# Patient Record
Sex: Female | Born: 1970 | Race: Black or African American | Hispanic: No | Marital: Single | Smoking: Former smoker
Health system: Southern US, Community
[De-identification: ages and names within clinical notes are randomized; demographics above are authoritative.]

## PROBLEM LIST (undated history)

## (undated) DIAGNOSIS — D649 Anemia, unspecified: Secondary | ICD-10-CM

## (undated) DIAGNOSIS — M1990 Unspecified osteoarthritis, unspecified site: Secondary | ICD-10-CM

## (undated) DIAGNOSIS — I10 Essential (primary) hypertension: Secondary | ICD-10-CM

## (undated) DIAGNOSIS — F32A Depression, unspecified: Secondary | ICD-10-CM

## (undated) DIAGNOSIS — R5383 Other fatigue: Secondary | ICD-10-CM

## (undated) DIAGNOSIS — F419 Anxiety disorder, unspecified: Secondary | ICD-10-CM

## (undated) DIAGNOSIS — D219 Benign neoplasm of connective and other soft tissue, unspecified: Secondary | ICD-10-CM

## (undated) DIAGNOSIS — F329 Major depressive disorder, single episode, unspecified: Secondary | ICD-10-CM

## (undated) DIAGNOSIS — B009 Herpesviral infection, unspecified: Secondary | ICD-10-CM

## (undated) HISTORY — PX: REDUCTION MAMMAPLASTY: SUR839

## (undated) HISTORY — PX: ABDOMINAL HYSTERECTOMY: SHX81

## (undated) HISTORY — DX: Major depressive disorder, single episode, unspecified: F32.9

## (undated) HISTORY — DX: Benign neoplasm of connective and other soft tissue, unspecified: D21.9

## (undated) HISTORY — DX: Unspecified osteoarthritis, unspecified site: M19.90

## (undated) HISTORY — DX: Anemia, unspecified: D64.9

## (undated) HISTORY — DX: Herpesviral infection, unspecified: B00.9

## (undated) HISTORY — DX: Depression, unspecified: F32.A

## (undated) HISTORY — DX: Anxiety disorder, unspecified: F41.9

---
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
## 1998-07-06 HISTORY — PX: BURN DEBRIDEMENT SURGERY: SHX582

## 2016-02-15 ENCOUNTER — Emergency Department (HOSPITAL_COMMUNITY)
Admission: EM | Admit: 2016-02-15 | Discharge: 2016-02-15 | Disposition: A | Discharge status: Home / Self Care | Payer: Self-pay | Attending: Emergency Medicine | Admitting: Emergency Medicine

## 2016-02-15 ENCOUNTER — Encounter (HOSPITAL_COMMUNITY): Payer: Self-pay | Admitting: Emergency Medicine

## 2016-02-15 DIAGNOSIS — R600 Localized edema: Secondary | ICD-10-CM | POA: Insufficient documentation

## 2016-02-15 DIAGNOSIS — M79606 Pain in leg, unspecified: Secondary | ICD-10-CM

## 2016-02-15 DIAGNOSIS — I10 Essential (primary) hypertension: Secondary | ICD-10-CM | POA: Insufficient documentation

## 2016-02-15 DIAGNOSIS — R609 Edema, unspecified: Secondary | ICD-10-CM

## 2016-02-15 HISTORY — DX: Essential (primary) hypertension: I10

## 2016-02-15 MED ORDER — FUROSEMIDE 20 MG PO TABS: 20.0000 mg | ORAL_TABLET | Freq: Every day | ORAL | 0 refills | Status: DC

## 2016-02-15 NOTE — Discharge Instructions (Signed)
Eat a low salt diet to help with symptoms. Elevate your legs above the level of your heart to help with swelling. Continue taking your usual home medications, including the meloxicam for pain, and all home blood pressure medications. Use additional tylenol as needed for pain. Take lasix as directed to help with swelling. Follow up with your regular doctor in 3 days for recheck of symptoms and ongoing management of your symptoms. Return to the ER for changes or worsening symptoms.
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
## 2016-02-15 NOTE — ED Triage Notes (Signed)
Patient states that since Wed-Thurs having lower leg pain and having swelling in bilat feet.  Patient recently traveled via airplane.  Patient states that has tried to elevate but still having swelling.

## 2016-03-10 ENCOUNTER — Other Ambulatory Visit (HOSPITAL_COMMUNITY): Payer: Self-pay | Admitting: Nurse Practitioner

## 2016-03-10 ENCOUNTER — Ambulatory Visit (HOSPITAL_COMMUNITY)
Admission: RE | Admit: 2016-03-10 | Discharge: 2016-03-10 | Disposition: A | Discharge status: Home / Self Care | Payer: Self-pay | Source: Ambulatory Visit | Attending: Vascular Surgery | Admitting: Vascular Surgery

## 2016-03-10 DIAGNOSIS — M79661 Pain in right lower leg: Secondary | ICD-10-CM

## 2016-11-03 ENCOUNTER — Other Ambulatory Visit: Payer: Self-pay | Admitting: Nurse Practitioner

## 2016-11-03 ENCOUNTER — Other Ambulatory Visit (HOSPITAL_COMMUNITY)
Admission: RE | Admit: 2016-11-03 | Discharge: 2016-11-03 | Disposition: A | Discharge status: Home / Self Care | Payer: No Typology Code available for payment source | Source: Ambulatory Visit | Attending: Nurse Practitioner | Admitting: Nurse Practitioner

## 2016-11-03 DIAGNOSIS — Z1151 Encounter for screening for human papillomavirus (HPV): Secondary | ICD-10-CM | POA: Insufficient documentation

## 2016-11-03 DIAGNOSIS — Z01419 Encounter for gynecological examination (general) (routine) without abnormal findings: Secondary | ICD-10-CM | POA: Diagnosis present

## 2016-11-05 LAB — CYTOLOGY - PAP
Diagnosis: NEGATIVE
HPV: DETECTED — AB

## 2017-09-08 ENCOUNTER — Other Ambulatory Visit: Payer: Self-pay | Admitting: Nurse Practitioner

## 2017-09-08 DIAGNOSIS — Z1231 Encounter for screening mammogram for malignant neoplasm of breast: Secondary | ICD-10-CM

## 2017-10-05 ENCOUNTER — Ambulatory Visit
Admission: RE | Admit: 2017-10-05 | Discharge: 2017-10-05 | Disposition: A | Discharge status: Home / Self Care | Payer: BC Managed Care – PPO | Source: Ambulatory Visit | Attending: Nurse Practitioner | Admitting: Nurse Practitioner

## 2017-10-05 DIAGNOSIS — Z1231 Encounter for screening mammogram for malignant neoplasm of breast: Secondary | ICD-10-CM

## 2017-10-06 ENCOUNTER — Ambulatory Visit: Payer: No Typology Code available for payment source

## 2017-10-18 ENCOUNTER — Emergency Department (HOSPITAL_BASED_OUTPATIENT_CLINIC_OR_DEPARTMENT_OTHER): Payer: BC Managed Care – PPO

## 2017-10-18 ENCOUNTER — Encounter (HOSPITAL_BASED_OUTPATIENT_CLINIC_OR_DEPARTMENT_OTHER): Payer: Self-pay | Admitting: Emergency Medicine

## 2017-10-18 ENCOUNTER — Emergency Department (HOSPITAL_BASED_OUTPATIENT_CLINIC_OR_DEPARTMENT_OTHER)
Admission: EM | Admit: 2017-10-18 | Discharge: 2017-10-18 | Disposition: A | Discharge status: Home / Self Care | Payer: BC Managed Care – PPO | Attending: Emergency Medicine | Admitting: Emergency Medicine

## 2017-10-18 ENCOUNTER — Other Ambulatory Visit: Payer: Self-pay

## 2017-10-18 DIAGNOSIS — Y929 Unspecified place or not applicable: Secondary | ICD-10-CM | POA: Diagnosis not present

## 2017-10-18 DIAGNOSIS — I10 Essential (primary) hypertension: Secondary | ICD-10-CM | POA: Insufficient documentation

## 2017-10-18 DIAGNOSIS — Y9389 Activity, other specified: Secondary | ICD-10-CM | POA: Diagnosis not present

## 2017-10-18 DIAGNOSIS — S301XXA Contusion of abdominal wall, initial encounter: Secondary | ICD-10-CM | POA: Insufficient documentation

## 2017-10-18 DIAGNOSIS — Y998 Other external cause status: Secondary | ICD-10-CM | POA: Insufficient documentation

## 2017-10-18 DIAGNOSIS — S3991XA Unspecified injury of abdomen, initial encounter: Secondary | ICD-10-CM | POA: Diagnosis present

## 2017-10-18 LAB — COMPREHENSIVE METABOLIC PANEL
ALT: 16 U/L (ref 14–54)
AST: 23 U/L (ref 15–41)
Albumin: 4.2 g/dL (ref 3.5–5.0)
Alkaline Phosphatase: 47 U/L (ref 38–126)
Anion gap: 10 (ref 5–15)
BUN: 10 mg/dL (ref 6–20)
CO2: 25 mmol/L (ref 22–32)
Calcium: 9.2 mg/dL (ref 8.9–10.3)
Chloride: 104 mmol/L (ref 101–111)
Creatinine, Ser: 0.79 mg/dL (ref 0.44–1.00)
GFR calc Af Amer: >60 mL/min (ref >60)
GFR calc non Af Amer: >60 mL/min (ref >60)
Glucose, Bld: 103 mg/dL — ABNORMAL HIGH (ref 65–99)
Potassium: 3.7 mmol/L (ref 3.5–5.1)
Sodium: 139 mmol/L (ref 135–145)
Total Bilirubin: 0.9 mg/dL (ref 0.3–1.2)
Total Protein: 7.5 g/dL (ref 6.5–8.1)

## 2017-10-18 LAB — URINALYSIS, MICROSCOPIC (REFLEX)

## 2017-10-18 LAB — URINALYSIS, ROUTINE W REFLEX MICROSCOPIC
Bilirubin Urine: NEGATIVE
Glucose, UA: NEGATIVE mg/dL
Ketones, ur: NEGATIVE mg/dL
Leukocytes, UA: NEGATIVE
Nitrite: NEGATIVE
Protein, ur: NEGATIVE mg/dL
Specific Gravity, Urine: 1.02 (ref 1.005–1.030)
pH: 6 (ref 5.0–8.0)

## 2017-10-18 LAB — PREGNANCY, URINE: Preg Test, Ur: NEGATIVE

## 2017-10-18 MED ORDER — NAPROXEN 500 MG PO TABS: 500.0000 mg | ORAL_TABLET | Freq: Two times a day (BID) | ORAL | 0 refills | Status: DC

## 2017-10-18 MED ORDER — METHOCARBAMOL 500 MG PO TABS: 500.0000 mg | ORAL_TABLET | Freq: Two times a day (BID) | ORAL | 0 refills | Status: DC

## 2017-10-18 MED ORDER — METHOCARBAMOL 500 MG PO TABS
500.0000 mg | ORAL_TABLET | Freq: Once | ORAL | Status: AC
  Administered 2017-10-18: 500 mg via ORAL

## 2017-10-18 MED ORDER — METHOCARBAMOL 500 MG PO TABS
ORAL_TABLET | ORAL | Status: AC
  Filled 2017-10-18: qty 1

## 2017-10-18 MED ORDER — IOPAMIDOL (ISOVUE-300) INJECTION 61%
100.0000 mL | Freq: Once | INTRAVENOUS | Status: AC | PRN
  Administered 2017-10-18: 100 mL via INTRAVENOUS
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
## 2017-10-18 NOTE — ED Notes (Signed)
Patient transported to CT 
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
## 2017-10-18 NOTE — ED Notes (Signed)
ED Provider at bedside. 
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
## 2018-01-18 ENCOUNTER — Other Ambulatory Visit: Payer: Self-pay | Admitting: Nurse Practitioner

## 2018-06-10 NOTE — Progress Notes (Signed)
Assessment and Plan:  [place] was seen today for establish care and mass.  Diagnoses and all orders for this visit:   Encounter to establish care with new doctor  Hypertension, unspecified type Monitor blood pressure at home; call if consistently over 130/80 Continue DASH diet.   Reminder to go to the ER if any CP, SOB, nausea, dizziness, severe HA, changes vision/speech, left arm numbness and tingling and jaw pain.  Uterine leiomyoma, unspecified location Followed by GYN, discussing surgery  Vitamin D deficiency Continue supplementation Check vitamin D level at next OV  Morbid obesity with BMI of 50.0-59.9, adult ([place]) Long discussion about weight loss, diet, and exercise Recommended diet heavy in fruits and veggies and low in animal meats, cheeses, and dairy products, appropriate calorie intake Patient will work on working with Physiological scientist, portion controll Discussed appropriate weight for height  Follow up at next visit  Mass of soft tissue of LLE [place] simple lipoma vs cellulitis, no signs of infection or distinct lesion, will obtain soft tissue ultrasound to determine  Patient reassured, monitor, biopsy/surgical excision if further concern  Further disposition pending results of labs. Discussed med's effects and SE's.   Over 30 minutes of exam, counseling, chart review, and critical decision making was performed.   Future Appointments  Date Time Provider [place]  12/14/2018  3:45 PM Liane Comber, NP [facility] None    ------------------------------------------------------------------------------------------------------------------   HPI BP 122/78   Pulse 84   Temp (!) 97.3 F (36.3 C)   Ht 5' 5.25" (1.657 m)   Wt (!) 322 lb 6.4 oz (146.2 kg)   SpO2 97%   BMI 53.24 kg/m   47 y.o.female presents to establish care as she is new to this office, moved from [place], has been with [facility] (Dr. Homero Fellers), seeing GYN NP at [facility], and for  evaluation of acute complaint of lump to thigh that she noted approx 6 weeks ago, was pointed out to her by her massage therapist and personal trainer (currently working on weight loss). She denies tenderness, changes in sensation, changes in mobility.   She is on treatment for htn, depression/anxiety (has been well managed with PRN xanax. Has been on daily agents on and off, but hasn't been on in the past 7-8 years. She has been on zoloft in the past, has also taken wellbutrin in the past, but feels she is doing well without this.  She is a Occupational hygienist for PhD students at [employer];   Last CPE was 03/2018. Reports labwork was unremakable.    Her blood pressure has been controlled at home, today their BP is BP: 122/78  She does workout. She denies chest pain, shortness of breath, dizziness.  Patient is on Vitamin D supplement, taking ? 1000 IU daily, unsure of dose No results found for: VD25OH    BMI is Body mass index is 53.24 kg/m., she has been working on diet and exercise. Wt Readings from Last 3 Encounters:  06/13/18 (!) 322 lb 6.4 oz (146.2 kg)  10/18/17 300 lb (136.1 kg)  02/15/16 (!) 320 lb (145.2 kg)     Past Medical History:  Diagnosis Date  . Anxiety and depression   . Herpes   . Hypertension      No Known Allergies  Current Outpatient Medications on File Prior to Visit  Medication Sig  . ALPRAZolam (XANAX) 0.5 MG tablet Take 0.5 mg by mouth as needed for anxiety.  Marland Kitchen amLODipine (NORVASC) 2.5 MG tablet Take 2.5  mg by mouth daily.  . CHOLECALCIFEROL PO Take 1,000 Units by mouth daily.  Marland Kitchen losartan (COZAAR) 25 MG tablet Take 25 mg by mouth daily.   No current facility-administered medications on file prior to visit.     ROS: Review of Systems  Constitutional: Negative for malaise/fatigue and weight loss.  HENT: Negative for hearing loss and tinnitus.   Eyes: Negative for blurred vision and double vision.  Respiratory: Negative for cough, shortness of breath  and wheezing.   Cardiovascular: Negative for chest pain, palpitations, orthopnea, claudication and leg swelling.  Gastrointestinal: Negative for abdominal pain, blood in stool, constipation, diarrhea, heartburn, melena, nausea and vomiting.  Genitourinary: Negative.   Musculoskeletal: Negative for joint pain and myalgias.  Skin: Negative for rash.  Neurological: Negative for dizziness, tingling, sensory change, weakness and headaches.  Endo/Heme/Allergies: Negative for polydipsia.  Psychiatric/Behavioral: Negative.   All other systems reviewed and are negative.   Physical Exam:  BP 122/78   Pulse 84   Temp (!) 97.3 F (36.3 C)   Ht 5' 5.25" (1.657 m)   Wt (!) 322 lb 6.4 oz (146.2 kg)   SpO2 97%   BMI 53.24 kg/m   General Appearance: Well nourished, morbidly obese, in no apparent distress. Eyes: PERRLA, EOMs, conjunctiva no swelling or erythema Sinuses: No Frontal/maxillary tenderness ENT/Mouth: Ext aud canals clear, TMs without erythema, bulging. No erythema, swelling, or exudate on post pharynx.  Tonsils not swollen or erythematous. Hearing normal.  Neck: Supple, thyroid normal.  Respiratory: Respiratory effort normal, BS equal bilaterally without rales, rhonchi, wheezing or stridor.  Cardio: RRR with no MRGs. Brisk peripheral pulses without edema.  Abdomen: Soft, obese abdomen, + BS.  Non tender, no guarding, rebound, hernias, masses. Lymphatics: Non tender without lymphadenopathy.  Musculoskeletal: Full ROM, 5/5 strength, normal gait. Soft tissue growth/enlargement without erythema/discharge, mildly tender, non-distinct border approx 5cm x 8 cm area to left lateral thigh Skin: Warm, dry without rashes, lesions, ecchymosis.  Neuro: Cranial nerves intact. Normal muscle tone, no cerebellar symptoms. Sensation intact.  Psych: Awake and oriented X 3, normal affect, Insight and Judgment appropriate.     [name], NP 3:26 PM [facility]

## 2018-06-13 ENCOUNTER — Encounter: Payer: Self-pay | Admitting: Adult Health

## 2018-06-13 ENCOUNTER — Ambulatory Visit: Payer: BC Managed Care – PPO | Admitting: Adult Health

## 2018-06-13 VITALS — BP 122/78 | HR 84 | Temp 97.3°F | Ht 65.25 in | Wt 322.4 lb

## 2018-06-13 DIAGNOSIS — I10 Essential (primary) hypertension: Secondary | ICD-10-CM | POA: Insufficient documentation

## 2018-06-13 DIAGNOSIS — E559 Vitamin D deficiency, unspecified: Secondary | ICD-10-CM | POA: Diagnosis not present

## 2018-06-13 DIAGNOSIS — Z7689 Persons encountering health services in other specified circumstances: Secondary | ICD-10-CM | POA: Diagnosis not present

## 2018-06-13 DIAGNOSIS — D259 Leiomyoma of uterus, unspecified: Secondary | ICD-10-CM | POA: Diagnosis not present

## 2018-06-13 DIAGNOSIS — M7989 Other specified soft tissue disorders: Secondary | ICD-10-CM

## 2018-06-13 DIAGNOSIS — R2242 Localized swelling, mass and lump, left lower limb: Secondary | ICD-10-CM

## 2018-06-13 DIAGNOSIS — Z6841 Body Mass Index (BMI) 40.0 and over, adult: Secondary | ICD-10-CM

## 2018-06-13 HISTORY — DX: Leiomyoma of uterus, unspecified: D25.9

## 2018-06-13 NOTE — Patient Instructions (Signed)
Lipoma A lipoma is a noncancerous (benign) tumor that is made up of fat cells. This is a very common type of soft-tissue growth. Lipomas are usually found under the skin (subcutaneous). They may occur in any tissue of the body that contains fat. Common areas for lipomas to appear include the back, shoulders, buttocks, and thighs. Lipomas grow slowly, and they are usually painless. Most lipomas do not cause problems and do not require treatment. What are the causes? The cause of this condition is not known. What increases the risk? This condition is more likely to develop in:  People who are 40-60 years old.  People who have a family history of lipomas.  What are the signs or symptoms? A lipoma usually appears as a small, round bump under the skin. It may feel soft or rubbery, but the firmness can vary. Most lipomas are not painful. However, a lipoma may become painful if it is located in an area where it pushes on nerves. How is this diagnosed? A lipoma can usually be diagnosed with a physical exam. You may also have tests to confirm the diagnosis and to rule out other conditions. Tests may include:  Imaging tests, such as a CT scan or MRI.  Removal of a tissue sample to be looked at under a microscope (biopsy).  How is this treated? Treatment is not needed for small lipomas that are not causing problems. If a lipoma continues to get bigger or it causes problems, removal is often the best option. Lipomas can also be removed to improve appearance. Removal of a lipoma is usually done with a surgery in which the fatty cells and the surrounding capsule are removed. Most often, a medicine that numbs the area (local anesthetic) is used for this procedure. Follow these instructions at home:  Keep all follow-up visits as directed by your health care provider. This is important. Contact a health care provider if:  Your lipoma becomes larger or hard.  Your lipoma becomes painful, red, or  increasingly swollen. These could be signs of infection or a more serious condition. This information is not intended to replace advice given to you by your health care provider. Make sure you discuss any questions you have with your health care provider. Document Released: 06/12/2002 Document Revised: 11/28/2015 Document Reviewed: 06/18/2014 Elsevier Interactive Patient Education  2018 Elsevier Inc.  

## 2018-06-16 ENCOUNTER — Telehealth: Payer: Self-pay

## 2018-06-16 NOTE — Telephone Encounter (Signed)
Patient is requesting a referral to have Colonoscopy done.

## 2018-06-17 NOTE — Telephone Encounter (Signed)
Patient notified and is going to call her insurance company first and will let us know

## 2018-07-01 ENCOUNTER — Telehealth: Payer: Self-pay | Admitting: *Deleted

## 2018-07-01 NOTE — Telephone Encounter (Signed)
The 1st part of Dec patient had discussed getting a colonoscopy with [facility]. Patient was to call her insurance & get back to [place]. Patient wants to get this done. Says it is fully covered

## 2018-07-04 ENCOUNTER — Other Ambulatory Visit: Payer: Self-pay | Admitting: Adult Health

## 2018-07-04 DIAGNOSIS — Z1211 Encounter for screening for malignant neoplasm of colon: Secondary | ICD-10-CM

## 2018-07-07 ENCOUNTER — Telehealth: Payer: Self-pay | Admitting: Adult Health

## 2018-07-07 NOTE — Telephone Encounter (Signed)
error 

## 2018-07-08 ENCOUNTER — Telehealth: Payer: Self-pay | Admitting: Adult Health

## 2018-07-08 ENCOUNTER — Encounter: Payer: Self-pay | Admitting: Adult Health

## 2018-07-08 ENCOUNTER — Encounter: Payer: Self-pay | Admitting: Internal Medicine

## 2018-07-08 ENCOUNTER — Telehealth: Payer: Self-pay | Admitting: *Deleted

## 2018-07-08 ENCOUNTER — Other Ambulatory Visit: Payer: Self-pay | Admitting: Adult Health

## 2018-07-08 DIAGNOSIS — M7989 Other specified soft tissue disorders: Secondary | ICD-10-CM

## 2018-07-08 DIAGNOSIS — R2240 Localized swelling, mass and lump, unspecified lower limb: Principal | ICD-10-CM

## 2018-07-08 MED ORDER — DICLOFENAC EPOLAMINE 1.3 % TD PTCH: 1.0000 | MEDICATED_PATCH | Freq: Two times a day (BID) | TRANSDERMAL | 2 refills | Status: DC

## 2018-07-08 NOTE — Telephone Encounter (Signed)
spoke with patient about [place] result, she does state that the area is pain at times and numb. She would like to have an MRI to investigate further. She also states she used a piece of  a Flector patch on lipoma with relief. requesting rx. Please advise your recommendations.

## 2018-07-08 NOTE — Progress Notes (Signed)
New patient to our office first seen on 06/13/2018 with poorly localized tender soft tissue mass to left lateral thigh persisting for over 8 weeks, unclear etiology on exam in office and was pursued by [place] on 12/16 which found  Normal appearance of subcutaneous fat and underlying fascial margins with no evidence for focal mass, cystic region, nor shadowing to suggest calcification. Visualized muscle belly fibers are normal. No focal fluid collection. Recommendation from radiologist was to consider MRI if symptoms persisted; patient reports intermittent localized pain and numbness and wishing to pursue MRI at this time. She reported flector patches as helpful for comfort and this was ordered per her request. She was called to discuss, and advised she may alternately try OTC lidocaine patches if needed. If MRI not cleared by insurance, will alternately refer to general surgery for evaluation.

## 2018-07-08 NOTE — Telephone Encounter (Signed)
Pt is calling asking results of u/s on thigh  has not gotten a call    Also asking for maybe a flector patch had 1 used it on spot said it helped asking for a rx?    [facility]  [address]

## 2018-07-18 ENCOUNTER — Telehealth: Payer: Self-pay | Admitting: *Deleted

## 2018-07-18 NOTE — Telephone Encounter (Signed)
Patient is scheduled for a direct screening colonoscopy. Referred by her GYN. Her last BMI was on 06/13/2018=53.26, & last weight 322lbs. Hx of HTN and Uterine fibroid. Ok for direct hospital colonoscopy due to BMI over 50 or Office visit 1st? Please advise. Thank you, Robbin pv

## 2018-07-18 NOTE — Telephone Encounter (Signed)
[place] for direct to hospital procedure for screening

## 2018-07-18 NOTE — Telephone Encounter (Signed)
Noted  

## 2018-07-19 ENCOUNTER — Other Ambulatory Visit: Payer: Self-pay

## 2018-07-19 DIAGNOSIS — Z1211 Encounter for screening for malignant neoplasm of colon: Secondary | ICD-10-CM

## 2018-07-19 NOTE — Telephone Encounter (Signed)
Pt scheduled for colon at [facility] 08/15/18@8 :30am, case [id number]. Please let pt know of new appt date and time at previsit.

## 2018-07-19 NOTE — Telephone Encounter (Signed)
Patient notified of [facility] appointment for 2/10 at 830 am. Patient also aware of her PV appointment on 07/29/2018.

## 2018-07-29 ENCOUNTER — Ambulatory Visit (AMBULATORY_SURGERY_CENTER): Payer: BC Managed Care – PPO | Admitting: *Deleted

## 2018-07-29 VITALS — Ht 65.0 in | Wt 317.0 lb

## 2018-07-29 DIAGNOSIS — Z1211 Encounter for screening for malignant neoplasm of colon: Secondary | ICD-10-CM

## 2018-07-29 MED ORDER — NA SULFATE-K SULFATE-MG SULF 17.5-3.13-1.6 GM/177ML PO SOLN: ORAL | 0 refills | Status: DC

## 2018-07-29 NOTE — Progress Notes (Signed)
Patient denies any allergies to eggs or soy. Patient denies any problems with anesthesia/sedation. Patient denies any oxygen use at home. Patient is taking Phentermine but has already stopped this medication and is aware not take any until after colonoscopy. Patient denies taking any blood thinners. EMMI education offered, pt declined. Colonoscopy handout given.

## 2018-08-12 ENCOUNTER — Encounter: Payer: BC Managed Care – PPO | Admitting: Internal Medicine

## 2018-08-14 ENCOUNTER — Encounter (HOSPITAL_COMMUNITY): Payer: Self-pay | Admitting: Anesthesiology

## 2018-08-14 NOTE — Anesthesia Preprocedure Evaluation (Addendum)
Anesthesia Evaluation  Patient identified by MRN, date of birth, ID band Patient awake    Reviewed: Allergy & Precautions, NPO status , Patient's Chart, lab work & pertinent test results  Airway Mallampati: II  TM Distance: >3 FB Neck ROM: Full    Dental no notable dental hx. (+) Teeth Intact   Pulmonary neg pulmonary ROS,    Pulmonary exam normal breath sounds clear to auscultation       Cardiovascular hypertension, Pt. on medications Normal cardiovascular exam Rhythm:Regular Rate:Normal     Neuro/Psych PSYCHIATRIC DISORDERS Anxiety Depression negative neurological ROS     GI/Hepatic Neg liver ROS, For screening colonoscopy   Endo/Other  Morbid obesity  Renal/GU negative Renal ROS  negative genitourinary   Musculoskeletal negative musculoskeletal ROS (+)   Abdominal (+) + obese,   Peds  Hematology  (+) anemia ,   Anesthesia Other Findings   Reproductive/Obstetrics HSV                            Anesthesia Physical Anesthesia Plan  ASA: III  Anesthesia Plan: MAC   Post-op Pain Management:    Induction:   PONV Risk Score and Plan: 2 and Propofol infusion, Ondansetron and Treatment may vary due to age or medical condition  Airway Management Planned: Natural Airway and Simple Face Mask  Additional Equipment:   Intra-op Plan:   Post-operative Plan:   Informed Consent: I have reviewed the patients History and Physical, chart, labs and discussed the procedure including the risks, benefits and alternatives for the proposed anesthesia with the patient or authorized representative who has indicated his/her understanding and acceptance.     Dental advisory given  Plan Discussed with: CRNA and Surgeon  Anesthesia Plan Comments:        Anesthesia Quick Evaluation

## 2018-08-15 ENCOUNTER — Ambulatory Visit (HOSPITAL_COMMUNITY): Payer: BC Managed Care – PPO | Admitting: Anesthesiology

## 2018-08-15 ENCOUNTER — Encounter (HOSPITAL_COMMUNITY)
Admission: RE | Disposition: A | Discharge status: Home / Self Care | Payer: Self-pay | Source: Home / Self Care | Attending: Internal Medicine

## 2018-08-15 ENCOUNTER — Other Ambulatory Visit: Payer: Self-pay

## 2018-08-15 ENCOUNTER — Ambulatory Visit (HOSPITAL_COMMUNITY)
Admission: RE | Admit: 2018-08-15 | Discharge: 2018-08-15 | Disposition: A | Discharge status: Home / Self Care | Payer: BC Managed Care – PPO | Attending: Internal Medicine | Admitting: Internal Medicine

## 2018-08-15 ENCOUNTER — Encounter (HOSPITAL_COMMUNITY): Payer: Self-pay | Admitting: Anesthesiology

## 2018-08-15 DIAGNOSIS — E669 Obesity, unspecified: Secondary | ICD-10-CM | POA: Insufficient documentation

## 2018-08-15 DIAGNOSIS — K5730 Diverticulosis of large intestine without perforation or abscess without bleeding: Secondary | ICD-10-CM

## 2018-08-15 DIAGNOSIS — F329 Major depressive disorder, single episode, unspecified: Secondary | ICD-10-CM | POA: Insufficient documentation

## 2018-08-15 DIAGNOSIS — Z6841 Body Mass Index (BMI) 40.0 and over, adult: Secondary | ICD-10-CM | POA: Insufficient documentation

## 2018-08-15 DIAGNOSIS — F419 Anxiety disorder, unspecified: Secondary | ICD-10-CM | POA: Insufficient documentation

## 2018-08-15 DIAGNOSIS — I10 Essential (primary) hypertension: Secondary | ICD-10-CM | POA: Insufficient documentation

## 2018-08-15 DIAGNOSIS — Z1211 Encounter for screening for malignant neoplasm of colon: Secondary | ICD-10-CM | POA: Diagnosis present

## 2018-08-15 HISTORY — PX: COLONOSCOPY WITH PROPOFOL: SHX5780

## 2018-08-15 SURGERY — COLONOSCOPY WITH PROPOFOL
Anesthesia: Monitor Anesthesia Care

## 2018-08-15 MED ORDER — LIDOCAINE HCL 1 % IJ SOLN
INTRAMUSCULAR | Status: DC | PRN
  Administered 2018-08-15: 30 mg via INTRADERMAL

## 2018-08-15 MED ORDER — LACTATED RINGERS IV SOLN
INTRAVENOUS | Status: DC | PRN
  Administered 2018-08-15: 07:00:00 via INTRAVENOUS

## 2018-08-15 MED ORDER — PROPOFOL 10 MG/ML IV BOLUS
INTRAVENOUS | Status: AC
  Filled 2018-08-15: qty 40

## 2018-08-15 MED ORDER — PROPOFOL 10 MG/ML IV BOLUS
INTRAVENOUS | Status: DC | PRN
  Administered 2018-08-15: 20 mg via INTRAVENOUS
  Administered 2018-08-15: 10 mg via INTRAVENOUS
  Administered 2018-08-15: 20 mg via INTRAVENOUS
  Administered 2018-08-15: 10 mg via INTRAVENOUS
  Administered 2018-08-15 (×5): 20 mg via INTRAVENOUS
  Administered 2018-08-15: 50 mg via INTRAVENOUS
  Administered 2018-08-15 (×3): 20 mg via INTRAVENOUS
  Administered 2018-08-15: 10 mg via INTRAVENOUS

## 2018-08-15 MED ORDER — SODIUM CHLORIDE 0.9 % IV SOLN: INTRAVENOUS | Status: DC

## 2018-08-15 MED ORDER — LACTATED RINGERS IV SOLN: INTRAVENOUS | Status: DC

## 2018-08-15 SURGICAL SUPPLY — 21 items

## 2018-08-15 NOTE — Transfer of Care (Signed)
Immediate Anesthesia Transfer of Care Note  Patient: Mary Banks  Procedure(s) Performed: COLONOSCOPY WITH PROPOFOL (N/A )  Patient Location: PACU and Endoscopy Unit  Anesthesia Type:MAC  Level of Consciousness: awake, alert  and oriented  Airway & Oxygen Therapy: Patient Spontanous Breathing and Patient connected to face mask oxygen  Post-op Assessment: Report given to RN and Post -op Vital signs reviewed and stable  Post vital signs: Reviewed and stable  Last Vitals:  Vitals Value Taken Time  BP 113/66 08/15/2018  9:13 AM  Temp    Pulse 76 08/15/2018  9:14 AM  Resp 16 08/15/2018  9:14 AM  SpO2 100 % 08/15/2018  9:14 AM  Vitals shown include unvalidated device data.  Last Pain:  Vitals:   08/15/18 0732  TempSrc: Oral  PainSc: 0-No pain         Complications: No apparent anesthesia complications

## 2018-08-15 NOTE — Op Note (Signed)
[facility] Patient Name: Mary Banks Procedure Date: 08/15/2018 MRN: [id number] Attending MD: Jerene Bears , MD Date of Birth: 12-03-1970 CSN: [id number] Age: 48 Admit Type: Outpatient Procedure:                Colonoscopy Indications:              Screening for colorectal malignant neoplasm, This                            is the patient's first colonoscopy Providers:                Lajuan Lines. Hilarie Fredrickson, MD, Zenon Mayo, RN, Cherylynn Ridges,                            Technician, Karis Juba, CRNA Referring MD:             Cherlyn Roberts, MD Medicines:                Monitored Anesthesia Care Complications:            No immediate complications. Estimated Blood Loss:     Estimated blood loss: none. Procedure:                Pre-Anesthesia Assessment:                           - Prior to the procedure, a History and Physical                            was performed, and patient medications and                            allergies were reviewed. The patient's tolerance of                            previous anesthesia was also reviewed. The risks                            and benefits of the procedure and the sedation                            options and risks were discussed with the patient.                            All questions were answered, and informed consent                            was obtained. Prior Anticoagulants: The patient has                            taken no previous anticoagulant or antiplatelet                            agents. ASA Grade Assessment: III - A patient with  severe systemic disease. After reviewing the risks                            and benefits, the patient was deemed in                            satisfactory condition to undergo the procedure.                           After obtaining informed consent, the colonoscope                            was passed under direct vision. Throughout the                    procedure, the patient's blood pressure, pulse, and                            oxygen saturations were monitored continuously. The                            CF-HQ190L ([id number]) Olympus colonoscope was                            introduced through the anus and advanced to the                            cecum, identified by appendiceal orifice and                            ileocecal valve. The colonoscopy was performed                            without difficulty. The patient tolerated the                            procedure well. The quality of the bowel                            preparation was excellent. The ileocecal valve,                            appendiceal orifice, and rectum were photographed. Scope In: 8:55:29 AM Scope Out: 9:06:26 AM Scope Withdrawal Time: 0 hours 6 minutes 26 seconds  Total Procedure Duration: 0 hours 10 minutes 57 seconds  Findings:      The digital rectal exam was normal.      A few small-mouthed diverticula were found in the sigmoid colon.      The exam was otherwise without abnormality on direct and retroflexion       views. Impression:               - Diverticulosis in the sigmoid colon.                           - The examination was otherwise normal on direct  and retroflexion views. No polyps seen.                           - No specimens collected. Moderate Sedation:      N/A Recommendation:           - Patient has a contact number available for                            emergencies. The signs and symptoms of potential                            delayed complications were discussed with the                            patient. Return to normal activities tomorrow.                            Written discharge instructions were provided to the                            patient.                           - Resume previous diet.                           - Continue present medications.                            - Repeat colonoscopy in 10 years for screening                            purposes. Procedure Code(s):        --- Professional ---                           G8676, Colorectal cancer screening; colonoscopy on                            individual not meeting criteria for high risk Diagnosis Code(s):        --- Professional ---                           Z12.11, Encounter for screening for malignant                            neoplasm of colon                           K57.30, Diverticulosis of large intestine without                            perforation or abscess without bleeding CPT copyright 2018 American Medical Association. All rights reserved. The codes documented in this report are preliminary and upon coder review may  be revised to meet current compliance requirements. Jerene Bears, MD 08/15/2018 9:16:24  AM This report has been signed electronically. Number of Addenda: 0

## 2018-08-15 NOTE — Anesthesia Postprocedure Evaluation (Signed)
Anesthesia Post Note  Patient: Mary Banks  Procedure(s) Performed: COLONOSCOPY WITH PROPOFOL (N/A )     Patient location during evaluation: PACU Anesthesia Type: MAC Level of consciousness: awake and alert and oriented Pain management: pain level controlled Vital Signs Assessment: post-procedure vital signs reviewed and stable Respiratory status: spontaneous breathing, nonlabored ventilation and respiratory function stable Cardiovascular status: stable and blood pressure returned to baseline Postop Assessment: no apparent nausea or vomiting Anesthetic complications: no    Last Vitals:  Vitals:   08/15/18 0915 08/15/18 0925  BP: 113/66 106/66  Pulse: 76 73  Resp: 16 13  Temp: (!) 36.4 C   SpO2: 100% 100%    Last Pain:  Vitals:   08/15/18 0925  TempSrc:   PainSc: 0-No pain                 FOSTER,MICHAEL A.

## 2018-08-15 NOTE — Discharge Instructions (Addendum)
YOU HAD AN ENDOSCOPIC PROCEDURE TODAY: Refer to the procedure report and other information in the discharge instructions given to you for any specific questions about what was found during the examination. If this information does not answer your questions, please call [facility] office at [phone] to clarify.   YOU SHOULD EXPECT: Some feelings of bloating in the abdomen. Passage of more gas than usual. Walking can help get rid of the air that was put into your GI tract during the procedure and reduce the bloating. If you had a lower endoscopy (such as a colonoscopy or flexible sigmoidoscopy) you may notice spotting of blood in your stool or on the toilet paper. Some abdominal soreness may be present for a day or two, also.  DIET: Your first meal following the procedure should be a light meal and then it is ok to progress to your normal diet. A half-sandwich or bowl of soup is an example of a good first meal. Heavy or fried foods are harder to digest and may make you feel nauseous or bloated. Drink plenty of fluids but you should avoid alcoholic beverages for 24 hours. If you had a esophageal dilation, please see attached instructions for diet.    ACTIVITY: Your care partner should take you home directly after the procedure. You should plan to take it easy, moving slowly for the rest of the day. You can resume normal activity the day after the procedure however YOU SHOULD NOT DRIVE, use power tools, machinery or perform tasks that involve climbing or major physical exertion for 24 hours (because of the sedation medicines used during the test).   SYMPTOMS TO REPORT IMMEDIATELY: A gastroenterologist can be reached at any hour. Please call [phone]  for any of the following symptoms:  Following lower endoscopy (colonoscopy, flexible sigmoidoscopy) Excessive amounts of blood in the stool  Significant tenderness, worsening of abdominal pains  Swelling of the abdomen that is new, acute  Fever of 100 or  higher  Following upper endoscopy (EGD, EUS, ERCP, esophageal dilation) Vomiting of blood or coffee ground material  New, significant abdominal pain  New, significant chest pain or pain under the shoulder blades  Painful or persistently difficult swallowing  New shortness of breath  Black, tarry-looking or red, bloody stools  FOLLOW UP:  If any biopsies were taken you will be contacted by phone or by letter within the next 1-3 weeks. Call [phone]  if you have not heard about the biopsies in 3 weeks.  Please also call with any specific questions about appointments or follow up tests. YOU HAD AN ENDOSCOPIC PROCEDURE TODAY: Refer to the procedure report that was given to you for any specific questions about what was found during the examination.  If the procedure report does not answer your questions, please call your gastroenterologist to clarify.  YOU SHOULD EXPECT: Some feelings of bloating in the abdomen. Passage of more gas than usual.  Walking can help get rid of the air that was put into your GI tract during the procedure and reduce the bloating. If you had a lower endoscopy (such as a colonoscopy or flexible sigmoidoscopy) you may notice spotting of blood in your stool or on the toilet paper.   DIET: Your first meal following the procedure should be a light meal and then it is ok to progress to your normal diet.  A half-sandwich or bowl of soup is an example of a good first meal.  Heavy or fried foods are harder to digest and may  make you feel nasueas or bloated.  Drink plenty of fluids but you should avoid alcoholic beverages for 24 hours.  ACTIVITY: Your care partner should take you home directly after the procedure.  You should plan to take it easy, moving slowly for the rest of the day.  You can resume normal activity the day after the procedure however you should NOT DRIVE or use heavy machinery for 24 hours (because of the sedation medicines used during the test).    SYMPTOMS TO  REPORT IMMEDIATELY  A gastroenterologist can be reached at any hour.  Please call your doctor's office for any of the following symptoms:   Following lower endoscopy (colonoscopy, flexible sigmoidoscopy)  Excessive amounts of blood in the stool  Significant tenderness, worsening of abdominal pains  Swelling of the abdomen that is new, acute  Fever of 100 or higher  Following upper endoscopy (EGD, EUS, ERCP)  Vomiting of blood or coffee ground material  New, significant abdominal pain  New, significant chest pain or pain under the shoulder blades  Painful or persistently difficult swallowing  New shortness of breath  Black, tarry-looking stools  FOLLOW UP: If any biopsies were taken you will be contacted by phone or by letter within the next 1-3 weeks.  Call your gastroenterologist if you have not heard about the biopsies in 3 weeks.  Please also call your gastroenterologist's office with any specific questions about appointments or follow up tests.
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
## 2018-08-23 ENCOUNTER — Telehealth: Payer: Self-pay

## 2018-08-23 ENCOUNTER — Other Ambulatory Visit: Payer: Self-pay | Admitting: Adult Health

## 2018-08-23 ENCOUNTER — Telehealth: Payer: Self-pay | Admitting: Internal Medicine

## 2018-08-23 MED ORDER — FLUCONAZOLE 150 MG PO TABS: ORAL_TABLET | ORAL | 1 refills | Status: DC

## 2018-08-23 NOTE — Telephone Encounter (Signed)
Pt had colon done last week. States the day after the colon she was very energetic and did her work out, this was on Tuesday. By Wednesday evening she was back on her 1500 calorie diet. Reports over the weekend she felt ok but yesterday and today she has felt very tired and low energy. Pt wanted to know if Dr. Hilarie Fredrickson feels this is related to her procedure. Reports she did start her period on Sunday but is has been normal for her, not very heavy. Please advise.

## 2018-08-23 NOTE — Telephone Encounter (Signed)
Spoke with pt and she is aware.

## 2018-08-23 NOTE — Telephone Encounter (Signed)
Patient aware of rx being sent in and plans on calling Dr. Vena Rua office

## 2018-08-23 NOTE — Telephone Encounter (Signed)
Patient had a colonoscopy recently and developed a yeast infection since the procedure. Has also been feverish and really tired since the procedure, feeling a little better today. Patient would like to know if symptoms are normal and would like a prescription for the yeast infection.

## 2018-08-23 NOTE — Telephone Encounter (Signed)
I would not think this is related to her procedure.  If she continues to feel not like herself I would recommend that she see her PCP

## 2018-08-23 NOTE — Telephone Encounter (Signed)
Pt had colonoscopy 2.10.20.  Pt reported having no energy.  Please advise.

## 2018-08-29 ENCOUNTER — Encounter: Payer: Self-pay | Admitting: Adult Health Nurse Practitioner

## 2018-08-29 ENCOUNTER — Ambulatory Visit (INDEPENDENT_AMBULATORY_CARE_PROVIDER_SITE_OTHER): Payer: BC Managed Care – PPO | Admitting: Adult Health Nurse Practitioner

## 2018-08-29 VITALS — BP 110/72 | HR 79 | Temp 97.5°F | Wt 320.0 lb

## 2018-08-29 DIAGNOSIS — Z6841 Body Mass Index (BMI) 40.0 and over, adult: Secondary | ICD-10-CM | POA: Diagnosis not present

## 2018-08-29 DIAGNOSIS — R5383 Other fatigue: Secondary | ICD-10-CM

## 2018-08-29 DIAGNOSIS — I10 Essential (primary) hypertension: Secondary | ICD-10-CM | POA: Diagnosis not present

## 2018-08-29 DIAGNOSIS — E559 Vitamin D deficiency, unspecified: Secondary | ICD-10-CM | POA: Diagnosis not present

## 2018-08-29 DIAGNOSIS — D509 Iron deficiency anemia, unspecified: Secondary | ICD-10-CM

## 2018-08-29 NOTE — Progress Notes (Signed)
Assessment and Plan:  Mary Banks was seen today for fatigue.  Diagnoses and all orders for this visit:  Vitamin D deficiency Reports not consistant with supplement Encourage consistency, will check level.  -     VITAMIN D 25 Hydroxy (Vit-D Deficiency)  Iron deficiency anemia, unspecified iron deficiency anemia type Encouraged restarting supplement related to history Will check Iron panel  Fatigue, unspecified type -     CBC with Differential/Platelet -     COMPLETE METABOLIC PANEL WITH GFR -     Vitamin B12 -     TSH -     Iron,Total/Total Iron Binding Cap  BMI 50.0-59.9, adult (HCC) Discussed dietary and exercise modifications Encouraged positive behaviors   Hypertension, unspecified type Doing well on current regiment Taking Novasc 2.5mg  and Cozaar 25mg  and HCTZ 25mg . Continue to monitor Hypertension Continue medication: Monitor blood pressure at home; call if consistently over 130/80 Continue DASH diet.   Reminder to go to the ER if any CP, SOB, nausea, dizziness, severe HA, changes vision/speech, left arm numbness and tingling and jaw pain.  Discussed hospital precautions with patient and agrees with plan of care.  May contact office via phone [phone] or [place].   Follow up prn for with new or worsening symptoms   Further disposition pending results of labs. Discussed med's effects and SE's.   Over 30 minutes of exam, counseling, chart review, and critical decision making was performed.   Future Appointments  Date Time Provider [place]  12/14/2018  3:45 PM Liane Comber, NP [facility] None    ------------------------------------------------------------------------------------------------------------------   HPI 48 y.o.female presents for fatigue that started two weeks ago.  She reports she is active and has changed her diet and has improved her eating habits.  She has increase her fruits, vegetables and whole grains and avoiding carbohydrates  and fats.  She goes to the gym 4 days a week and rides the  Stationary bike.SHe also participates in body pump weight class.  She reports she is so tired and now just wants to sleep.  She said it is hard for her to stay wake while she is working.  She is unable to associate this post meal but from the time she wakes up in the morning.  She denies any snoring and reports she is sleeping through the night.  This started after her colonoscpy.  In addition to this she had vaginal bleeding for 7 days. This has since stopped. She is pre-menopausal.    In July she reports some heavy bleeding and she was treated by iron infusion for anemia.  She was bleeding for 7 weeks during this episode and was managed by Vilinda Boehringer and has history of fibroids. Report she was taking an iron supplement but has not been consistent with it lately.  She was taking fusion plus to help avoid constipation.   Past Medical History:  Diagnosis Date  . Anemia   . Anxiety and depression   . Herpes   . Hypertension      Allergies  Allergen Reactions  . Morphine Other (See Comments)    Other Reaction: hallucinations Hallucinations     Current Outpatient Medications on File Prior to Visit  Medication Sig  . ALPRAZolam (XANAX) 0.5 MG tablet Take 0.5 mg by mouth 2 (two) times daily as needed for anxiety.   Marland Kitchen amLODipine (NORVASC) 2.5 MG tablet Take 2.5 mg by mouth daily.  . Cholecalciferol 25 MCG (1000 UT) capsule Take 1,000 Units by mouth daily.   Marland Kitchen  diclofenac (FLECTOR) 1.3 % PTCH Place 1 patch onto the skin daily as needed (shoulder pain).  . Digestive Enzymes (DIGESTIVE ENZYME PO) Take 1 capsule by mouth daily.   . hydrochlorothiazide (HYDRODIURIL) 25 MG tablet Take 25 mg by mouth daily.   Marland Kitchen losartan (COZAAR) 25 MG tablet Take 25 mg by mouth daily.  . Multiple Vitamins-Minerals (MULTIVITAMIN ADULT PO) Take by mouth.  . Omega-3 Fatty Acids (FISH OIL) 1000 MG CAPS Take 1 capsule by mouth daily.  Marland Kitchen OVER THE COUNTER MEDICATION  Take 1 tablet by mouth daily. Adaptocrine XL daily   . phentermine 37.5 MG capsule Take 37.5 mg by mouth every morning.  . Turmeric 500 MG TABS Take 1 capsule by mouth daily.   . vitamin B-12 (CYANOCOBALAMIN) 1000 MCG tablet Take 1,000 mcg by mouth daily.  . fluconazole (DIFLUCAN) 150 MG tablet Take 1 tab once for yeast; repeat in 1 week if needed. (Patient not taking: Reported on 08/29/2018)   No current facility-administered medications on file prior to visit.     ROS: Review of Systems  Constitutional: Positive for malaise/fatigue. Negative for chills, diaphoresis, fever and weight loss.  HENT: Negative for congestion, ear discharge, ear pain, hearing loss, nosebleeds, sinus pain, sore throat and tinnitus.   Eyes: Negative for blurred vision, double vision, photophobia, pain, discharge and redness.  Respiratory: Negative for cough, hemoptysis, sputum production, shortness of breath, wheezing and stridor.   Cardiovascular: Negative for chest pain, palpitations, orthopnea, claudication, leg swelling and PND.  Gastrointestinal: Negative for abdominal pain, blood in stool, constipation, diarrhea, heartburn, melena, nausea and vomiting.  Genitourinary: Negative for dysuria, flank pain, frequency, hematuria and urgency.  Musculoskeletal: Negative for back pain, falls, joint pain, myalgias and neck pain.  Skin: Negative for itching and rash.  Neurological: Negative for dizziness, tingling, tremors, sensory change, speech change, focal weakness, seizures, loss of consciousness, weakness and headaches.  Endo/Heme/Allergies: Negative for environmental allergies and polydipsia. Does not bruise/bleed easily.  Psychiatric/Behavioral: Negative for depression, hallucinations, substance abuse and suicidal ideas. The patient is not nervous/anxious and does not have insomnia.      Physical Exam:  BP 110/72   Pulse 79   Temp (!) 97.5 F (36.4 C)   Wt (!) 320 lb (145.2 kg)   SpO2 98%   BMI 53.25 kg/m    General Appearance: Well nourished, in no apparent distress. Eyes: PERRLA, EOMs, conjunctiva no swelling or erythema Sinuses: No Frontal/maxillary tenderness ENT/Mouth: Ext aud canals clear, TMs without erythema, bulging. No erythema, swelling, or exudate on post pharynx.  Tonsils not swollen or erythematous. Hearing normal.  Neck: Supple, thyroid normal.  Respiratory: Respiratory effort normal, BS equal bilaterally without rales, rhonchi, wheezing or stridor.  Cardio: RRR with no MRGs. Brisk peripheral pulses without edema.  Abdomen: Soft, + BS.  Non tender, no guarding, rebound, hernias, masses. Lymphatics: Non tender without lymphadenopathy.  Musculoskeletal: Full ROM, 5/5 strength, normal gait.  Skin: Warm, dry without rashes, lesions, ecchymosis.  Neuro: Cranial nerves intact. Normal muscle tone, no cerebellar symptoms. Sensation intact.  Psych: Awake and oriented X 3, normal affect, Insight and Judgment appropriate.     Garnet Sierras, NP 3:53 PM [facility]

## 2018-08-29 NOTE — Patient Instructions (Signed)
We are checking your labs today and will call you with results in one to two days.

## 2018-08-30 ENCOUNTER — Other Ambulatory Visit: Payer: Self-pay | Admitting: Adult Health Nurse Practitioner

## 2018-08-30 ENCOUNTER — Other Ambulatory Visit: Payer: Self-pay

## 2018-08-30 DIAGNOSIS — E559 Vitamin D deficiency, unspecified: Secondary | ICD-10-CM

## 2018-08-30 LAB — COMPLETE METABOLIC PANEL WITH GFR
AG Ratio: 1.4 (calc) (ref 1.0–2.5)
ALT: 12 U/L (ref 6–29)
AST: 18 U/L (ref 10–35)
Albumin: 4.1 g/dL (ref 3.6–5.1)
Alkaline phosphatase (APISO): 64 U/L (ref 31–125)
BUN: 9 mg/dL (ref 7–25)
CO2: 28 mmol/L (ref 20–32)
Calcium: 9.6 mg/dL (ref 8.6–10.2)
Chloride: 103 mmol/L (ref 98–110)
Creat: 0.74 mg/dL (ref 0.50–1.10)
GFR, Est African American: 111 mL/min/{1.73_m2} (ref >=60)
GFR, Est Non African American: 96 mL/min/{1.73_m2} (ref >=60)
Globulin: 2.9 g/dL (calc) (ref 1.9–3.7)
Glucose, Bld: 91 mg/dL (ref 65–99)
Potassium: 4.3 mmol/L (ref 3.5–5.3)
Sodium: 139 mmol/L (ref 135–146)
Total Bilirubin: 0.5 mg/dL (ref 0.2–1.2)
Total Protein: 7 g/dL (ref 6.1–8.1)

## 2018-08-30 LAB — CBC WITH DIFFERENTIAL/PLATELET
Absolute Monocytes: 530 cells/uL (ref 200–950)
Basophils Absolute: 32 cells/uL (ref 0–200)
Basophils Relative: 0.6 %
Eosinophils Absolute: 58 cells/uL (ref 15–500)
Eosinophils Relative: 1.1 %
HCT: 40.4 % (ref 35.0–45.0)
Hemoglobin: 13.7 g/dL (ref 11.7–15.5)
Lymphs Abs: 2343 cells/uL (ref 850–3900)
MCH: 31.3 pg (ref 27.0–33.0)
MCHC: 33.9 g/dL (ref 32.0–36.0)
MCV: 92.2 fL (ref 80.0–100.0)
MPV: 10.6 fL (ref 7.5–12.5)
Monocytes Relative: 10 %
Neutro Abs: 2337 cells/uL (ref 1500–7800)
Neutrophils Relative %: 44.1 %
Platelets: 311 10*3/uL (ref 140–400)
RBC: 4.38 10*6/uL (ref 3.80–5.10)
RDW: 13.1 % (ref 11.0–15.0)
Total Lymphocyte: 44.2 %
WBC: 5.3 10*3/uL (ref 3.8–10.8)

## 2018-08-30 LAB — IRON, TOTAL/TOTAL IRON BINDING CAP
%SAT: 16 % (calc) (ref 16–45)
Iron: 60 ug/dL (ref 40–190)
TIBC: 368 mcg/dL (calc) (ref 250–450)

## 2018-08-30 LAB — VITAMIN D 25 HYDROXY (VIT D DEFICIENCY, FRACTURES): Vit D, 25-Hydroxy: 46 ng/mL (ref 30–100)

## 2018-08-30 LAB — VITAMIN B12: Vitamin B-12: 923 pg/mL (ref 200–1100)

## 2018-08-30 LAB — TSH: TSH: 1.99 mIU/L

## 2018-08-30 MED ORDER — FUSION PLUS PO CAPS: ORAL_CAPSULE | ORAL | 3 refills | Status: DC

## 2018-08-30 MED ORDER — CHOLECALCIFEROL 1.25 MG (50000 UT) PO CAPS: ORAL_CAPSULE | ORAL | 0 refills | Status: DC

## 2018-09-01 ENCOUNTER — Encounter: Payer: Self-pay | Admitting: Adult Health Nurse Practitioner

## 2018-11-14 ENCOUNTER — Other Ambulatory Visit: Payer: Self-pay | Admitting: Adult Health Nurse Practitioner

## 2018-11-14 DIAGNOSIS — E559 Vitamin D deficiency, unspecified: Secondary | ICD-10-CM

## 2018-12-13 DIAGNOSIS — F419 Anxiety disorder, unspecified: Secondary | ICD-10-CM | POA: Insufficient documentation

## 2018-12-13 DIAGNOSIS — R5383 Other fatigue: Secondary | ICD-10-CM

## 2018-12-13 HISTORY — DX: Other fatigue: R53.83

## 2018-12-13 NOTE — Progress Notes (Signed)
FOLLOW UP  Assessment and Plan:   Hypertension Well controlled with current medications  Monitor blood pressure at home; patient to call if consistently greater than 130/80 Continue DASH diet.   Reminder to go to the ER if any CP, SOB, nausea, dizziness, severe HA, changes vision/speech, left arm numbness and tingling and jaw pain.  Obesity with co morbidities Long discussion counseling about weight loss, diet, and exercise Recommended diet heavy in fruits and veggies and low in animal meats, cheeses, and dairy products, appropriate calorie intake Discussed ideal weight for height  Patient will work on portion sizes, measuring nuts and granola, cut back on potatoes, stress and sleep management, restarting phentermine Patient on phentermine with benefit and no SE, taking drug breaks; continue close follow up. Return in 1 month  Vitamin D Def Below goal at last visit; newly on 50000 IU weekly with significant perceived benefit continue supplementation to maintain goal of 60-100 Check Vit D level (she will get drawn at [facility])  Fatigue  Resolved with vitamin D supplement addition Continue lifestyle modifcation  Continue diet and meds as discussed. Further disposition pending results of labs. Discussed med's effects and SE's.   Over 30 minutes of exam, counseling, chart review, and critical decision making was performed.   No future appointments.  ----------------------------------------------------------------------------------------------------------------------  HPI 48 y.o. female  presents for 3 month follow up on hypertension, fatigue, vitamin D deficiency, obesity   she is prescribed phentermine for weight loss, but admits not taking at this, hasn't taken consistently since March. Started via weight loss clinic last year, did note significant improvement initially, lost 15 lb in 6 months, but is back to baseline weight. While on the medication they have lost 0 lbs since last  visit. They deny palpitations, anxiety, trouble sleeping, elevated BP. She has successfully gotten down to 250 lb in 2013 with exercise, phentermine.   BMI is Body mass index is 54.42 kg/m., she is working on exercise, was doing videos while social distancing, now back to gym, does 45-60 min, 3-5 days a week, increased energy.  She reports she has noted improvement in clothing fitment, down 1-2 jean sizes  Wt Readings from Last 3 Encounters:  12/14/18 (!) 327 lb (148.3 kg)  08/29/18 (!) 320 lb (145.2 kg)  08/15/18 290 lb (131.5 kg)   Typical breakfast: Protein shake or oatmeal or granola with pea protein milk, with raisins, 1 cup of coffee (powdered creamer, 1 tablespoon) AM snack: occasionally a kind bar Typical lunch: Protein shake with banana, or will do tuna salad with crackers, salad (veggies, croutons, with fish and ranch or balsamic) PM snack: organics fresh juice 12-16 fluid ounces, nuts  Typical dinner: Cooking at home, shrimp or fish, whole wheat pasta, veggies, salad  May have 6 glasses of wine weekly   Water: water, carries    Exercise: GYM 45-60 min, 3-5 days per week Water intake: 64 fluid ounces x 2-3    Stress: moderate, managing well with support and lifestyle   Sleep: 5-6 hours currently  Her blood pressure has been controlled at home, today their BP is BP: 118/78  She does workout. She denies chest pain, shortness of breath, dizziness.  Patient is on Vitamin D supplement, reports significant improvement in fatigue, myalgias, can note significant difference when misses, she is taking 50000 IU once weekly.   Lab Results  Component Value Date   VD25OH 46 08/29/2018       Current Medications:  Current Outpatient Medications on File Prior to  Visit  Medication Sig  . ALPRAZolam (XANAX) 0.5 MG tablet Take 0.5 mg by mouth 2 (two) times daily as needed for anxiety.   Marland Kitchen amLODipine (NORVASC) 2.5 MG tablet Take 2.5 mg by mouth daily.  . Cholecalciferol (D3-50) 1.25 MG  (50000 UT) capsule TAKE ONE TABLET BY MOUTH ONCE A WEEK FOR 12 WEEKS.  . Digestive Enzymes (DIGESTIVE ENZYME PO) Take 1 capsule by mouth daily.   Marland Kitchen losartan-hydrochlorothiazide (HYZAAR) 100-25 MG tablet Take 1 tablet by mouth daily.  . Multiple Vitamins-Minerals (MULTIVITAMIN ADULT PO) Take by mouth.  . Omega-3 Fatty Acids (FISH OIL) 1000 MG CAPS Take 1 capsule by mouth daily.  . phentermine 37.5 MG capsule Take 37.5 mg by mouth every morning.  . Turmeric 500 MG TABS Take 1 capsule by mouth daily.   . vitamin B-12 (CYANOCOBALAMIN) 1000 MCG tablet Take 1,000 mcg by mouth daily.  . diclofenac (FLECTOR) 1.3 % PTCH Place 1 patch onto the skin daily as needed (shoulder pain).  . fluconazole (DIFLUCAN) 150 MG tablet Take 1 tab once for yeast; repeat in 1 week if needed. (Patient not taking: Reported on 08/29/2018)  . hydrochlorothiazide (HYDRODIURIL) 25 MG tablet Take 25 mg by mouth daily.   . Iron-FA-B Cmp-C-Biot-Probiotic (FUSION PLUS) CAPS Take one capsule daily  . losartan (COZAAR) 25 MG tablet Take 25 mg by mouth daily.  Marland Kitchen OVER THE COUNTER MEDICATION Take 1 tablet by mouth daily. Adaptocrine XL daily    No current facility-administered medications on file prior to visit.      Allergies:  Allergies  Allergen Reactions  . Morphine Other (See Comments)    Other Reaction: hallucinations Hallucinations      Medical History:  Past Medical History:  Diagnosis Date  . Anemia   . Anxiety and depression   . Herpes   . Hypertension    Family history- Reviewed and unchanged Social history- Reviewed and unchanged   Review of Systems:  Review of Systems  Constitutional: Negative for malaise/fatigue and weight loss.  HENT: Negative for hearing loss and tinnitus.   Eyes: Negative for blurred vision and double vision.  Respiratory: Negative for cough, shortness of breath and wheezing.   Cardiovascular: Negative for chest pain, palpitations, orthopnea, claudication and leg swelling.   Gastrointestinal: Negative for abdominal pain, blood in stool, constipation, diarrhea, heartburn, melena, nausea and vomiting.  Genitourinary: Negative.   Musculoskeletal: Negative for joint pain and myalgias.  Skin: Negative for rash.  Neurological: Negative for dizziness, tingling, sensory change, weakness and headaches.  Endo/Heme/Allergies: Negative for polydipsia.  Psychiatric/Behavioral: Negative.   All other systems reviewed and are negative.    Physical Exam: BP 118/78   Pulse 74   Temp (!) 97.5 F (36.4 C)   Ht 5\' 5"  (1.651 m)   Wt (!) 327 lb (148.3 kg)   SpO2 97%   BMI 54.42 kg/m  Wt Readings from Last 3 Encounters:  12/14/18 (!) 327 lb (148.3 kg)  08/29/18 (!) 320 lb (145.2 kg)  08/15/18 290 lb (131.5 kg)   General Appearance: Well nourished, morbidly obese (central), in no apparent distress. Eyes: PERRLA, EOMs, conjunctiva no swelling or erythema Sinuses: No Frontal/maxillary tenderness ENT/Mouth: Ext aud canals clear, TMs without erythema, bulging. No erythema, swelling, or exudate on post pharynx.  Tonsils not swollen or erythematous. Hearing normal.  Neck: Supple, thyroid normal.  Respiratory: Respiratory effort normal, BS equal bilaterally without rales, rhonchi, wheezing or stridor.  Cardio: RRR with no MRGs. Brisk peripheral pulses without edema.  Abdomen:  Soft, + BS.  Non tender, no guarding, rebound, hernias, masses. Lymphatics: Non tender without lymphadenopathy.  Musculoskeletal: Full ROM, 5/5 strength, Normal gait Skin: Warm, dry without rashes, lesions, ecchymosis.  Neuro: Cranial nerves intact. No cerebellar symptoms.  Psych: Awake and oriented X 3, normal affect, Insight and Judgment appropriate.    [name], NP 4:23 PM [facility]

## 2018-12-14 ENCOUNTER — Ambulatory Visit (INDEPENDENT_AMBULATORY_CARE_PROVIDER_SITE_OTHER): Payer: BC Managed Care – PPO | Admitting: Adult Health

## 2018-12-14 ENCOUNTER — Other Ambulatory Visit: Payer: Self-pay

## 2018-12-14 ENCOUNTER — Encounter: Payer: Self-pay | Admitting: Adult Health

## 2018-12-14 VITALS — BP 118/78 | HR 74 | Temp 97.5°F | Ht 65.0 in | Wt 327.0 lb

## 2018-12-14 DIAGNOSIS — I10 Essential (primary) hypertension: Secondary | ICD-10-CM

## 2018-12-14 DIAGNOSIS — F419 Anxiety disorder, unspecified: Secondary | ICD-10-CM | POA: Diagnosis not present

## 2018-12-14 DIAGNOSIS — Z6841 Body Mass Index (BMI) 40.0 and over, adult: Secondary | ICD-10-CM

## 2018-12-14 DIAGNOSIS — E559 Vitamin D deficiency, unspecified: Secondary | ICD-10-CM

## 2018-12-14 DIAGNOSIS — E782 Mixed hyperlipidemia: Secondary | ICD-10-CM

## 2018-12-14 DIAGNOSIS — R5383 Other fatigue: Secondary | ICD-10-CM

## 2018-12-14 DIAGNOSIS — E785 Hyperlipidemia, unspecified: Secondary | ICD-10-CM | POA: Insufficient documentation

## 2018-12-14 MED ORDER — TRIAMCINOLONE ACETONIDE 0.5 % EX OINT: 1.0000 "application " | TOPICAL_OINTMENT | Freq: Two times a day (BID) | CUTANEOUS | 0 refills | Status: DC

## 2018-12-14 MED ORDER — PHENTERMINE HCL 37.5 MG PO CAPS: 37.5000 mg | ORAL_CAPSULE | ORAL | 2 refills | Status: DC

## 2018-12-14 NOTE — Patient Instructions (Addendum)
Please ask your other provider to check a vitamin D level - goal is 60-100    Measure granola and nuts in particular  Cut down on potatoes   Focus on lean proteins, fibrous veggies, starches and healthy fats in moderationg  Aim to get minimum 7 hours, may need up to 8-9 hours  Prioritize stress management       8 Critical Weight-Loss Tips That Aren't Diet and Exercise  1. STARVE THE DISTRACTIONS  All too often when we eat, we're also multitasking: watching TV, answering emails, scrolling through social media. These habits are detrimental to having a strong, clear, healthy relationship with food, and they can hinder our ability to make dietary changes.  In order to truly focus on what you're eating, how much you're eating, why you're eating those specific foods and, most importantly, how those foods make you feel, you need to starve the distractions. That means when you eat, just eat. Focus on your food, the process it went through to end up on your plate, where it came from and how it nourishes you. With this technique, you're more likely to finish a meal feeling satiated.  2.  CONSIDER WHAT YOU'RE NOT WILLING TO DO  This might sound counterintuitive, but it can help provide a "why" when motivation is waning. Declare, in writing, what you are unwilling to do, for example "I am unwilling to be the old dad who cannot play sports with my children".  So consider what you're not willing to accept, write it down, and keep it at the ready.  3.  STOP LABELING FOOD "GOOD" AND "BAD"  You've probably heard someone say they ate something "bad." Maybe you've even said it yourself.  The trouble with 'bad' foods isn't that they'll send you to the grave after a bite or two. The trouble comes when we eat excessive portions of really calorie-dense foods meal after meal, day after day.  Instead of labeling foods as good or bad, think about which foods you can eat a lot of, and which ones you  should just eat a little of. Then, plan ways to eat the foods you really like in portions that fit with your overall goals. A good example of this would be having a slice of pizza alongside a club salad with chicken breast, avocado and a bit of dressing. This is vastly different than 3 slices of pizza, 4 breadsticks with cheese sauce and half of a liter of regular soda.  4.  BRUSH YOUR TEETH AFTER YOU EAT  Getting your mindset in order is important, but sometimes small habits can make a big difference. After eating, you still have the taste of food in their mouth, which often causes people to eat more even if they are full or engage in a nibble or two of dessert.  Brushing your teeth will remove the taste of food from your mouth, and the clean, minty freshness will serve as a cue that mealtime is over.  5.  FOCUS ON CROWDING NOT CUTTING  The most common first step during 'dieting' is to cut. We cut our portion sizes down, we cut out 'bad' foods, we cut out entire food groups. This act of cutting puts [place] and our minds into scarcity mode.  When something is off-limits, even if you're able to avoid it for a while, you could end up bingeing on it later because you've gone so long without it. So, instead of cutting, focus on crowding. If you crowd  your plate and fill it up with more foods like veggies and protein, it simply allows less room for the other stuff. In other words, shift your focus away from what you can't eat, and celebrate the foods that will help you reach your goals.  6.  TAKE TRACKING A STEP FURTHER  Track what you eat, when you ate it, how much you ate and how that food made you feel. Being completely honest with yourself and writing down every single thing that passes through your lips will help you start to notice that maybe you actually do snack, possibly take in more sugar than you thought, eat when you're bored rather than just hungry or maybe that you have a habit of snacking before  bed while watching TV.  The difference from simply tracking your food intake is you're taking into account how food makes you feel, as well as what you're doing while you're eating. This is about becoming more mindful of what, when and why you eat.  7.  PRIORITIZE GOOD SLEEP  One of the strongest risk factors for being overweight is poor sleep. When you're feeling tired, you're more likely to choose unhealthy comfort foods and to skip your workout. Additionally, sleep deprivation may slow down your metabolism. [employer]! Therefore, sleeping 7-8 hours per night can help with weight loss without having to change your diet or increase your physical activity. And if you feel you snore and still wake up tired, talk with me about sleep apnea.  8.  SET ASIDE TIME TO DISCONNECT  Just get out there. Disconnect from the electronics and connect to the elements. Not only will this help reduce stress (a major factor in weight gain) by giving your mind a break from the constant stimulation we've all become so accustomed to, but it may also reprogram your brain to connect with yourself and what you're feeling.

## 2018-12-16 ENCOUNTER — Other Ambulatory Visit: Payer: Self-pay | Admitting: Family Medicine

## 2018-12-16 DIAGNOSIS — Z1231 Encounter for screening mammogram for malignant neoplasm of breast: Secondary | ICD-10-CM

## 2019-01-13 NOTE — Progress Notes (Deleted)
FOLLOW UP  Assessment and Plan:   Hypertension Well controlled with current medications  Monitor blood pressure at home; patient to call if consistently greater than 130/80 Continue DASH diet.   Reminder to go to the ER if any CP, SOB, nausea, dizziness, severe HA, changes vision/speech, left arm numbness and tingling and jaw pain.  Obesity with co morbidities Long discussion counseling about weight loss, diet, and exercise Recommended diet heavy in fruits and veggies and low in animal meats, cheeses, and dairy products, appropriate calorie intake Discussed ideal weight for height  Patient will work on portion sizes, measuring nuts and granola, cut back on potatoes, stress and sleep management, restarting phentermine Patient on phentermine with benefit and no SE, taking drug breaks; continue close follow up. Return in 1 month  Vitamin D Def Below goal at last visit; newly on 50000 IU weekly with significant perceived benefit continue supplementation to maintain goal of 60-100 Check Vit D level (she will get drawn at [facility])  Fatigue  Resolved with vitamin D supplement addition Continue lifestyle modifcation  Continue diet and meds as discussed. Further disposition pending results of labs. Discussed med's effects and SE's.   Over 30 minutes of exam, counseling, chart review, and critical decision making was performed.   Future Appointments  Date Time Provider [place]  01/16/2019  4:00 PM Liane Comber, NP [facility] None  02/07/2019  1:50 PM [facility]    ----------------------------------------------------------------------------------------------------------------------  HPI 48 y.o. female  presents for 1 month follow up on hypertension, morbid obesity, anxiety.    she is prescribed phentermine for weight loss, newly restarted after last visit, Started via weight loss clinic last year, did note significant improvement initially, lost 15 lb  in 6 months, but hadn't followed up with them and after discussion has requested we follow up monthly here for continued monitoring. She has successfully gotten down to 250 lb in 2013 with exercise, phentermine.   While on the medication they have lost *** lbs since last visit. They deny palpitations, anxiety, trouble sleeping, elevated BP.   *** Patient will work on portion sizes, measuring nuts and granola, cut back on potatoes, stress and sleep management, restarting phentermine   BMI is There is no height or weight on file to calculate BMI., she is working on exercise, was doing videos while social distancing, now back to gym, does 45-60 min, 3-5 days a week, increased energy.  She reports she has noted improvement in clothing fitment, down 1-2 jean sizes  Wt Readings from Last 3 Encounters:  12/14/18 (!) 327 lb (148.3 kg)  08/29/18 (!) 320 lb (145.2 kg)  08/15/18 290 lb (131.5 kg)   Typical breakfast: Protein shake or oatmeal or granola with pea protein milk, with raisins, 1 cup of coffee (powdered creamer, 1 tablespoon) AM snack: occasionally a kind bar Typical lunch: Protein shake with banana, or will do tuna salad with crackers, salad (veggies, croutons, with fish and ranch or balsamic) PM snack: organics fresh juice 12-16 fluid ounces, nuts  Typical dinner: Cooking at home, shrimp or fish, whole wheat pasta, veggies, salad  May have 6 glasses of wine weekly   Water: water, carries    Exercise: GYM 45-60 min, 3-5 days per week Water intake: 64 fluid ounces x 2-3    Stress: moderate, managing well with support and lifestyle   Sleep: 5-6 hours currently  Her blood pressure has been controlled at home, today their BP is    She is  taking losartan/hctz 100/25 mg daily, amlodipine 2.5 mg daily.   She does workout. She denies chest pain, shortness of breath, dizziness.  she has a diagnosis of anxiety and is currently on PRN xanax (0.5 mg BID PRN), reports symptoms are*** well  controlled on current regimen. she ***, last filled on 09/19/2018 per PDMP.    *** did she get vit D drawn by [facility]?    She {Has/has not:18111} been working on diet and exercise for prediabetes, and denies {Symptoms; diabetes w/o none:19199}. Last A1C in the office was: No results found for: HGBA1C Patient is on Vitamin D supplement.   Lab Results  Component Value Date   VD25OH 46 08/29/2018       Current Medications:  Current Outpatient Medications on File Prior to Visit  Medication Sig  . ALPRAZolam (XANAX) 0.5 MG tablet Take 0.5 mg by mouth 2 (two) times daily as needed for anxiety.   Marland Kitchen amLODipine (NORVASC) 2.5 MG tablet Take 2.5 mg by mouth daily.  . Cholecalciferol (D3-50) 1.25 MG (50000 UT) capsule TAKE ONE TABLET BY MOUTH ONCE A WEEK FOR 12 WEEKS.  . diclofenac (FLECTOR) 1.3 % PTCH Place 1 patch onto the skin daily as needed (shoulder pain).  . Digestive Enzymes (DIGESTIVE ENZYME PO) Take 1 capsule by mouth daily.   Marland Kitchen losartan-hydrochlorothiazide (HYZAAR) 100-25 MG tablet Take 1 tablet by mouth daily.  . Multiple Vitamins-Minerals (MULTIVITAMIN ADULT PO) Take by mouth.  . Omega-3 Fatty Acids (FISH OIL) 1000 MG CAPS Take 1 capsule by mouth daily.  . phentermine 37.5 MG capsule Take 1 capsule (37.5 mg total) by mouth every morning.  . triamcinolone ointment (KENALOG) 0.5 % Apply 1 application topically 2 (two) times daily.  . Turmeric 500 MG TABS Take 1 capsule by mouth daily.   . vitamin B-12 (CYANOCOBALAMIN) 1000 MCG tablet Take 1,000 mcg by mouth daily.   No current facility-administered medications on file prior to visit.      Allergies:  Allergies  Allergen Reactions  . Morphine Other (See Comments)    Other Reaction: hallucinations Hallucinations      Medical History:  Past Medical History:  Diagnosis Date  . Anemia   . Anxiety and depression   . Herpes   . Hypertension    Family history- Reviewed and unchanged Social history- Reviewed and  unchanged   Review of Systems:  Review of Systems  Constitutional: Negative for malaise/fatigue and weight loss.  HENT: Negative for hearing loss and tinnitus.   Eyes: Negative for blurred vision and double vision.  Respiratory: Negative for cough, shortness of breath and wheezing.   Cardiovascular: Negative for chest pain, palpitations, orthopnea, claudication and leg swelling.  Gastrointestinal: Negative for abdominal pain, blood in stool, constipation, diarrhea, heartburn, melena, nausea and vomiting.  Genitourinary: Negative.   Musculoskeletal: Negative for joint pain and myalgias.  Skin: Negative for rash.  Neurological: Negative for dizziness, tingling, sensory change, weakness and headaches.  Endo/Heme/Allergies: Negative for polydipsia.  Psychiatric/Behavioral: Negative.   All other systems reviewed and are negative.    Physical Exam: There were no vitals taken for this visit. Wt Readings from Last 3 Encounters:  12/14/18 (!) 327 lb (148.3 kg)  08/29/18 (!) 320 lb (145.2 kg)  08/15/18 290 lb (131.5 kg)   General Appearance: Well nourished, morbidly obese (central), in no apparent distress. Eyes: PERRLA, EOMs, conjunctiva no swelling or erythema Sinuses: No Frontal/maxillary tenderness ENT/Mouth: Ext aud canals clear, TMs without erythema, bulging. No erythema, swelling, or exudate on  post pharynx.  Tonsils not swollen or erythematous. Hearing normal.  Neck: Supple, thyroid normal.  Respiratory: Respiratory effort normal, BS equal bilaterally without rales, rhonchi, wheezing or stridor.  Cardio: RRR with no MRGs. Brisk peripheral pulses without edema.  Abdomen: Soft, + BS.  Non tender, no guarding, rebound, hernias, masses. Lymphatics: Non tender without lymphadenopathy.  Musculoskeletal: Full ROM, 5/5 strength, Normal gait Skin: Warm, dry without rashes, lesions, ecchymosis.  Neuro: Cranial nerves intact. No cerebellar symptoms.  Psych: Awake and oriented X 3, normal  affect, Insight and Judgment appropriate.    [name], NP 12:14 PM [facility]

## 2019-01-16 ENCOUNTER — Ambulatory Visit: Payer: BC Managed Care – PPO | Admitting: Adult Health

## 2019-01-20 NOTE — Progress Notes (Deleted)
FOLLOW UP  Assessment and Plan:   Hypertension Well controlled with current medications  Monitor blood pressure at home; patient to call if consistently greater than 130/80 Continue DASH diet.   Reminder to go to the ER if any CP, SOB, nausea, dizziness, severe HA, changes vision/speech, left arm numbness and tingling and jaw pain.  Obesity with co morbidities Long discussion counseling about weight loss, diet, and exercise Recommended diet heavy in fruits and veggies and low in animal meats, cheeses, and dairy products, appropriate calorie intake Discussed ideal weight for height  Patient will work on portion sizes, measuring nuts and granola, cut back on potatoes, stress and sleep management, restarting phentermine Patient on phentermine with benefit and no SE, taking drug breaks; continue close follow up. Return in 1 month  Vitamin D Def Below goal at last visit; newly on 50000 IU weekly with significant perceived benefit continue supplementation to maintain goal of 60-100 Check Vit D level (she will get drawn at [facility])  Fatigue  Resolved with vitamin D supplement addition Continue lifestyle modifcation  Continue diet and meds as discussed. Further disposition pending results of labs. Discussed med's effects and SE's.   Over 30 minutes of exam, counseling, chart review, and critical decision making was performed.   Future Appointments  Date Time Provider [place]  01/23/2019  4:00 PM Liane Comber, NP [facility] None  02/07/2019  1:50 PM [facility]    ----------------------------------------------------------------------------------------------------------------------  HPI 48 y.o. female  presents for 1 month follow up on hypertension, morbid obesity, anxiety.    she is prescribed phentermine for weight loss, newly restarted after last visit, Started via weight loss clinic last year, did note significant improvement initially, lost 15 lb  in 6 months, but hadn't followed up with them and after discussion has requested we follow up monthly here for continued monitoring. She has successfully gotten down to 250 lb in 2013 with exercise, phentermine.   While on the medication they have lost *** lbs since last visit. They deny palpitations, anxiety, trouble sleeping, elevated BP.   *** Patient will work on portion sizes, measuring nuts and granola, cut back on potatoes, stress and sleep management, restarting phentermine   BMI is There is no height or weight on file to calculate BMI., she is working on exercise, was doing videos while social distancing, now back to gym, does 45-60 min, 3-5 days a week, increased energy.  She reports she has noted improvement in clothing fitment, down 1-2 jean sizes  Wt Readings from Last 3 Encounters:  12/14/18 (!) 327 lb (148.3 kg)  08/29/18 (!) 320 lb (145.2 kg)  08/15/18 290 lb (131.5 kg)   Typical breakfast: Protein shake or oatmeal or granola with pea protein milk, with raisins, 1 cup of coffee (powdered creamer, 1 tablespoon) AM snack: occasionally a kind bar Typical lunch: Protein shake with banana, or will do tuna salad with crackers, salad (veggies, croutons, with fish and ranch or balsamic) PM snack: organics fresh juice 12-16 fluid ounces, nuts  Typical dinner: Cooking at home, shrimp or fish, whole wheat pasta, veggies, salad  May have 6 glasses of wine weekly   Water: water, carries    Exercise: GYM 45-60 min, 3-5 days per week Water intake: 64 fluid ounces x 2-3    Stress: moderate, managing well with support and lifestyle   Sleep: 5-6 hours currently  Her blood pressure has been controlled at home, today their BP is    She is  taking losartan/hctz 100/25 mg daily, amlodipine 2.5 mg daily.   She does workout. She denies chest pain, shortness of breath, dizziness.  she has a diagnosis of anxiety and is currently on PRN xanax (0.5 mg BID PRN), reports symptoms are*** well  controlled on current regimen. she ***, last filled on 09/19/2018 per PDMP.    *** did she get vit D drawn by [facility]?    She {Has/has not:18111} been working on diet and exercise for prediabetes, and denies {Symptoms; diabetes w/o none:19199}. Last A1C in the office was: No results found for: HGBA1C Patient is on Vitamin D supplement.   Lab Results  Component Value Date   VD25OH 46 08/29/2018       Current Medications:  Current Outpatient Medications on File Prior to Visit  Medication Sig  . ALPRAZolam (XANAX) 0.5 MG tablet Take 0.5 mg by mouth 2 (two) times daily as needed for anxiety.   Marland Kitchen amLODipine (NORVASC) 2.5 MG tablet Take 2.5 mg by mouth daily.  . Cholecalciferol (D3-50) 1.25 MG (50000 UT) capsule TAKE ONE TABLET BY MOUTH ONCE A WEEK FOR 12 WEEKS.  . diclofenac (FLECTOR) 1.3 % PTCH Place 1 patch onto the skin daily as needed (shoulder pain).  . Digestive Enzymes (DIGESTIVE ENZYME PO) Take 1 capsule by mouth daily.   Marland Kitchen losartan-hydrochlorothiazide (HYZAAR) 100-25 MG tablet Take 1 tablet by mouth daily.  . Multiple Vitamins-Minerals (MULTIVITAMIN ADULT PO) Take by mouth.  . Omega-3 Fatty Acids (FISH OIL) 1000 MG CAPS Take 1 capsule by mouth daily.  . phentermine 37.5 MG capsule Take 1 capsule (37.5 mg total) by mouth every morning.  . triamcinolone ointment (KENALOG) 0.5 % Apply 1 application topically 2 (two) times daily.  . Turmeric 500 MG TABS Take 1 capsule by mouth daily.   . vitamin B-12 (CYANOCOBALAMIN) 1000 MCG tablet Take 1,000 mcg by mouth daily.   No current facility-administered medications on file prior to visit.      Allergies:  Allergies  Allergen Reactions  . Morphine Other (See Comments)    Other Reaction: hallucinations Hallucinations      Medical History:  Past Medical History:  Diagnosis Date  . Anemia   . Anxiety and depression   . Herpes   . Hypertension    Family history- Reviewed and unchanged Social history- Reviewed and  unchanged   Review of Systems:  Review of Systems  Constitutional: Negative for malaise/fatigue and weight loss.  HENT: Negative for hearing loss and tinnitus.   Eyes: Negative for blurred vision and double vision.  Respiratory: Negative for cough, shortness of breath and wheezing.   Cardiovascular: Negative for chest pain, palpitations, orthopnea, claudication and leg swelling.  Gastrointestinal: Negative for abdominal pain, blood in stool, constipation, diarrhea, heartburn, melena, nausea and vomiting.  Genitourinary: Negative.   Musculoskeletal: Negative for joint pain and myalgias.  Skin: Negative for rash.  Neurological: Negative for dizziness, tingling, sensory change, weakness and headaches.  Endo/Heme/Allergies: Negative for polydipsia.  Psychiatric/Behavioral: Negative.   All other systems reviewed and are negative.    Physical Exam: There were no vitals taken for this visit. Wt Readings from Last 3 Encounters:  12/14/18 (!) 327 lb (148.3 kg)  08/29/18 (!) 320 lb (145.2 kg)  08/15/18 290 lb (131.5 kg)   General Appearance: Well nourished, morbidly obese (central), in no apparent distress. Eyes: PERRLA, EOMs, conjunctiva no swelling or erythema Sinuses: No Frontal/maxillary tenderness ENT/Mouth: Ext aud canals clear, TMs without erythema, bulging. No erythema, swelling, or exudate on  post pharynx.  Tonsils not swollen or erythematous. Hearing normal.  Neck: Supple, thyroid normal.  Respiratory: Respiratory effort normal, BS equal bilaterally without rales, rhonchi, wheezing or stridor.  Cardio: RRR with no MRGs. Brisk peripheral pulses without edema.  Abdomen: Soft, + BS.  Non tender, no guarding, rebound, hernias, masses. Lymphatics: Non tender without lymphadenopathy.  Musculoskeletal: Full ROM, 5/5 strength, Normal gait Skin: Warm, dry without rashes, lesions, ecchymosis.  Neuro: Cranial nerves intact. No cerebellar symptoms.  Psych: Awake and oriented X 3, normal  affect, Insight and Judgment appropriate.    [name], NP 8:48 AM [facility]

## 2019-01-23 ENCOUNTER — Ambulatory Visit: Payer: BC Managed Care – PPO | Admitting: Adult Health

## 2019-01-25 NOTE — Progress Notes (Signed)
Virtual Visit via Telephone Note  I connected with Mary Banks on 01/26/19 at  3:30 PM EDT by telephone and verified that I am speaking with the correct person using two identifiers.  Location: Patient: home  Provider: [place] office    I discussed the limitations, risks, security and privacy concerns of performing an evaluation and management service by telephone and the availability of in person appointments. I also discussed with the patient that there may be a patient responsible charge related to this service. The patient expressed understanding and agreed to proceed.    I discussed the assessment and treatment plan with the patient. The patient was provided an opportunity to ask questions and all were answered. The patient agreed with the plan and demonstrated an understanding of the instructions.   The patient was advised to call back or seek an in-person evaluation if the symptoms worsen or if the condition fails to improve as anticipated.  I provided 30 minutes of non-face-to-face time during this encounter.   [name], NP     FOLLOW UP  Assessment and Plan:   Hypertension Historically well controlled with current medications, no value to report today but was normal earlier in the week at [facility] Monitor blood pressure at home; patient to call if consistently greater than 130/80 Continue DASH diet.   Reminder to go to the ER if any CP, SOB, nausea, dizziness, severe HA, changes vision/speech, left arm numbness and tingling and jaw pain.  Obesity with co morbidities Long discussion counseling about weight loss, diet, and exercise Recommended diet heavy in fruits and veggies and low in animal meats, cheeses, and dairy products, appropriate calorie intake Discussed ideal weight for height  Patient will continue with portion control, stress management, prioritizing sleep, exercise Patient on phentermine with benefit and no SE, taking drug breaks; continue close follow  up. Return in 6 weeks  Vitamin D Def  newly on 50000 IU weekly with significant perceived benefit - 64 when checked at [facility] continue supplementation to maintain goal of 60-100  Fatigue  Resolved with vitamin D supplement addition Continue lifestyle modifcation  Continue diet and meds as discussed. Further disposition pending results of labs. Discussed med's effects and SE's.   Over 30 minutes of exam, counseling, chart review, and critical decision making was performed.   Future Appointments  Date Time Provider [place]  02/07/2019  1:50 PM [facility]    ----------------------------------------------------------------------------------------------------------------------  HPI Wt (!) 325 lb 9.6 oz (147.7 kg)   BMI 54.18 kg/m   48 y.o. female  presents for 1 month follow up on hypertension, morbid obesity, anxiety.   She reports 2 sudden deaths and work has been very stressful and hasn't done as well with weight as she'd like.    she is prescribed phentermine for weight loss, newly restarted after last visit, Started via weight loss clinic last year, did note significant improvement initially, lost 15 lb in 6 months, but hadn't followed up with them and after discussion has requested we follow up monthly here for continued monitoring. She has successfully gotten down to 250 lb in 2013 with exercise, phentermine.   While on the medication they have lost 2 lbs since last visit. They deny palpitations, anxiety, trouble sleeping, elevated BP.   BMI is Body mass index is 54.18 kg/m., she is working on exercise, was doing videos while social distancing, now back to gym, does 45-60 min, 3-5 days a week, increased energy.  She  reports she has noted improvement in clothing fitment, down 1-2 jean sizes  She has been working on portion sizes, measuring nuts and granola, cutting back on potatoes. Doing once a week shopping after meal planning, keeping bad things  out of the house.   Wt Readings from Last 3 Encounters:  01/26/19 (!) 325 lb 9.6 oz (147.7 kg)  12/14/18 (!) 327 lb (148.3 kg)  08/29/18 (!) 320 lb (145.2 kg)   Typical breakfast: Protein shake or granola or raisin bran with pea protein milk, with raisins, 1 cup of coffee (powdered creamer, 1 tablespoon) AM snack: watermelon  Typical lunch:  will do tuna salad with crackers, salad (veggies, croutons, with fish and ranch or balsamic), Doing red bean/lentil soup  PM snack: organics fresh juice 12-16 fluid ounces, nuts  Typical dinner: Cooking at home, shrimp or fish, veggies, salad or bean soup  Does splurge some on the weekend and orders in  Has significantly reduced wine intake, didn't drink at all for 2 weeks, having an occasional cocktail or whisky (2-3 drinks per week)  Water: water, carries    Exercise: working out with a trainer, GYM 45-60 min twice weekly, videos 2 days a week, strength training  Water intake: 64 fluid ounces x 2-3    Stress: moderate, managing well with support and lifestyle   Sleep: has increased some since last, 5/7 nights getting 6-7 hours, up from 5-6 hours previous  Her blood pressure has been controlled at home, today their BP is    She is taking losartan/hctz 100/25 mg daily, amlodipine 2.5 mg daily.   She does workout. She denies chest pain, shortness of breath, dizziness.  she has a diagnosis of anxiety and is currently on PRN xanax (0.5 mg BID PRN), reports symptoms are well controlled on current regimen. she uses xanax rarely, last filled on 09/19/2018 per PDMP.     She has been working on diet and exercise for prediabetes, and denies increased appetite, nausea, paresthesia of the feet, polydipsia, polyuria and visual disturbances. Last A1C was drawn by [facility] and was 5.9% on 12/26/2018.   Patient is on Vitamin D supplement. Lab Results  Component Value Date   VD25OH 46 08/29/2018     Total 182 HDL 56 LDL was 112 Triglycerides 68   Current  Medications:  Current Outpatient Medications on File Prior to Visit  Medication Sig  . ALPRAZolam (XANAX) 0.5 MG tablet Take 0.5 mg by mouth 2 (two) times daily as needed for anxiety.   Marland Kitchen amLODipine (NORVASC) 2.5 MG tablet Take 2.5 mg by mouth daily.  . Cholecalciferol (D3-50) 1.25 MG (50000 UT) capsule TAKE ONE TABLET BY MOUTH ONCE A WEEK FOR 12 WEEKS.  . diclofenac (FLECTOR) 1.3 % PTCH Place 1 patch onto the skin daily as needed (shoulder pain).  . Digestive Enzymes (DIGESTIVE ENZYME PO) Take 1 capsule by mouth daily.   Marland Kitchen losartan-hydrochlorothiazide (HYZAAR) 100-25 MG tablet Take 1 tablet by mouth daily.  . Multiple Vitamins-Minerals (MULTIVITAMIN ADULT PO) Take by mouth.  . Omega-3 Fatty Acids (FISH OIL) 1000 MG CAPS Take 1 capsule by mouth daily.  . phentermine 37.5 MG capsule Take 1 capsule (37.5 mg total) by mouth every morning.  . triamcinolone ointment (KENALOG) 0.5 % Apply 1 application topically 2 (two) times daily. (Patient taking differently: Apply 1 application topically as needed. )  . vitamin B-12 (CYANOCOBALAMIN) 1000 MCG tablet Take 1,000 mcg by mouth daily.  . Turmeric 500 MG TABS Take 1 capsule by mouth daily.  No current facility-administered medications on file prior to visit.      Allergies:  Allergies  Allergen Reactions  . Morphine Other (See Comments)    Other Reaction: hallucinations Hallucinations   . Echinacea-Golden Seal [Nutritional Supplements]     Specifically tea     Medical History:  Past Medical History:  Diagnosis Date  . Anemia   . Anxiety and depression   . Herpes   . Hypertension    Family history- Reviewed and unchanged Social history- Reviewed and unchanged   Review of Systems:  Review of Systems  Constitutional: Negative for malaise/fatigue and weight loss.  HENT: Negative for hearing loss and tinnitus.   Eyes: Negative for blurred vision and double vision.  Respiratory: Negative for cough, shortness of breath and wheezing.    Cardiovascular: Negative for chest pain, palpitations, orthopnea, claudication and leg swelling.  Gastrointestinal: Negative for abdominal pain, blood in stool, constipation, diarrhea, heartburn, melena, nausea and vomiting.  Genitourinary: Negative.   Musculoskeletal: Negative for joint pain and myalgias.  Skin: Negative for rash.  Neurological: Negative for dizziness, tingling, sensory change, weakness and headaches.  Endo/Heme/Allergies: Negative for polydipsia.  Psychiatric/Behavioral: Negative.   All other systems reviewed and are negative.    Physical Exam: Wt (!) 325 lb 9.6 oz (147.7 kg)   BMI 54.18 kg/m  Wt Readings from Last 3 Encounters:  01/26/19 (!) 325 lb 9.6 oz (147.7 kg)  12/14/18 (!) 327 lb (148.3 kg)  08/29/18 (!) 320 lb (145.2 kg)   General : Well sounding patient in no apparent distress HEENT: no hoarseness, no cough for duration of visit Lungs: speaks in complete sentences, no audible wheezing, no apparent distress Neurological: alert, oriented x 3 Psychiatric: pleasant, judgement appropriate     [name], NP 4:17 PM [facility]

## 2019-01-26 ENCOUNTER — Encounter: Payer: Self-pay | Admitting: Adult Health

## 2019-01-26 ENCOUNTER — Other Ambulatory Visit: Payer: Self-pay

## 2019-01-26 ENCOUNTER — Ambulatory Visit: Payer: BC Managed Care – PPO | Admitting: Adult Health

## 2019-01-26 VITALS — Wt 325.6 lb

## 2019-01-26 DIAGNOSIS — R5383 Other fatigue: Secondary | ICD-10-CM | POA: Diagnosis not present

## 2019-01-26 DIAGNOSIS — E559 Vitamin D deficiency, unspecified: Secondary | ICD-10-CM

## 2019-01-26 DIAGNOSIS — F419 Anxiety disorder, unspecified: Secondary | ICD-10-CM

## 2019-01-26 DIAGNOSIS — I10 Essential (primary) hypertension: Secondary | ICD-10-CM | POA: Diagnosis not present

## 2019-01-26 DIAGNOSIS — Z6841 Body Mass Index (BMI) 40.0 and over, adult: Secondary | ICD-10-CM

## 2019-02-03 ENCOUNTER — Other Ambulatory Visit: Payer: Self-pay | Admitting: Adult Health

## 2019-02-03 DIAGNOSIS — E559 Vitamin D deficiency, unspecified: Secondary | ICD-10-CM

## 2019-02-07 ENCOUNTER — Ambulatory Visit: Payer: BC Managed Care – PPO

## 2019-02-09 ENCOUNTER — Ambulatory Visit
Admission: RE | Admit: 2019-02-09 | Discharge: 2019-02-09 | Disposition: A | Discharge status: Home / Self Care | Payer: BC Managed Care – PPO | Source: Ambulatory Visit | Attending: Family Medicine | Admitting: Family Medicine

## 2019-02-09 ENCOUNTER — Other Ambulatory Visit: Payer: Self-pay

## 2019-02-09 DIAGNOSIS — Z1231 Encounter for screening mammogram for malignant neoplasm of breast: Secondary | ICD-10-CM

## 2019-03-08 ENCOUNTER — Encounter: Payer: Self-pay | Admitting: Adult Health

## 2019-03-08 NOTE — Progress Notes (Signed)
FOLLOW UP  Assessment and Plan:   Hypertension Fairly controlled with current medications Monitor blood pressure at home; patient to call if consistently greater than 130/80 Continue DASH diet.   Reminder to go to the ER if any CP, SOB, nausea, dizziness, severe HA, changes vision/speech, left arm numbness and tingling and jaw pain.  Morbid Obesity with co morbidities Long discussion counseling about weight loss, diet, and exercise Recommended diet heavy in fruits and veggies and low in animal meats, cheeses, and dairy products, appropriate calorie intake Discussed ideal weight for height; first weight goal is <300 lb Patient will continue with portion control, stress management, prioritizing sleep, exercise She is working with [employer] app and Physiological scientist;  Working to establish good lifestyle habits rather than "diet"  Patient on phentermine with benefit and no SE, taking drug breaks; continue close follow up. Return in 1 month Will add topamax 25-50 mg daily after discussion with patient of risks and benefits She continues to feel monthly visits are beneficial for weight loss goals and accountability  Vitamin D Def On 50000 IU weekly with significant perceived benefit - 64 when checked at [facility] continue supplementation to maintain goal of 60-100  Fatigue  Resolved with vitamin D supplement addition Continue lifestyle modifcation  Continue diet and meds as discussed. Further disposition pending results of labs. Discussed med's effects and SE's.   Over 30 minutes of exam, counseling, chart review, and critical decision making was performed.   Future Appointments  Date Time Provider [place]  04/10/2019  2:30 PM Vicie Mutters, PA-C [facility] None    ----------------------------------------------------------------------------------------------------------------------  HPI BP 130/80   Pulse 83   Temp 97.7 F (36.5 C)   Wt (!) 326 lb 3.2 oz (148 kg)   SpO2  98%   BMI 54.28 kg/m   48 y.o. female  presents for 1 month follow up on hypertension, morbid obesity, anxiety.   She reports started noom app, down on home scale from 328 lb down to 322 lb as of this morning over the last month. She is also newly tracking steps with a pedometer, up from 500 to 3000 steps when not going to gym, trying to work up to getting 6000 steps daily, setting a timer on her work computer to remind herself to get up and move.    she is prescribed phentermine for weight loss, newly restarted after last visit, reports has been taking for about 2 weeks now. Started via weight loss clinic last year, did note significant improvement initially, lost 15 lb in 6 months, but hadn't followed up with them and after discussion has requested we follow up monthly here for continued monitoring. She has successfully gotten down to 250 lb in 2013 with exercise, phentermine.   While on the medication they have lost 0 lbs since last visit on our scale. They deny palpitations, anxiety, trouble sleeping, elevated BP.   BMI is Body mass index is 54.28 kg/m., she is working on exercise, was doing videos while social distancing, now back working with a trainer does 45-60 min, 3-5 days a week, increased energy.  She reports she has noted improvement in clothing fitment, down 1-2 jean sizes   She has been working on portion sizes, using a scale and measuring cups.  She continues to push water intake Doing once a week shopping after meal planning keeping bad things out of the house.   Tracking food via noom app   Wt Readings from Last 3 Encounters:  03/09/19 Marland Kitchen)  326 lb 3.2 oz (148 kg)  01/26/19 (!) 325 lb 9.6 oz (147.7 kg)  12/14/18 (!) 327 lb (148.3 kg)    Water intake: 64 fluid ounces   Stress: moderate, managing well with support and lifestyle   Sleep: has increased, 5/7 nights getting 6-7 hours, up from 5-6 hours previous  Her blood pressure has been controlled at home, today their  BP is BP: 130/80  She is taking losartan/hctz 100/25 mg daily, amlodipine 2.5 mg daily.   She does workout. She denies chest pain, shortness of breath, dizziness.  she has a diagnosis of anxiety and is currently on PRN xanax (0.5 mg BID PRN), reports symptoms are well controlled on current regimen. she uses xanax rarely, last filled on 09/19/2018 per PDMP.     She has been working on diet and exercise for prediabetes, and denies increased appetite, nausea, paresthesia of the feet, polydipsia, polyuria and visual disturbances. Last A1C was drawn by [facility] and was 5.9% on 12/26/2018. Will be getting labs via provider in [place] in December.   Patient is on Vitamin D supplement, notes significant energy benefits when taking . Lab Results  Component Value Date   VD25OH 46 08/29/2018     Last cholesterol:   Total 182 HDL 56 LDL was 112 Triglycerides 68   Current Medications:  Current Outpatient Medications on File Prior to Visit  Medication Sig  . ALPRAZolam (XANAX) 0.5 MG tablet Take 0.5 mg by mouth 2 (two) times daily as needed for anxiety.   Marland Kitchen amLODipine (NORVASC) 2.5 MG tablet Take 2.5 mg by mouth daily.  . Cholecalciferol (VITAMIN D3) 1.25 MG (50000 UT) CAPS Take 1 capsule every week for Vitamin D Deficiency  . diclofenac (FLECTOR) 1.3 % PTCH Place 1 patch onto the skin daily as needed (shoulder pain).  Marland Kitchen losartan-hydrochlorothiazide (HYZAAR) 100-25 MG tablet Take 1 tablet by mouth daily.  . Multiple Vitamins-Minerals (MULTIVITAMIN ADULT PO) Take by mouth.  . Omega-3 Fatty Acids (FISH OIL) 1000 MG CAPS Take 1 capsule by mouth daily.  . phentermine 37.5 MG capsule Take 1 capsule (37.5 mg total) by mouth every morning.  . triamcinolone ointment (KENALOG) 0.5 % Apply 1 application topically 2 (two) times daily. (Patient taking differently: Apply 1 application topically as needed. )  . vitamin B-12 (CYANOCOBALAMIN) 1000 MCG tablet Take 1,000 mcg by mouth daily.  . Digestive Enzymes  (DIGESTIVE ENZYME PO) Take 1 capsule by mouth daily.   . Turmeric 500 MG TABS Take 1 capsule by mouth daily.    No current facility-administered medications on file prior to visit.      Allergies:  Allergies  Allergen Reactions  . Morphine Other (See Comments)    Other Reaction: hallucinations Hallucinations   . Echinacea-Golden Seal [Nutritional Supplements]     Specifically tea     Medical History:  Past Medical History:  Diagnosis Date  . Anemia   . Anxiety and depression   . Fatigue 12/13/2018  . Herpes   . Hypertension    Family history- Reviewed and unchanged Social history- Reviewed and unchanged   Review of Systems:  Review of Systems  Constitutional: Negative for malaise/fatigue and weight loss.  HENT: Negative for hearing loss and tinnitus.   Eyes: Negative for blurred vision and double vision.  Respiratory: Negative for cough, shortness of breath and wheezing.   Cardiovascular: Negative for chest pain, palpitations, orthopnea, claudication and leg swelling.  Gastrointestinal: Negative for abdominal pain, blood in stool, constipation, diarrhea, heartburn, melena, nausea and  vomiting.  Genitourinary: Negative.   Musculoskeletal: Negative for joint pain and myalgias.  Skin: Negative for rash.  Neurological: Negative for dizziness, tingling, sensory change, weakness and headaches.  Endo/Heme/Allergies: Negative for polydipsia.  Psychiatric/Behavioral: Negative.   All other systems reviewed and are negative.    Physical Exam: BP 130/80   Pulse 83   Temp 97.7 F (36.5 C)   Wt (!) 326 lb 3.2 oz (148 kg)   SpO2 98%   BMI 54.28 kg/m  Wt Readings from Last 3 Encounters:  03/09/19 (!) 326 lb 3.2 oz (148 kg)  01/26/19 (!) 325 lb 9.6 oz (147.7 kg)  12/14/18 (!) 327 lb (148.3 kg)   General : Well sounding patient in no apparent distress HEENT: no hoarseness, no cough for duration of visit Lungs: speaks in complete sentences, no audible wheezing, no apparent  distress Neurological: alert, oriented x 3 Psychiatric: pleasant, judgement appropriate     [name], NP 4:52 PM [facility]

## 2019-03-09 ENCOUNTER — Encounter: Payer: Self-pay | Admitting: Adult Health

## 2019-03-09 ENCOUNTER — Other Ambulatory Visit: Payer: Self-pay

## 2019-03-09 ENCOUNTER — Ambulatory Visit (INDEPENDENT_AMBULATORY_CARE_PROVIDER_SITE_OTHER): Payer: BC Managed Care – PPO | Admitting: Adult Health

## 2019-03-09 VITALS — BP 130/80 | HR 83 | Temp 97.7°F | Wt 326.2 lb

## 2019-03-09 DIAGNOSIS — R5383 Other fatigue: Secondary | ICD-10-CM | POA: Diagnosis not present

## 2019-03-09 DIAGNOSIS — F419 Anxiety disorder, unspecified: Secondary | ICD-10-CM | POA: Diagnosis not present

## 2019-03-09 DIAGNOSIS — Z6841 Body Mass Index (BMI) 40.0 and over, adult: Secondary | ICD-10-CM

## 2019-03-09 DIAGNOSIS — I10 Essential (primary) hypertension: Secondary | ICD-10-CM | POA: Diagnosis not present

## 2019-03-09 DIAGNOSIS — E559 Vitamin D deficiency, unspecified: Secondary | ICD-10-CM

## 2019-03-09 MED ORDER — TOPIRAMATE 25 MG PO TABS: ORAL_TABLET | ORAL | 2 refills | Status: DC

## 2019-03-09 NOTE — Patient Instructions (Addendum)
Try to get at least 7 hours of sleep  Bring meal print outs next visit  Try topiramate 25 mg with dinner, then in a week or two can add a second tab an hour prior to bedtime     Who Qualifies for Obesity Medications? Although everyone is hopeful for a fast and easy way to lose weight, nothing has been shown to replace a prudent, calorie-controlled diet along with behavior modification as a cornerstone for all obesity treatments.  The next tool that can be used to achieve weight-loss and health improvement is medication. Pharmacotherapy may be offered to individuals affected by obesity who have failed to achieve weight-loss through diet and exercise alone. Currently there are several drugs that are approved by the FDA for weight-loss: . phentermine products (Adipex-P or Suprenza)  . phentermine- topiramate ER (Qsymia)  . Bupropion; Naltrexone ER (Contrave)  . Saxenda (liraglutide), once a day weight loss shot Let's take a closer look at each of these medications and learn how they work:   Phentermine-Topiramate ER (Qsymia) How does it work? This combination medication was approved by the FDA in July 2012. Topiramate is a medication used to treat seizures. It was found that a common side effect of this medication was weight-loss. Phentermine, as described in this brochure, helps to increase your energy and decrease your appetite.  Concerns: The most common side effects were dry mouth, constipation and pins-and-needle feeling in extremities.  Qsymia should NOT be taken during pregnancy since Topiramate ER, a component of Qsymia, has been associated with an increased risk of birth defects.  MEDIATIONS SEPARATED: PHENTERMINE How does it work? Phentermine is a medication available by prescription that works on chemicals in the brain to decrease your appetite. It also has a mild stimulant component that adds extra energy. Phentermine is a pill that is taken once a day in the morning time.    Tolerance to this medication normally develops, so it can only be used for several months at a time.  Common side effects are dry mouth, sleeplessness, constipation.  Concerns: Due to its stimulant effect, a person's blood pressure and heart rate may increase when on this medication; therefore, you must be monitored closely by a physician who is experienced in prescribing this medication. It cannot be used in patients with some heart conditions (such as poorly controlled blood pressure), glaucoma (increased pressure in your eye), stroke or overactive thyroid. There is some concern for abuse, but this is minimal if the medication is appropriately used as directed by a healthcare professional.  TOPIRAMATE Sometimes we will prescribe topiramate AND phentermine together to make Qsymia- separating the medications makes it cheaper.  How to start it start on 1/2 pill for 3-5 nights, can increase to a whole pill for 1-2 weeks.  This medication is good for weight loss, headaches, pain This medication can cause numbness, tingling and can cause brain fog- stop if you get these Check with your eye doctor if you have a history of glaucoma and stop if you severe vision changes or blurry vision.    Bupropion; Naltrexone ER (Contrave) How does it work? Works in two areas of your brain, hunger center and reward center to reduce hunger and cravings.   Concerns Most common side effects are dry mouth, constipation or diarrhea, headache.  Please take it with a full glass of water and low fat meal.   MEDICATIONS SEPARATED: WELLBUTRIN Only antidepressant that helps with weight loss Used frequently for seasonal effective disorder If you  get nausea and HA after the first week please stop- can cause people to clench their jaw- stop it.  If you get anxious or snappy with people than stop the medication But this medication can help with energy, weight loss and mood It kicks in about 1-2 weeks And can be  stopped quickly  NALTREXONE You can NOT take this medication if you are on an opioid.  It is used to treat patients that have cravings/addiction to alcohol/opioids.  Taken with supper/dinner.   SAXENDA Is an expensive once a day injectable that helps decrease appetite.  You usually have to fail other therapies and show 6 months of trying before we can get it approved.  Can cause nausea.    Follow-up Visits: Patients are given the opportunity to revisit a topic or obtain more information on an area of interest during follow-up visits. The frequency of and interval between follow-up visits is determined on a patient-by-patient basis. Frequent visits (every 3 to 4 weeks) are encouraged until initial weight-loss goals (5 to 10 percent of body weight) are achieved. At that point, less frequent visits are typically scheduled as needed for individual patients. However, since obesity is considered a chronic life-long problem for many individuals, periodic continual follow up is recommended.   Research has shown that weight-loss as low as 5 percent of initial body weight can lead to favorable improvements in blood pressure, cholesterol, glucose levels and insulin sensitivity. The risk of developing heart disease is reduced the most in patients who have impaired glucose tolerance, type 2 diabetes or high blood pressure.       Topiramate tablets What is this medicine? TOPIRAMATE (toe PYRE a mate) is used to treat seizures in adults or children with epilepsy. It is also used for the prevention of migraine headaches. This medicine may be used for other purposes; ask your health care provider or pharmacist if you have questions. COMMON BRAND NAME(S): Topamax, Topiragen What should I tell my health care provider before I take this medicine? They need to know if you have any of these conditions:  bleeding disorders  cirrhosis of the liver or liver disease  diarrhea  glaucoma  kidney stones or  kidney disease  low blood counts, like low white cell, platelet, or red cell counts  lung disease like asthma, obstructive pulmonary disease, emphysema  metabolic acidosis  on a ketogenic diet  schedule for surgery or a procedure  suicidal thoughts, plans, or attempt; a previous suicide attempt by you or a family member  an unusual or allergic reaction to topiramate, other medicines, foods, dyes, or preservatives  pregnant or trying to get pregnant  breast-feeding How should I use this medicine? Take this medicine by mouth with a glass of water. Follow the directions on the prescription label. Do not crush or chew. You may take this medicine with meals. Take your medicine at regular intervals. Do not take it more often than directed. Talk to your pediatrician regarding the use of this medicine in children. Special care may be needed. While this drug may be prescribed for children as young as 59 years of age for selected conditions, precautions do apply. Overdosage: If you think you have taken too much of this medicine contact a poison control center or emergency room at once. NOTE: This medicine is only for you. Do not share this medicine with others. What if I miss a dose? If you miss a dose, take it as soon as you can. If your next dose  is to be taken in less than 6 hours, then do not take the missed dose. Take the next dose at your regular time. Do not take double or extra doses. What may interact with this medicine? Do not take this medicine with any of the following medications:  probenecid This medicine may also interact with the following medications:  acetazolamide  alcohol  amitriptyline  aspirin and aspirin-like medicines  birth control pills  certain medicines for depression  certain medicines for seizures  certain medicines that treat or prevent blood clots like warfarin, enoxaparin, dalteparin, apixaban, dabigatran, and  rivaroxaban  digoxin  hydrochlorothiazide  lithium  medicines for pain, sleep, or muscle relaxation  metformin  methazolamide  NSAIDS, medicines for pain and inflammation, like ibuprofen or naproxen  pioglitazone  risperidone This list may not describe all possible interactions. Give your health care provider a list of all the medicines, herbs, non-prescription drugs, or dietary supplements you use. Also tell them if you smoke, drink alcohol, or use illegal drugs. Some items may interact with your medicine. What should I watch for while using this medicine? Visit your doctor or health care professional for regular checks on your progress. Do not stop taking this medicine suddenly. This increases the risk of seizures if you are using this medicine to control epilepsy. Wear a medical identification bracelet or chain to say you have epilepsy or seizures, and carry a card that lists all your medicines. This medicine can decrease sweating and increase your body temperature. Watch for signs of deceased sweating or fever, especially in children. Avoid extreme heat, hot baths, and saunas. Be careful about exercising, especially in hot weather. Contact your health care provider right away if you notice a fever or decrease in sweating. You should drink plenty of fluids while taking this medicine. If you have had kidney stones in the past, this will help to reduce your chances of forming kidney stones. If you have stomach pain, with nausea or vomiting and yellowing of your eyes or skin, call your doctor immediately. You may get drowsy, dizzy, or have blurred vision. Do not drive, use machinery, or do anything that needs mental alertness until you know how this medicine affects you. To reduce dizziness, do not sit or stand up quickly, especially if you are an older patient. Alcohol can increase drowsiness and dizziness. Avoid alcoholic drinks. If you notice blurred vision, eye pain, or other eye  problems, seek medical attention at once for an eye exam. The use of this medicine may increase the chance of suicidal thoughts or actions. Pay special attention to how you are responding while on this medicine. Any worsening of mood, or thoughts of suicide or dying should be reported to your health care professional right away. This medicine may increase the chance of developing metabolic acidosis. If left untreated, this can cause kidney stones, bone disease, or slowed growth in children. Symptoms include breathing fast, fatigue, loss of appetite, irregular heartbeat, or loss of consciousness. Call your doctor immediately if you experience any of these side effects. Also, tell your doctor about any surgery you plan on having while taking this medicine since this may increase your risk for metabolic acidosis. Birth control pills may not work properly while you are taking this medicine. Talk to your doctor about using an extra method of birth control. Women who become pregnant while using this medicine may enroll in the [place] Pregnancy Registry by calling [phone]. This registry collects information about the safety  of antiepileptic drug use during pregnancy. What side effects may I notice from receiving this medicine? Side effects that you should report to your doctor or health care professional as soon as possible:  allergic reactions like skin rash, itching or hives, swelling of the face, lips, or tongue  decreased sweating and/or rise in body temperature  depression  difficulty breathing, fast or irregular breathing patterns  difficulty speaking  difficulty walking or controlling muscle movements  hearing impairment  redness, blistering, peeling or loosening of the skin, including inside the mouth  tingling, pain or numbness in the hands or feet  unusual bleeding or bruising  unusually weak or tired  worsening of mood, thoughts or actions of suicide  or dying Side effects that usually do not require medical attention (report to your doctor or health care professional if they continue or are bothersome):  altered taste  back pain, joint or muscle aches and pains  diarrhea, or constipation  headache  loss of appetite  nausea  stomach upset, indigestion  tremors This list may not describe all possible side effects. Call your doctor for medical advice about side effects. You may report side effects to FDA at 1-800-FDA-1088. Where should I keep my medicine? Keep out of the reach of children. Store at room temperature between 15 and 30 degrees C (59 and 86 degrees F) in a tightly closed container. Protect from moisture. Throw away any unused medicine after the expiration date. NOTE: This sheet is a summary. It may not cover all possible information. If you have questions about this medicine, talk to your doctor, pharmacist, or health care provider.  2020 [employer]vier/Gold Standard (2013-06-26 23:17:57)

## 2019-04-06 NOTE — Progress Notes (Deleted)
FOLLOW UP  Assessment and Plan:   Hypertension Fairly controlled with current medications Monitor blood pressure at home; patient to call if consistently greater than 130/80 Continue DASH diet.   Reminder to go to the ER if any CP, SOB, nausea, dizziness, severe HA, changes vision/speech, left arm numbness and tingling and jaw pain.  Morbid Obesity with co morbidities Long discussion counseling about weight loss, diet, and exercise Recommended diet heavy in fruits and veggies and low in animal meats, cheeses, and dairy products, appropriate calorie intake Discussed ideal weight for height; first weight goal is <300 lb Patient will continue with portion control, stress management, prioritizing sleep, exercise She is working with No[employer] app and Physiological scientist;  Working to establish good lifestyle habits rather than "diet"  Patient on phentermine with benefit and no SE, taking drug breaks; continue close follow up. Return in 1 month Will add topamax 25-50 mg daily after discussion with patient of risks and benefits She continues to feel monthly visits are beneficial for weight loss goals and accountability  Vitamin D Def On 50000 IU weekly with significant perceived benefit - 64 when checked at [facility] continue supplementation to maintain goal of 60-100  Fatigue  Resolved with vitamin D supplement addition Continue lifestyle modifcation  Continue diet and meds as discussed. Further disposition pending results of labs. Discussed med's effects and SE's.   Over 30 minutes of exam, counseling, chart review, and critical decision making was performed.   Future Appointments  Date Time Provider Stella  04/10/2019  2:30 PM Vicie Mutters, PA-C [id number] None    ----------------------------------------------------------------------------------------------------------------------  HPI There were no vitals taken for this visit.  48 y.o. female  presents for 1 month follow  up on hypertension, morbid obesity, anxiety.   She reports started noom app, down on home scale from 328 lb down to 322 lb as of this morning over the last month. She is also newly tracking steps with a pedometer, up from 500 to 3000 steps when not going to gym, trying to work up to getting 6000 steps daily, setting a timer on her work computer to remind herself to get up and move.    she is prescribed phentermine and topamax for weight loss, newly restarted after last visit, reports has been taking for about 2 weeks now. Started via weight loss clinic last year, did note significant improvement initially, lost 15 lb in 6 months, but hadn't followed up with them and after discussion has requested we follow up monthly here for continued monitoring. She has successfully gotten down to 250 lb in 2013 with exercise, phentermine.   While on the medication they have lost 0 lbs since last visit on our scale. They deny palpitations, anxiety, trouble sleeping, elevated BP.   BMI is There is no height or weight on file to calculate BMI., she is working on exercise, was doing videos while social distancing, now back working with a trainer does 45-60 min, 3-5 days a week, increased energy.  She reports she has noted improvement in clothing fitment, down 1-2 jean sizes   She has been working on portion sizes, using a scale and measuring cups.  She continues to push water intake Doing once a week shopping after meal planning keeping bad things out of the house.   Tracking food via noom app   Wt Readings from Last 3 Encounters:  03/09/19 (!) 326 lb 3.2 oz (148 kg)  01/26/19 (!) 325 lb 9.6 oz (147.7 kg)  12/14/18 Marland Kitchen)  327 lb (148.3 kg)    Water intake: 64 fluid ounces - goal 80oz  Stress: moderate, managing well with support and lifestyle   Sleep: has increased, 5/7 nights getting 6-7 hours, up from 5-6 hours previous  Her blood pressure has been controlled at home, today their BP is    She is taking  losartan/hctz 100/25 mg daily, amlodipine 2.5 mg daily.   She does workout. She denies chest pain, shortness of breath, dizziness.   She has been working on diet and exercise for prediabetes, and denies increased appetite, nausea, paresthesia of the feet, polydipsia, polyuria and visual disturbances. Last A1C was drawn by [facility] and was 5.9% on 12/26/2018. Will be getting labs via provider in [place] in December.   Patient is on Vitamin D supplement, notes significant energy benefits when taking . Lab Results  Component Value Date   VD25OH 46 08/29/2018      Current Medications:  Current Outpatient Medications on File Prior to Visit  Medication Sig  . ALPRAZolam (XANAX) 0.5 MG tablet Take 0.5 mg by mouth 2 (two) times daily as needed for anxiety.   Marland Kitchen amLODipine (NORVASC) 2.5 MG tablet Take 2.5 mg by mouth daily.  . Cholecalciferol (VITAMIN D3) 1.25 MG (50000 UT) CAPS Take 1 capsule every week for Vitamin D Deficiency  . diclofenac (FLECTOR) 1.3 % PTCH Place 1 patch onto the skin daily as needed (shoulder pain).  . Digestive Enzymes (DIGESTIVE ENZYME PO) Take 1 capsule by mouth daily.   Marland Kitchen losartan-hydrochlorothiazide (HYZAAR) 100-25 MG tablet Take 1 tablet by mouth daily.  . Multiple Vitamins-Minerals (MULTIVITAMIN ADULT PO) Take by mouth.  . Omega-3 Fatty Acids (FISH OIL) 1000 MG CAPS Take 1 capsule by mouth daily.  . phentermine 37.5 MG capsule Take 1 capsule (37.5 mg total) by mouth every morning.  . topiramate (TOPAMAX) 25 MG tablet 1-2 at bedtime  . triamcinolone ointment (KENALOG) 0.5 % Apply 1 application topically 2 (two) times daily. (Patient taking differently: Apply 1 application topically as needed. )  . Turmeric 500 MG TABS Take 1 capsule by mouth daily.   . vitamin B-12 (CYANOCOBALAMIN) 1000 MCG tablet Take 1,000 mcg by mouth daily.   No current facility-administered medications on file prior to visit.      Allergies:  Allergies  Allergen Reactions  . Morphine Other  (See Comments)    Other Reaction: hallucinations Hallucinations   . Echinacea-Golden Seal [Nutritional Supplements]     Specifically tea     Medical History:  Past Medical History:  Diagnosis Date  . Anemia   . Anxiety and depression   . Fatigue 12/13/2018  . Herpes   . Hypertension    Family history- Reviewed and unchanged Social history- Reviewed and unchanged   Review of Systems:  Review of Systems  Constitutional: Negative for malaise/fatigue and weight loss.  HENT: Negative for hearing loss and tinnitus.   Eyes: Negative for blurred vision and double vision.  Respiratory: Negative for cough, shortness of breath and wheezing.   Cardiovascular: Negative for chest pain, palpitations, orthopnea, claudication and leg swelling.  Gastrointestinal: Negative for abdominal pain, blood in stool, constipation, diarrhea, heartburn, melena, nausea and vomiting.  Genitourinary: Negative.   Musculoskeletal: Negative for joint pain and myalgias.  Skin: Negative for rash.  Neurological: Negative for dizziness, tingling, sensory change, weakness and headaches.  Endo/Heme/Allergies: Negative for polydipsia.  Psychiatric/Behavioral: Negative.   All other systems reviewed and are negative.    Physical Exam: There were no vitals taken for  this visit. Wt Readings from Last 3 Encounters:  03/09/19 (!) 326 lb 3.2 oz (148 kg)  01/26/19 (!) 325 lb 9.6 oz (147.7 kg)  12/14/18 (!) 327 lb (148.3 kg)   General : Well sounding patient in no apparent distress HEENT: no hoarseness, no cough for duration of visit Lungs: speaks in complete sentences, no audible wheezing, no apparent distress Neurological: alert, oriented x 3 Psychiatric: pleasant, judgement appropriate     Vicie Mutters, PA-C 1:37 PM [facility]

## 2019-04-10 ENCOUNTER — Ambulatory Visit (INDEPENDENT_AMBULATORY_CARE_PROVIDER_SITE_OTHER): Payer: BC Managed Care – PPO

## 2019-04-10 ENCOUNTER — Other Ambulatory Visit: Payer: Self-pay

## 2019-04-10 ENCOUNTER — Ambulatory Visit: Payer: BC Managed Care – PPO | Admitting: Physician Assistant

## 2019-04-10 VITALS — Temp 97.6°F

## 2019-04-10 DIAGNOSIS — Z23 Encounter for immunization: Secondary | ICD-10-CM

## 2019-04-10 NOTE — Progress Notes (Signed)
REPORTS FOR LOW DOSE FLU

## 2019-04-11 ENCOUNTER — Encounter: Payer: Self-pay | Admitting: Adult Health

## 2019-04-11 ENCOUNTER — Ambulatory Visit: Payer: BC Managed Care – PPO | Admitting: Adult Health

## 2019-04-11 DIAGNOSIS — Z6841 Body Mass Index (BMI) 40.0 and over, adult: Secondary | ICD-10-CM

## 2019-04-11 DIAGNOSIS — F419 Anxiety disorder, unspecified: Secondary | ICD-10-CM | POA: Diagnosis not present

## 2019-04-11 DIAGNOSIS — E559 Vitamin D deficiency, unspecified: Secondary | ICD-10-CM | POA: Diagnosis not present

## 2019-04-11 DIAGNOSIS — I10 Essential (primary) hypertension: Secondary | ICD-10-CM | POA: Diagnosis not present

## 2019-04-11 NOTE — Progress Notes (Signed)
Virtual Visit via Telephone Note  I connected with Mary Banks on 04/11/19 at  3:45 PM EDT by telephone and verified that I am speaking with the correct person using two identifiers.  Location: Patient: home Provider: [place] office    I discussed the limitations, risks, security and privacy concerns of performing an evaluation and management service by telephone and the availability of in person appointments. I also discussed with the patient that there may be a patient responsible charge related to this service. The patient expressed understanding and agreed to proceed.   I discussed the assessment and treatment plan with the patient. The patient was provided an opportunity to ask questions and all were answered. The patient agreed with the plan and demonstrated an understanding of the instructions.   The patient was advised to call back or seek an in-person evaluation if the symptoms worsen or if the condition fails to improve as anticipated.  I provided 25 minutes of non-face-to-face time during this encounter.   [name], NP    FOLLOW UP  Assessment and Plan:   Hypertension ? Elevated value today with new home cuff, ? Accuracy, she will recheck at pharmacy Has been well controlled by manual checks here in office  Monitor blood pressure at home; patient to call if consistently greater than 130/80 Continue DASH diet.   Reminder to go to the ER if any CP, SOB, nausea, dizziness, severe HA, changes vision/speech, left arm numbness and tingling and jaw pain.  Morbid Obesity with co morbidities Long discussion counseling about weight loss, diet, and exercise Recommended diet heavy in fruits and veggies and low in animal meats, cheeses, and dairy products, appropriate calorie intake Discussed ideal weight for height; first weight goal is <300 lb Patient will continue with portion control, stress management, prioritizing sleep, exercise She is working with Noom app and  Physiological scientist; excellent progress Working to establish good lifestyle habits rather than "diet"  Patient on phentermine and topamax with benefit and no SE, taking drug breaks; continue close follow up. Return in 1 month She continues to feel monthly visits are beneficial for weight loss goals and accountability  Vitamin D Def On 50000 IU weekly with significant perceived benefit - 64 when checked at [facility] continue supplementation to maintain goal of 60-100  Continue diet and meds as discussed. Further disposition pending results of labs. Discussed med's effects and SE's.   Over 30 minutes of exam, counseling, chart review, and critical decision making was performed.   No future appointments.  ----------------------------------------------------------------------------------------------------------------------  HPI BP (!) 126/99   Pulse 86   Wt (!) 317 lb 12.8 oz (144.2 kg)   BMI 52.88 kg/m   48 y.o. female  presents for 1 month follow up on hypertension, morbid obesity, anxiety.   She reports started noom app, down on home scale from 322 lb to 317 lb as of this morning over the last month.    she is prescribed phentermine 37.5 mg 4-5 days a week and topamax 25 mg for weight loss, newly restarted on in 02/2019 and has been taking for about 6 weeks now. Started via weight loss clinic last year, did note improvement initially, lost 15 lb in 6 months, but hadn't followed up with them and after discussion has requested we follow up monthly here for continued monitoring. She has successfully gotten down to 250 lb in 2013 with exercise, phentermine.   While on the medication they have lost 5 lbs since last visit on  our scale. She denes palpitations, anxiety, trouble sleeping, elevated BP.    BMI is Body mass index is 52.88 kg/m., she is working on exercise, walking twice a day 7 days a week, back to working with a trainer does 45-60 min, 2 days a week, increased energy. She reports she  has noted improvement in clothing fitment, down 1-2 jean sizes.  She has been working on portion sizes, using a scale and measuring cups.  She continues to push water intake Doing once a week shopping after meal planning; having fun with new recipes keeping bad things out of the house.    Tracking food intake via noom app   Wt Readings from Last 3 Encounters:  04/11/19 (!) 317 lb 12.8 oz (144.2 kg)  03/09/19 (!) 326 lb 3.2 oz (148 kg)  01/26/19 (!) 325 lb 9.6 oz (147.7 kg)   Water intake: 64 fluid ounces   Stress: moderate, managing well with support and lifestyle   Sleep: has increased, getting 6-7 hours, up from 5-6 hours previous  Today their BP is BP: (!) 126/99 - new home cuff, ? Accuracy  She is taking losartan/hctz 100/25 mg daily, amlodipine 2.5 mg daily.   She does workout. She denies chest pain, shortness of breath, dizziness.  she has a diagnosis of anxiety and is currently on PRN xanax (0.5 mg BID PRN), reports symptoms are well controlled on current regimen. she uses xanax rarely, last filled on 09/19/2018 per PDMP.     She has been working on diet and exercise for prediabetes, and denies increased appetite, nausea, paresthesia of the feet, polydipsia, polyuria and visual disturbances. Last A1C was drawn by [facility] and was 5.9% on 12/26/2018. Will be getting labs via provider in [place] in December.   Patient is on Vitamin D supplement, notes significant energy benefits when taking . Lab Results  Component Value Date   VD25OH 46 08/29/2018     Last cholesterol values in 12/2018 per patient by PCP in [place]:   Total 182 HDL 56 LDL was 112 Triglycerides 68    Current Medications:  Current Outpatient Medications on File Prior to Visit  Medication Sig  . ALPRAZolam (XANAX) 0.5 MG tablet Take 0.5 mg by mouth 2 (two) times daily as needed for anxiety.   Marland Kitchen amLODipine (NORVASC) 2.5 MG tablet Take 2.5 mg by mouth daily.  . cetirizine (ZYRTEC) 10 MG tablet Take 10 mg by  mouth daily.  . Cholecalciferol (VITAMIN D3) 1.25 MG (50000 UT) CAPS Take 1 capsule every week for Vitamin D Deficiency  . losartan-hydrochlorothiazide (HYZAAR) 100-25 MG tablet Take 1 tablet by mouth daily.  . Multiple Vitamins-Minerals (MULTIVITAMIN ADULT PO) Take by mouth.  . Omega-3 Fatty Acids (FISH OIL) 1000 MG CAPS Take 1 capsule by mouth daily.  . phentermine 37.5 MG capsule Take 1 capsule (37.5 mg total) by mouth every morning.  . topiramate (TOPAMAX) 25 MG tablet 1-2 at bedtime  . triamcinolone ointment (KENALOG) 0.5 % Apply 1 application topically 2 (two) times daily. (Patient taking differently: Apply 1 application topically as needed. )  . Turmeric 500 MG TABS Take 1 capsule by mouth daily.   . vitamin B-12 (CYANOCOBALAMIN) 1000 MCG tablet Take 1,000 mcg by mouth daily.  . diclofenac (FLECTOR) 1.3 % PTCH Place 1 patch onto the skin daily as needed (shoulder pain).  . Digestive Enzymes (DIGESTIVE ENZYME PO) Take 1 capsule by mouth daily.    No current facility-administered medications on file prior to visit.  Allergies:  Allergies  Allergen Reactions  . Morphine Other (See Comments)    Other Reaction: hallucinations Hallucinations   . Echinacea-Golden Seal [Nutritional Supplements]     Specifically tea     Medical History:  Past Medical History:  Diagnosis Date  . Anemia   . Anxiety and depression   . Fatigue 12/13/2018  . Herpes   . Hypertension    Family history- Reviewed and unchanged Social history- Reviewed and unchanged   Review of Systems:  Review of Systems  Constitutional: Negative for malaise/fatigue and weight loss.  HENT: Negative for hearing loss and tinnitus.   Eyes: Negative for blurred vision and double vision.  Respiratory: Negative for cough, shortness of breath and wheezing.   Cardiovascular: Negative for chest pain, palpitations, orthopnea, claudication and leg swelling.  Gastrointestinal: Negative for abdominal pain, blood in stool,  constipation, diarrhea, heartburn, melena, nausea and vomiting.  Genitourinary: Negative.   Musculoskeletal: Negative for joint pain and myalgias.  Skin: Negative for rash.  Neurological: Negative for dizziness, tingling, sensory change, weakness and headaches.  Endo/Heme/Allergies: Negative for polydipsia.  Psychiatric/Behavioral: Negative.   All other systems reviewed and are negative.    Physical Exam: BP (!) 126/99   Pulse 86   Wt (!) 317 lb 12.8 oz (144.2 kg)   BMI 52.88 kg/m  Wt Readings from Last 3 Encounters:  04/11/19 (!) 317 lb 12.8 oz (144.2 kg)  03/09/19 (!) 326 lb 3.2 oz (148 kg)  01/26/19 (!) 325 lb 9.6 oz (147.7 kg)   General : Well sounding patient in no apparent distress HEENT: no hoarseness, no cough for duration of visit Lungs: speaks in complete sentences, no audible wheezing, no apparent distress Neurological: alert, oriented x 3 Psychiatric: pleasant, judgement appropriate     [name], NP 4:21 PM [facility]

## 2019-04-16 ENCOUNTER — Other Ambulatory Visit: Payer: Self-pay | Admitting: Adult Health

## 2019-05-22 DIAGNOSIS — R7303 Prediabetes: Secondary | ICD-10-CM | POA: Insufficient documentation

## 2019-05-22 DIAGNOSIS — R7309 Other abnormal glucose: Secondary | ICD-10-CM | POA: Insufficient documentation

## 2019-05-22 NOTE — Progress Notes (Deleted)
Virtual Visit via Telephone Note  I connected with Mary Banks on 05/22/19 at  9:30 AM EST by telephone and verified that I am speaking with the correct person using two identifiers.  Location: Patient: home Provider: [place] office    I discussed the limitations, risks, security and privacy concerns of performing an evaluation and management service by telephone and the availability of in person appointments. I also discussed with the patient that there may be a patient responsible charge related to this service. The patient expressed understanding and agreed to proceed.   I discussed the assessment and treatment plan with the patient. The patient was provided an opportunity to ask questions and all were answered. The patient agreed with the plan and demonstrated an understanding of the instructions.   The patient was advised to call back or seek an in-person evaluation if the symptoms worsen or if the condition fails to improve as anticipated.  I provided 25 minutes of non-face-to-face time during this encounter.   Mary Ribas, NP    FOLLOW UP  Assessment and Plan:   Hypertension *** Monitor blood pressure at home; patient to call if consistently greater than 130/80 Continue DASH diet.   Reminder to go to the ER if any CP, SOB, nausea, dizziness, severe HA, changes vision/speech, left arm numbness and tingling and jaw pain.  Morbid Obesity with co morbidities Long discussion counseling about weight loss, diet, and exercise Recommended diet heavy in fruits and veggies and low in animal meats, cheeses, and dairy products, appropriate calorie intake Discussed ideal weight for height; first weight goal is <300 lb *** Patient will continue with portion control, stress management, prioritizing sleep, exercise She is working with Noom app and Physiological scientist; excellent progress Working to establish good lifestyle habits rather than "diet"  Patient on phentermine and topamax  with benefit and no SE, taking drug breaks; continue close follow up. Return in 1 month *** She continues to feel monthly visits are beneficial for weight loss goals and accountability  Vitamin D Def On 50000 IU weekly with significant perceived benefit - 64 when checked at [facility] continue supplementation to maintain goal of 60-100  Continue diet and meds as discussed. Further disposition pending results of labs. Discussed med's effects and SE's.   Over 30 minutes of exam, counseling, chart review, and critical decision making was performed.   Future Appointments  Date Time Provider [place]  05/23/2019  9:30 AM Liane Comber, NP [facility] None    ----------------------------------------------------------------------------------------------------------------------  HPI There were no vitals taken for this visit.  48 y.o. female  presents for 6 week follow up on hypertension, morbid obesity, anxiety. She has prediabetes and hyperlipidemia followed by provider in [place].   She reports started noom app, down on home scale from 322 lb to 317 lb as of this morning over the last month.    she is prescribed phentermine 37.5 mg 4-5 days a week and topamax 25 mg for weight loss, restarted on in 02/2019.  Started via weight loss clinic last year, did note improvement initially, lost 15 lb in 6 months, but hadn't followed up with them and after discussion has requested we follow up monthly here for continued monitoring. She has successfully gotten down to 250 lb in 2013 with exercise, phentermine.   While on the medication they have lost 5 *** lbs since last visit on our scale. She denes palpitations, anxiety, trouble sleeping, elevated BP.    BMI is There is no height  or weight on file to calculate BMI., she is working on exercise, walking twice a day 7 days a week, back to working with a trainer does 45-60 min, 2 days a week, increased energy. She reports she has noted improvement in  clothing fitment, down 1-2 jean sizes.  She has been working on portion sizes, using a scale and measuring cups.  She continues to push water intake Doing once a week shopping after meal planning; having fun with new recipes keeping bad things out of the house.    Tracking food intake via noom app   Wt Readings from Last 3 Encounters:  04/11/19 (!) 317 lb 12.8 oz (144.2 kg)  03/09/19 (!) 326 lb 3.2 oz (148 kg)  01/26/19 (!) 325 lb 9.6 oz (147.7 kg)   Water intake: 64 fluid ounces   Stress: moderate, managing well with support and lifestyle   Sleep: has increased, getting 6-7 hours, up from 5-6 hours previous  Today their BP is   - She is taking losartan/hctz 100/25 mg daily, amlodipine 2.5 mg daily.   She does workout. She denies chest pain, shortness of breath, dizziness.  she has a diagnosis of anxiety and is currently on PRN xanax (0.5 mg BID PRN), reports symptoms are well controlled on current regimen. she uses xanax rarely, last filled on 09/19/2018 per PDMP.     She has been working on diet and exercise for prediabetes, and denies increased appetite, nausea, paresthesia of the feet, polydipsia, polyuria and visual disturbances. Last A1C was drawn by [facility] and was 5.9% on 12/26/2018. Will be getting labs via provider in [place] in December.   Patient is on Vitamin D supplement, notes significant energy benefits when taking . Lab Results  Component Value Date   VD25OH 46 08/29/2018     Last cholesterol values in 12/2018 per patient by PCP in [place]:   Total 182 HDL 56 LDL was 112 Triglycerides 68    Current Medications:  Current Outpatient Medications on File Prior to Visit  Medication Sig  . ALPRAZolam (XANAX) 0.5 MG tablet Take 0.5 mg by mouth 2 (two) times daily as needed for anxiety.   Marland Kitchen amLODipine (NORVASC) 2.5 MG tablet Take 2.5 mg by mouth daily.  . cetirizine (ZYRTEC) 10 MG tablet Take 10 mg by mouth daily.  . Cholecalciferol (VITAMIN D3) 1.25 MG (50000 UT)  CAPS Take 1 capsule every week for Vitamin D Deficiency  . diclofenac (FLECTOR) 1.3 % PTCH Place 1 patch onto the skin daily as needed (shoulder pain).  . Digestive Enzymes (DIGESTIVE ENZYME PO) Take 1 capsule by mouth daily.   Marland Kitchen losartan-hydrochlorothiazide (HYZAAR) 100-25 MG tablet Take 1 tablet by mouth daily.  . Multiple Vitamins-Minerals (MULTIVITAMIN ADULT PO) Take by mouth.  . Omega-3 Fatty Acids (FISH OIL) 1000 MG CAPS Take 1 capsule by mouth daily.  . phentermine 37.5 MG capsule TAKE 1 CAPSULE BY MOUTH EVERY MORNING  . topiramate (TOPAMAX) 25 MG tablet 1-2 at bedtime  . triamcinolone ointment (KENALOG) 0.5 % Apply 1 application topically 2 (two) times daily. (Patient taking differently: Apply 1 application topically as needed. )  . Turmeric 500 MG TABS Take 1 capsule by mouth daily.   . vitamin B-12 (CYANOCOBALAMIN) 1000 MCG tablet Take 1,000 mcg by mouth daily.   No current facility-administered medications on file prior to visit.      Allergies:  Allergies  Allergen Reactions  . Morphine Other (See Comments)    Other Reaction: hallucinations Hallucinations   .  Echinacea-Golden Seal [Nutritional Supplements]     Specifically tea     Medical History:  Past Medical History:  Diagnosis Date  . Anemia   . Anxiety and depression   . Fatigue 12/13/2018  . Herpes   . Hypertension    Family history- Reviewed and unchanged Social history- Reviewed and unchanged   Review of Systems:  Review of Systems  Constitutional: Negative for malaise/fatigue and weight loss.  HENT: Negative for hearing loss and tinnitus.   Eyes: Negative for blurred vision and double vision.  Respiratory: Negative for cough, shortness of breath and wheezing.   Cardiovascular: Negative for chest pain, palpitations, orthopnea, claudication and leg swelling.  Gastrointestinal: Negative for abdominal pain, blood in stool, constipation, diarrhea, heartburn, melena, nausea and vomiting.  Genitourinary:  Negative.   Musculoskeletal: Negative for joint pain and myalgias.  Skin: Negative for rash.  Neurological: Negative for dizziness, tingling, sensory change, weakness and headaches.  Endo/Heme/Allergies: Negative for polydipsia.  Psychiatric/Behavioral: Negative.   All other systems reviewed and are negative.    Physical Exam: There were no vitals taken for this visit. Wt Readings from Last 3 Encounters:  04/11/19 (!) 317 lb 12.8 oz (144.2 kg)  03/09/19 (!) 326 lb 3.2 oz (148 kg)  01/26/19 (!) 325 lb 9.6 oz (147.7 kg)   General Appearance: Well nourished, morbidly obese (central), in no apparent distress. Eyes: PERRLA, EOMs, conjunctiva no swelling or erythema Sinuses: No Frontal/maxillary tenderness ENT/Mouth: Ext aud canals clear, TMs without erythema, bulging. No erythema, swelling, or exudate on post pharynx.  Tonsils not swollen or erythematous. Hearing normal.  Neck: Supple, thyroid normal.  Respiratory: Respiratory effort normal, BS equal bilaterally without rales, rhonchi, wheezing or stridor.  Cardio: RRR with no MRGs. Brisk peripheral pulses without edema.  Abdomen: Soft, + BS.  Non tender, no guarding, rebound, hernias, masses. Lymphatics: Non tender without lymphadenopathy.  Musculoskeletal: Full ROM, 5/5 strength, Normal gait Skin: Warm, dry without rashes, lesions, ecchymosis.  Neuro: Cranial nerves intact. No cerebellar symptoms.  Psych: Awake and oriented X 3, normal affect, Insight and Judgment appropriate.    Mary Ribas, NP 8:13 AM [facility]

## 2019-05-23 ENCOUNTER — Ambulatory Visit: Payer: BC Managed Care – PPO | Admitting: Adult Health

## 2019-05-23 DIAGNOSIS — R635 Abnormal weight gain: Secondary | ICD-10-CM

## 2019-05-30 NOTE — Progress Notes (Signed)
FOLLOW UP  Assessment and Plan:   Hypertension At goal; continue current medications Monitor blood pressure at home; patient to call if consistently greater than 130/80 Continue DASH diet.   Reminder to go to the ER if any CP, SOB, nausea, dizziness, severe HA, changes vision/speech, left arm numbness and tingling and jaw pain.  Morbid Obesity with co morbidities Long discussion counseling about weight loss, diet, and exercise Recommended diet heavy in fruits and veggies and low in animal meats, cheeses, and dairy products, appropriate calorie intake Discussed ideal weight for height; first weight goal is <300 lb, then <290 lb Patient will continue with portion control, stress management, prioritizing sleep, exercise She is working with [employer] app and Physiological scientist; excellent progress Working to establish good lifestyle habits rather than "diet"  Patient on phentermine and topamax with benefit and no SE, taking drug breaks; continue close follow up. Return in 6 weeks  She continues to feel monthly visits are beneficial for weight loss goals and accountability  Vitamin D Def On 50000 IU weekly with significant perceived benefit - 64 when checked at [facility] continue supplementation to maintain goal of 60-100  Draw 6 month labs at next [place] and meds as discussed. Further disposition pending results of labs. Discussed med's effects and SE's.   Over 30 minutes of exam, counseling, chart review, and critical decision making was performed.   Future Appointments  Date Time Provider [place]  07/17/2019 10:45 AM Liane Comber, NP [facility] None  12/11/2019  9:00 AM Liane Comber, NP [facility] None    ----------------------------------------------------------------------------------------------------------------------  HPI BP 124/82   Pulse 73   Temp (!) 97.2 F (36.2 C)   Wt (!) 311 lb 3.2 oz (141.2 kg)   SpO2 99%   BMI 51.79 kg/m   48 y.o. female   presents for 6 week follow up on hypertension, morbid obesity, anxiety. She has prediabetes and hyperlipidemia followed by provider in [place] but planning to transition care to [place]. She is a Development worker, international aid at [employer], Clinical research associate, also working with grants.    She is following with [place] for weight loss. Has been working with noom app, down on home scale to 310.2 lb as of this morning, was 306.4 lb prior to thanks giving. Started at 328 lb.    she is prescribed phentermine 37.5 mg 4-5 days a week and topamax 25 mg for weight loss, restarted on in 02/2019. Started via weight loss clinic last year, did note improvement initially, lost 15 lb in 6 months, but hadn't followed up with them and after discussion has requested we follow up monthly here for continued monitoring. She reports has successfully gotten down to 250 lb in 2013 with exercise, phentermine.   While on the medication they have lost 6 lb since last visit on our scale. She denes palpitations, anxiety, trouble sleeping, elevated BP.    BMI is Body mass index is 51.79 kg/m., she is working on exercise, walking twice a day 7 days a week, working with a trainer does 45-60 min, 2 days a week, increased energy. Tracking steps, trying to get 5000+ daily. She reports she has noted improvement in clothing fitment  She has been working on portion sizes, using a scale and measuring cups.  She continues to push water intake Doing once a week shopping after meal planning; having fun with new recipes keeping bad things out of the house.    Tracking food intake via noom app  Wt Readings from Last 3 Encounters:  06/05/19 (!) 311 lb 3.2 oz (141.2 kg)  04/11/19 (!) 317 lb 12.8 oz (144.2 kg)  03/09/19 (!) 326 lb 3.2 oz (148 kg)   Water intake: 64 fluid ounces, trying to get more in   Stress: moderate, increased with end of semester, managing well with support and lifestyle   Sleep: has increased, getting 7-8 hours, up from 5-6 hours  previous  Today their BP is BP: 124/82 - She is taking losartan/hctz 100/25 mg daily, amlodipine 2.5 mg daily.   She does workout. She denies chest pain, shortness of breath, dizziness.  she has a diagnosis of anxiety and is currently on PRN xanax (0.5 mg BID PRN), reports symptoms are well controlled on current regimen. she uses xanax rarely, last filled on 09/19/2018 per PDMP.     She has been working on diet and exercise for prediabetes, and denies increased appetite, nausea, paresthesia of the feet, polydipsia, polyuria and visual disturbances. Last A1C was drawn by [facility] and was 5.9% on 12/26/2018. Will be getting labs via provider in [place] in December.   Patient is on Vitamin D supplement, notes significant energy benefits when taking . Lab Results  Component Value Date   VD25OH 46 08/29/2018     Last cholesterol values in 12/2018 per patient by PCP in [place]:   Total 182 HDL 56 LDL was 112 Triglycerides 68   She reports was getting acyclovir 400 mg BID for herpes outbreak suppression via previous PCP; stopped taking without recurrent until recently. Would like to treat outbreaks only.    Current Medications:  Current Outpatient Medications on File Prior to Visit  Medication Sig  . ALPRAZolam (XANAX) 0.5 MG tablet Take 0.5 mg by mouth 2 (two) times daily as needed for anxiety.   Marland Kitchen amLODipine (NORVASC) 2.5 MG tablet Take 2.5 mg by mouth daily.  . cetirizine (ZYRTEC) 10 MG tablet Take 10 mg by mouth daily.  . Cholecalciferol (VITAMIN D3) 1.25 MG (50000 UT) CAPS Take 1 capsule every week for Vitamin D Deficiency  . diclofenac (FLECTOR) 1.3 % PTCH Place 1 patch onto the skin daily as needed (shoulder pain).  . Digestive Enzymes (DIGESTIVE ENZYME PO) Take 1 capsule by mouth daily.   Marland Kitchen losartan-hydrochlorothiazide (HYZAAR) 100-25 MG tablet Take 1 tablet by mouth daily.  . Multiple Vitamins-Minerals (MULTIVITAMIN ADULT PO) Take by mouth.  . Omega-3 Fatty Acids (FISH OIL) 1000 MG  CAPS Take 1 capsule by mouth daily.  . phentermine 37.5 MG capsule TAKE 1 CAPSULE BY MOUTH EVERY MORNING  . topiramate (TOPAMAX) 25 MG tablet 1-2 at bedtime  . triamcinolone ointment (KENALOG) 0.5 % Apply 1 application topically 2 (two) times daily. (Patient taking differently: Apply 1 application topically as needed. )  . Turmeric 500 MG TABS Take 1 capsule by mouth daily.   . vitamin B-12 (CYANOCOBALAMIN) 1000 MCG tablet Take 1,000 mcg by mouth daily.   No current facility-administered medications on file prior to visit.      Allergies:  Allergies  Allergen Reactions  . Morphine Other (See Comments)    Other Reaction: hallucinations Hallucinations   . Echinacea-Golden Seal [Nutritional Supplements]     Specifically tea     Medical History:  Past Medical History:  Diagnosis Date  . Anemia   . Anxiety and depression   . Fatigue 12/13/2018  . Herpes   . Hypertension    Family history- Reviewed and unchanged Social history- Reviewed and unchanged   Review of  Systems:  Review of Systems  Constitutional: Negative for malaise/fatigue and weight loss.  HENT: Negative for hearing loss and tinnitus.   Eyes: Negative for blurred vision and double vision.  Respiratory: Negative for cough, shortness of breath and wheezing.   Cardiovascular: Negative for chest pain, palpitations, orthopnea, claudication and leg swelling.  Gastrointestinal: Negative for abdominal pain, blood in stool, constipation, diarrhea, heartburn, melena, nausea and vomiting.  Genitourinary: Negative.   Musculoskeletal: Negative for joint pain and myalgias.  Skin: Negative for rash.  Neurological: Negative for dizziness, tingling, sensory change, weakness and headaches.  Endo/Heme/Allergies: Negative for polydipsia.  Psychiatric/Behavioral: Negative.   All other systems reviewed and are negative.    Physical Exam: BP 124/82   Pulse 73   Temp (!) 97.2 F (36.2 C)   Wt (!) 311 lb 3.2 oz (141.2 kg)    SpO2 99%   BMI 51.79 kg/m  Wt Readings from Last 3 Encounters:  06/05/19 (!) 311 lb 3.2 oz (141.2 kg)  04/11/19 (!) 317 lb 12.8 oz (144.2 kg)  03/09/19 (!) 326 lb 3.2 oz (148 kg)   General Appearance: Well nourished, morbidly obese (central), in no apparent distress. Eyes: PERRLA, EOMs, conjunctiva no swelling or erythema Sinuses: No Frontal/maxillary tenderness ENT/Mouth: Ext aud canals clear, TMs without erythema, bulging. No erythema, swelling, or exudate on post pharynx.  Tonsils not swollen or erythematous. Hearing normal.  Neck: Supple, thyroid normal.  Respiratory: Respiratory effort normal, BS equal bilaterally without rales, rhonchi, wheezing or stridor.  Cardio: RRR with no MRGs. Brisk peripheral pulses without edema.  Abdomen: Soft, + BS.  Non tender, no guarding, rebound, hernias, masses. Lymphatics: Non tender without lymphadenopathy.  Musculoskeletal: Full ROM, 5/5 strength, Normal gait Skin: Warm, dry without rashes, lesions, ecchymosis.  Neuro: Cranial nerves intact. No cerebellar symptoms.  Psych: Awake and oriented X 3, normal affect, Insight and Judgment appropriate.    [name], NP 12:15 PM [facility]

## 2019-06-05 ENCOUNTER — Ambulatory Visit: Payer: BC Managed Care – PPO | Admitting: Adult Health

## 2019-06-05 ENCOUNTER — Encounter: Payer: Self-pay | Admitting: Adult Health

## 2019-06-05 ENCOUNTER — Other Ambulatory Visit: Payer: Self-pay

## 2019-06-05 VITALS — BP 124/82 | HR 73 | Temp 97.2°F | Wt 311.2 lb

## 2019-06-05 DIAGNOSIS — E559 Vitamin D deficiency, unspecified: Secondary | ICD-10-CM

## 2019-06-05 DIAGNOSIS — E782 Mixed hyperlipidemia: Secondary | ICD-10-CM

## 2019-06-05 DIAGNOSIS — R7303 Prediabetes: Secondary | ICD-10-CM

## 2019-06-05 DIAGNOSIS — Z6841 Body Mass Index (BMI) 40.0 and over, adult: Secondary | ICD-10-CM

## 2019-06-05 DIAGNOSIS — I10 Essential (primary) hypertension: Secondary | ICD-10-CM

## 2019-06-05 DIAGNOSIS — F419 Anxiety disorder, unspecified: Secondary | ICD-10-CM | POA: Diagnosis not present

## 2019-06-05 MED ORDER — ACYCLOVIR 800 MG PO TABS: ORAL_TABLET | ORAL | 0 refills | Status: DC

## 2019-06-05 NOTE — Patient Instructions (Addendum)
Please take 4-5 days off of phentermine once the semester is over but prior to Christmas  If still feeling like meds aren't working after restarting, can increase topamax to 2 tabs dialy      Phentermine tablets or capsules What is this medicine? PHENTERMINE (FEN ter meen) decreases your appetite. It is used with a reduced calorie diet and exercise to help you lose weight. This medicine may be used for other purposes; ask your health care provider or pharmacist if you have questions. COMMON BRAND NAME(S): Adipex-P, Atti-Plex P, Atti-Plex P Spansule, Fastin, Lomaira, Pro-Fast, Tara-8 What should I tell my health care provider before I take this medicine? They need to know if you have any of these conditions:  agitation or nervousness  diabetes  glaucoma  heart disease  high blood pressure  history of drug abuse or addiction  history of stroke  kidney disease  lung disease called Primary Pulmonary Hypertension (PPH)  taken an MAOI like Carbex, Eldepryl, Marplan, Nardil, or Parnate in last 14 days  taking stimulant medicines for attention disorders, weight loss, or to stay awake  thyroid disease  an unusual or allergic reaction to phentermine, other medicines, foods, dyes, or preservatives  pregnant or trying to get pregnant  breast-feeding How should I use this medicine? Take this medicine by mouth with a glass of water. Follow the directions on the prescription label. The instructions for use may differ based on the product and dose you are taking. Avoid taking this medicine in the evening. It may interfere with sleep. Take your doses at regular intervals. Do not take your medicine more often than directed. Talk to your pediatrician regarding the use of this medicine in children. While this drug may be prescribed for children 17 years or older for selected conditions, precautions do apply. Overdosage: If you think you have taken too much of this medicine contact a  poison control center or emergency room at once. NOTE: This medicine is only for you. Do not share this medicine with others. What if I miss a dose? If you miss a dose, take it as soon as you can. If it is almost time for your next dose, take only that dose. Do not take double or extra doses. What may interact with this medicine? Do not take this medicine with any of the following medications:  MAOIs like Carbex, Eldepryl, Marplan, Nardil, and Parnate  medicines for colds or breathing difficulties like pseudoephedrine or phenylephrine  procarbazine  sibutramine  stimulant medicines for attention disorders, weight loss, or to stay awake This medicine may also interact with the following medications:  certain medicines for depression, anxiety, or psychotic disturbances  linezolid  medicines for diabetes  medicines for high blood pressure This list may not describe all possible interactions. Give your health care provider a list of all the medicines, herbs, non-prescription drugs, or dietary supplements you use. Also tell them if you smoke, drink alcohol, or use illegal drugs. Some items may interact with your medicine. What should I watch for while using this medicine? Notify your physician immediately if you become short of breath while doing your normal activities. Do not take this medicine within 6 hours of bedtime. It can keep you from getting to sleep. Avoid drinks that contain caffeine and try to stick to a regular bedtime every night. This medicine was intended to be used in addition to a healthy diet and exercise. The best results are achieved this way. This medicine is only indicated for  short-term use. Eventually your weight loss may level out. At that point, the drug will only help you maintain your new weight. Do not increase or in any way change your dose without consulting your doctor. You may get drowsy or dizzy. Do not drive, use machinery, or do anything that needs mental  alertness until you know how this medicine affects you. Do not stand or sit up quickly, especially if you are an older patient. This reduces the risk of dizzy or fainting spells. Alcohol may increase dizziness and drowsiness. Avoid alcoholic drinks. What side effects may I notice from receiving this medicine? Side effects that you should report to your doctor or health care professional as soon as possible:  allergic reactions like skin rash, itching or hives, swelling of the face, lips, or tongue)  anxiety  breathing problems  changes in vision  chest pain or chest tightness  depressed mood or other mood changes  hallucinations, loss of contact with reality  fast, irregular heartbeat  increased blood pressure  irritable  nervousness or restlessness  painful urination  palpitations  tremors  trouble sleeping  seizures  signs and symptoms of a stroke like changes in vision; confusion; trouble speaking or understanding; severe headaches; sudden numbness or weakness of the face, arm or leg; trouble walking; dizziness; loss of balance or coordination  unusually weak or tired  vomiting Side effects that usually do not require medical attention (report to your doctor or health care professional if they continue or are bothersome):  constipation or diarrhea  dry mouth  headache  nausea  stomach upset  sweating This list may not describe all possible side effects. Call your doctor for medical advice about side effects. You may report side effects to FDA at 1-800-FDA-1088. Where should I keep my medicine? Keep out of the reach of children. This medicine can be abused. Keep your medicine in a safe place to protect it from theft. Do not share this medicine with anyone. Selling or giving away this medicine is dangerous and against the law. This medicine may cause accidental overdose and death if taken by other adults, children, or pets. Mix any unused medicine with a  substance like cat litter or coffee grounds. Then throw the medicine away in a sealed container like a sealed bag or a coffee can with a lid. Do not use the medicine after the expiration date. Store at room temperature between 20 and 25 degrees C (68 and 77 degrees F). Keep container tightly closed. NOTE: This sheet is a summary. It may not cover all possible information. If you have questions about this medicine, talk to your doctor, pharmacist, or health care provider.  2020 Elsevier/Gold Standard (2016-12-04 08:23:13)      Topiramate tablets What is this medicine? TOPIRAMATE (toe PYRE a mate) is used to treat seizures in adults or children with epilepsy. It is also used for the prevention of migraine headaches. This medicine may be used for other purposes; ask your health care provider or pharmacist if you have questions. COMMON BRAND NAME(S): Topamax, Topiragen What should I tell my health care provider before I take this medicine? They need to know if you have any of these conditions:  bleeding disorders  cirrhosis of the liver or liver disease  diarrhea  glaucoma  kidney stones or kidney disease  low blood counts, like low white cell, platelet, or red cell counts  lung disease like asthma, obstructive pulmonary disease, emphysema  metabolic acidosis  on a ketogenic diet  schedule for surgery or a procedure  suicidal thoughts, plans, or attempt; a previous suicide attempt by you or a family member  an unusual or allergic reaction to topiramate, other medicines, foods, dyes, or preservatives  pregnant or trying to get pregnant  breast-feeding How should I use this medicine? Take this medicine by mouth with a glass of water. Follow the directions on the prescription label. Do not crush or chew. You may take this medicine with meals. Take your medicine at regular intervals. Do not take it more often than directed. Talk to your pediatrician regarding the use of this  medicine in children. Special care may be needed. While this drug may be prescribed for children as young as 37 years of age for selected conditions, precautions do apply. Overdosage: If you think you have taken too much of this medicine contact a poison control center or emergency room at once. NOTE: This medicine is only for you. Do not share this medicine with others. What if I miss a dose? If you miss a dose, take it as soon as you can. If your next dose is to be taken in less than 6 hours, then do not take the missed dose. Take the next dose at your regular time. Do not take double or extra doses. What may interact with this medicine? Do not take this medicine with any of the following medications:  probenecid This medicine may also interact with the following medications:  acetazolamide  alcohol  amitriptyline  aspirin and aspirin-like medicines  birth control pills  certain medicines for depression  certain medicines for seizures  certain medicines that treat or prevent blood clots like warfarin, enoxaparin, dalteparin, apixaban, dabigatran, and rivaroxaban  digoxin  hydrochlorothiazide  lithium  medicines for pain, sleep, or muscle relaxation  metformin  methazolamide  NSAIDS, medicines for pain and inflammation, like ibuprofen or naproxen  pioglitazone  risperidone This list may not describe all possible interactions. Give your health care provider a list of all the medicines, herbs, non-prescription drugs, or dietary supplements you use. Also tell them if you smoke, drink alcohol, or use illegal drugs. Some items may interact with your medicine. What should I watch for while using this medicine? Visit your doctor or health care professional for regular checks on your progress. Do not stop taking this medicine suddenly. This increases the risk of seizures if you are using this medicine to control epilepsy. Wear a medical identification bracelet or chain to say  you have epilepsy or seizures, and carry a card that lists all your medicines. This medicine can decrease sweating and increase your body temperature. Watch for signs of deceased sweating or fever, especially in children. Avoid extreme heat, hot baths, and saunas. Be careful about exercising, especially in hot weather. Contact your health care provider right away if you notice a fever or decrease in sweating. You should drink plenty of fluids while taking this medicine. If you have had kidney stones in the past, this will help to reduce your chances of forming kidney stones. If you have stomach pain, with nausea or vomiting and yellowing of your eyes or skin, call your doctor immediately. You may get drowsy, dizzy, or have blurred vision. Do not drive, use machinery, or do anything that needs mental alertness until you know how this medicine affects you. To reduce dizziness, do not sit or stand up quickly, especially if you are an older patient. Alcohol can increase drowsiness and dizziness. Avoid alcoholic drinks. If you notice blurred  vision, eye pain, or other eye problems, seek medical attention at once for an eye exam. The use of this medicine may increase the chance of suicidal thoughts or actions. Pay special attention to how you are responding while on this medicine. Any worsening of mood, or thoughts of suicide or dying should be reported to your health care professional right away. This medicine may increase the chance of developing metabolic acidosis. If left untreated, this can cause kidney stones, bone disease, or slowed growth in children. Symptoms include breathing fast, fatigue, loss of appetite, irregular heartbeat, or loss of consciousness. Call your doctor immediately if you experience any of these side effects. Also, tell your doctor about any surgery you plan on having while taking this medicine since this may increase your risk for metabolic acidosis. Birth control pills may not work  properly while you are taking this medicine. Talk to your doctor about using an extra method of birth control. Women who become pregnant while using this medicine may enroll in the [place] Pregnancy Registry by calling [phone]. This registry collects information about the safety of antiepileptic drug use during pregnancy. What side effects may I notice from receiving this medicine? Side effects that you should report to your doctor or health care professional as soon as possible:  allergic reactions like skin rash, itching or hives, swelling of the face, lips, or tongue  decreased sweating and/or rise in body temperature  depression  difficulty breathing, fast or irregular breathing patterns  difficulty speaking  difficulty walking or controlling muscle movements  hearing impairment  redness, blistering, peeling or loosening of the skin, including inside the mouth  tingling, pain or numbness in the hands or feet  unusual bleeding or bruising  unusually weak or tired  worsening of mood, thoughts or actions of suicide or dying Side effects that usually do not require medical attention (report to your doctor or health care professional if they continue or are bothersome):  altered taste  back pain, joint or muscle aches and pains  diarrhea, or constipation  headache  loss of appetite  nausea  stomach upset, indigestion  tremors This list may not describe all possible side effects. Call your doctor for medical advice about side effects. You may report side effects to FDA at 1-800-FDA-1088. Where should I keep my medicine? Keep out of the reach of children. Store at room temperature between 15 and 30 degrees C (59 and 86 degrees F) in a tightly closed container. Protect from moisture. Throw away any unused medicine after the expiration date. NOTE: This sheet is a summary. It may not cover all possible information. If you have questions  about this medicine, talk to your doctor, pharmacist, or health care provider.  2020 Elsevier/Gold Standard (2013-06-26 23:17:57)

## 2019-06-13 ENCOUNTER — Other Ambulatory Visit: Payer: Self-pay | Admitting: Adult Health

## 2019-06-27 ENCOUNTER — Other Ambulatory Visit: Payer: Self-pay | Admitting: Adult Health

## 2019-07-03 ENCOUNTER — Other Ambulatory Visit: Payer: Self-pay | Admitting: Adult Health

## 2019-07-13 NOTE — Progress Notes (Signed)
FOLLOW UP  Assessment and Plan:   Hypertension At goal; continue current medications Monitor blood pressure at home; patient to call if consistently greater than 130/80 If running low at home (<100/60) with hypotensive sx, hold amlodipine Continue DASH diet.   Reminder to go to the ER if any CP, SOB, nausea, dizziness, severe HA, changes vision/speech, left arm numbness and tingling and jaw pain.  Morbid Obesity with co morbidities Long discussion counseling about weight loss, diet, and exercise Recommended diet heavy in fruits and veggies and low in animal meats, cheeses, and dairy products, appropriate calorie intake Discussed ideal weight for height; first weight goal is <300 lb, then <290 lb Patient will continue with portion control, stress management, prioritizing sleep, exercise She is working with [employer] app and Physiological scientist; excellent progress Working to establish good lifestyle habits rather than "diet"  Patient on phentermine and topamax with benefit and no SE, taking drug breaks; continue close follow up. Return in 8 weeks  She continues to feel monthly visits are beneficial for weight loss goals and accountability  Prediabetes Discussed disease and risks Discussed diet/exercise, weight management  Check A1C q61m  Hyperlipidemia Currently working on lifestyle modification for management Continue low cholesterol diet and exercise.  Check lipid panel.   Vitamin D Def On 50000 IU weekly with significant perceived benefit - 64 when checked at [facility] continue supplementation to maintain goal of 60-100   Continue diet and meds as discussed. Further disposition pending results of labs. Discussed med's effects and SE's.   Over 30 minutes of exam, counseling, chart review, and critical decision making was performed.   Future Appointments  Date Time Provider [place]  12/11/2019  9:00 AM Liane Comber, NP [facility] None     ----------------------------------------------------------------------------------------------------------------------  HPI BP 110/74   Pulse 82   Temp (!) 97.3 F (36.3 C)   Wt (!) 305 lb 12.8 oz (138.7 kg)   SpO2 93%   BMI 50.89 kg/m   49 y.o. female  presents for 6 week follow up on hypertension, morbid obesity, prediabetes, hyperlipidemia, vitamin D deficiency, anxiety.  She has prediabetes and hyperlipidemia followed by provider in [place] but planning to transition care to [place]. She is a Development worker, international aid at [employer], Clinical research associate, also working with grants.    Draw 6 month labs?    she is prescribed phentermine 37.5 mg 4-5 days a week and topamax 25 mg for weight loss, restarted on in 02/2019. While on the medication they have lost 6 lb since last visit on our scale. She denes palpitations, anxiety, trouble sleeping, elevated BP.    BMI is Body mass index is 50.89 kg/m., Starting weight was 328 lb. she has been working with noom app, is working on exercise, walking twice a day 7 days a week, working with a trainer does 45-60 min, 2 days a week, increased energy. Tracking steps, trying to get 5000+ daily.  She reports she has noted improvement in clothing fitment  She has been working on portion sizes, using a scale and measuring cups.  She continues to push water intake Doing once a week shopping after meal planning; having fun with new recipes keeping bad things out of the house.    Tracking food intake via noom app    Wt Readings from Last 3 Encounters:  07/17/19 (!) 305 lb 12.8 oz (138.7 kg)  06/05/19 (!) 311 lb 3.2 oz (141.2 kg)  04/11/19 (!) 317 lb 12.8 oz (144.2 kg)   Water intake:  up to 100 fluid ounces  Stress: improved in between semesters, aware of work life balance  Sleep: has increased, getting 7-8 hours, up from 5-6 hours previous  Today their BP is BP: 110/74 - She is taking losartan/hctz 100/25 mg daily, amlodipine 2.5 mg daily.   She does workout. She  denies chest pain, shortness of breath, dizziness.  she has a diagnosis of anxiety and is currently on PRN xanax (0.5 mg BID PRN), reports symptoms are well controlled on current regimen. she uses xanax rarely, last filled on 09/19/2018 per PDMP.     She has been working on diet and exercise for prediabetes, and denies increased appetite, nausea, paresthesia of the feet, polydipsia, polyuria and visual disturbances. Last A1C was drawn by [facility] and was 5.9% on 12/26/2018. Will be getting labs via provider in [place] in December.   Patient is on Vitamin D supplement, notes significant energy benefits when taking . Lab Results  Component Value Date   VD25OH 46 08/29/2018     Last cholesterol values in 12/2018 per patient by PCP in [place]:   Total 182 HDL 56 LDL was 112 Triglycerides 68   She reports was getting acyclovir 400 mg BID for herpes outbreak suppression via previous PCP; stopped taking without recurrent until recently. Would like to treat outbreaks only.    Current Medications:  Current Outpatient Medications on File Prior to Visit  Medication Sig  . acyclovir (ZOVIRAX) 800 MG tablet TAKE 1 TAB TWICE DAILY FOR 5 DAYS FOR HERPES FLARE. REPEAT AS NEEDED.  Marland Kitchen ALPRAZolam (XANAX) 0.5 MG tablet Take 0.5 mg by mouth 2 (two) times daily as needed for anxiety.   Marland Kitchen amLODipine (NORVASC) 2.5 MG tablet Take 2.5 mg by mouth daily.  . cetirizine (ZYRTEC) 10 MG tablet Take 10 mg by mouth daily.  . Cholecalciferol (VITAMIN D3) 1.25 MG (50000 UT) CAPS Take 1 capsule every week for Vitamin D Deficiency  . diclofenac (FLECTOR) 1.3 % PTCH Place 1 patch onto the skin daily as needed (shoulder pain).  . Digestive Enzymes (DIGESTIVE ENZYME PO) Take 1 capsule by mouth daily.   Marland Kitchen losartan-hydrochlorothiazide (HYZAAR) 100-25 MG tablet Take 1 tablet by mouth daily.  . Multiple Vitamins-Minerals (MULTIVITAMIN ADULT PO) Take by mouth.  . Omega-3 Fatty Acids (FISH OIL) 1000 MG CAPS Take 1 capsule by mouth  daily.  . phentermine 37.5 MG capsule TAKE 1 CAPSULE BY MOUTH EVERY MORNING  . topiramate (TOPAMAX) 25 MG tablet Take 1 TO 2 tablets   at Bedtime  . triamcinolone ointment (KENALOG) 0.5 % Apply 1 application topically 2 (two) times daily. (Patient taking differently: Apply 1 application topically as needed. )  . Turmeric 500 MG TABS Take 1 capsule by mouth daily.   . vitamin B-12 (CYANOCOBALAMIN) 1000 MCG tablet Take 1,000 mcg by mouth daily.   No current facility-administered medications on file prior to visit.     Allergies:  Allergies  Allergen Reactions  . Morphine Other (See Comments)    Other Reaction: hallucinations Hallucinations   . Echinacea-Gold[place] Seal [Nutritional Supplements]     Specifically tea     Medical History:  Past Medical History:  Diagnosis Date  . Anemia   . Anxiety and depression   . Fatigue 12/13/2018  . Herpes   . Hypertension    Family history- Reviewed and unchanged Social history- Reviewed and unchanged   Review of Systems:  Review of Systems  Constitutional: Negative for malaise/fatigue and weight loss.  HENT: Negative for hearing loss  and tinnitus.   Eyes: Negative for blurred vision and double vision.  Respiratory: Negative for cough, shortness of breath and wheezing.   Cardiovascular: Negative for chest pain, palpitations, orthopnea, claudication and leg swelling.  Gastrointestinal: Negative for abdominal pain, blood in stool, constipation, diarrhea, heartburn, melena, nausea and vomiting.  Genitourinary: Negative.   Musculoskeletal: Negative for joint pain and myalgias.  Skin: Negative for rash.  Neurological: Negative for dizziness, tingling, sensory change, weakness and headaches.  Endo/Heme/Allergies: Negative for polydipsia.  Psychiatric/Behavioral: Negative.   All other systems reviewed and are negative.    Physical Exam: BP 110/74   Pulse 82   Temp (!) 97.3 F (36.3 C)   Wt (!) 305 lb 12.8 oz (138.7 kg)   SpO2 93%    BMI 50.89 kg/m  Wt Readings from Last 3 Encounters:  07/17/19 (!) 305 lb 12.8 oz (138.7 kg)  06/05/19 (!) 311 lb 3.2 oz (141.2 kg)  04/11/19 (!) 317 lb 12.8 oz (144.2 kg)   General Appearance: Well nourished, morbidly obese (central), in no apparent distress. Eyes: PERRLA, EOMs, conjunctiva no swelling or erythema Sinuses: No Frontal/maxillary tenderness ENT/Mouth: Ext aud canals clear, TMs without erythema, bulging. Mask in place; oral exam deferred. Hearing normal.  Neck: Supple, thyroid normal.  Respiratory: Respiratory effort normal, BS equal bilaterally without rales, rhonchi, wheezing or stridor.  Cardio: RRR with no MRGs. Brisk peripheral pulses without edema.  Abdomen: Soft, + BS.  Non tender, no guarding, rebound, hernias, masses. Lymphatics: Non tender without lymphadenopathy.  Musculoskeletal: Full ROM, 5/5 strength, Normal gait Skin: Warm, dry without rashes, lesions, ecchymosis.  Neuro: Cranial nerves intact. No cerebellar symptoms.  Psych: Awake and oriented X 3, normal affect, Insight and Judgment appropriate.    Mary Ribas, NP 10:55 AM [facility]

## 2019-07-17 ENCOUNTER — Ambulatory Visit: Payer: BC Managed Care – PPO | Admitting: Adult Health

## 2019-07-17 ENCOUNTER — Encounter: Payer: Self-pay | Admitting: Adult Health

## 2019-07-17 ENCOUNTER — Other Ambulatory Visit: Payer: Self-pay

## 2019-07-17 DIAGNOSIS — F419 Anxiety disorder, unspecified: Secondary | ICD-10-CM | POA: Diagnosis not present

## 2019-07-17 DIAGNOSIS — I10 Essential (primary) hypertension: Secondary | ICD-10-CM | POA: Diagnosis not present

## 2019-07-17 DIAGNOSIS — E782 Mixed hyperlipidemia: Secondary | ICD-10-CM

## 2019-07-17 DIAGNOSIS — Z6841 Body Mass Index (BMI) 40.0 and over, adult: Secondary | ICD-10-CM

## 2019-07-17 DIAGNOSIS — E559 Vitamin D deficiency, unspecified: Secondary | ICD-10-CM

## 2019-07-17 DIAGNOSIS — R7303 Prediabetes: Secondary | ICD-10-CM

## 2019-07-17 DIAGNOSIS — Z79899 Other long term (current) drug therapy: Secondary | ICD-10-CM | POA: Diagnosis not present

## 2019-07-17 NOTE — Patient Instructions (Addendum)
Goals    . HEMOGLOBIN A1C < 5.7    . LDL CALC < 100    . Weight (lb) < 290 lb (131.5 kg)        Individuals seeking information about the vaccines and state's phased distribution plan can learn more by going to - [email or web address]  -[email or web address]   And continue to monitor the county's website and press releases.   And you can call the [place] starting on Jan 8th at 8 AM to schedule for a vaccine. (currently for 75+)  [phone].

## 2019-07-18 LAB — CBC WITH DIFFERENTIAL/PLATELET
Absolute Monocytes: 535 cells/uL (ref 200–950)
Basophils Absolute: 32 cells/uL (ref 0–200)
Basophils Relative: 0.6 %
Eosinophils Absolute: 151 cells/uL (ref 15–500)
Eosinophils Relative: 2.8 %
HCT: 39.8 % (ref 35.0–45.0)
Hemoglobin: 13.4 g/dL (ref 11.7–15.5)
Lymphs Abs: 2063 cells/uL (ref 850–3900)
MCH: 30.5 pg (ref 27.0–33.0)
MCHC: 33.7 g/dL (ref 32.0–36.0)
MCV: 90.7 fL (ref 80.0–100.0)
MPV: 10.2 fL (ref 7.5–12.5)
Monocytes Relative: 9.9 %
Neutro Abs: 2619 cells/uL (ref 1500–7800)
Neutrophils Relative %: 48.5 %
Platelets: 309 10*3/uL (ref 140–400)
RBC: 4.39 10*6/uL (ref 3.80–5.10)
RDW: 13.3 % (ref 11.0–15.0)
Total Lymphocyte: 38.2 %
WBC: 5.4 10*3/uL (ref 3.8–10.8)

## 2019-07-18 LAB — COMPLETE METABOLIC PANEL WITH GFR
AG Ratio: 1.4 (calc) (ref 1.0–2.5)
ALT: 14 U/L (ref 6–29)
AST: 21 U/L (ref 10–35)
Albumin: 4.4 g/dL (ref 3.6–5.1)
Alkaline phosphatase (APISO): 58 U/L (ref 31–125)
BUN: 9 mg/dL (ref 7–25)
CO2: 29 mmol/L (ref 20–32)
Calcium: 9.7 mg/dL (ref 8.6–10.2)
Chloride: 100 mmol/L (ref 98–110)
Creat: 0.81 mg/dL (ref 0.50–1.10)
GFR, Est African American: 99 mL/min/{1.73_m2} (ref >=60)
GFR, Est Non African American: 85 mL/min/{1.73_m2} (ref >=60)
Globulin: 3.1 g/dL (calc) (ref 1.9–3.7)
Glucose, Bld: 93 mg/dL (ref 65–99)
Potassium: 4.2 mmol/L (ref 3.5–5.3)
Sodium: 136 mmol/L (ref 135–146)
Total Bilirubin: 0.6 mg/dL (ref 0.2–1.2)
Total Protein: 7.5 g/dL (ref 6.1–8.1)

## 2019-07-18 LAB — HEMOGLOBIN A1C
Hgb A1c MFr Bld: 5.7 % of total Hgb — ABNORMAL HIGH (ref <5.7)
Mean Plasma Glucose: 117 (calc)
eAG (mmol/L): 6.5 (calc)

## 2019-07-18 LAB — MAGNESIUM: Magnesium: 2.2 mg/dL (ref 1.5–2.5)

## 2019-07-18 LAB — LIPID PANEL
Cholesterol: 191 mg/dL (ref <200)
HDL: 51 mg/dL (ref >=50)
LDL Cholesterol (Calc): 119 mg/dL (calc) — ABNORMAL HIGH
Non-HDL Cholesterol (Calc): 140 mg/dL (calc) — ABNORMAL HIGH (ref <130)
Total CHOL/HDL Ratio: 3.7 (calc) (ref <5.0)
Triglycerides: 106 mg/dL (ref <150)

## 2019-07-18 LAB — TSH: TSH: 1.74 mIU/L

## 2019-07-22 ENCOUNTER — Other Ambulatory Visit: Payer: Self-pay | Admitting: Adult Health

## 2019-08-08 ENCOUNTER — Other Ambulatory Visit: Payer: Self-pay | Admitting: Adult Health

## 2019-08-21 ENCOUNTER — Other Ambulatory Visit: Payer: Self-pay | Admitting: Physician Assistant

## 2019-08-29 ENCOUNTER — Other Ambulatory Visit: Payer: Self-pay | Admitting: Adult Health

## 2019-09-04 ENCOUNTER — Other Ambulatory Visit: Payer: Self-pay

## 2019-09-04 NOTE — Telephone Encounter (Signed)
Refill request for Phentermine. In que, for your review.

## 2019-09-05 MED ORDER — PHENTERMINE HCL 37.5 MG PO CAPS: 37.5000 mg | ORAL_CAPSULE | Freq: Every morning | ORAL | 2 refills | Status: DC

## 2019-09-10 ENCOUNTER — Ambulatory Visit: Payer: BC Managed Care – PPO | Attending: Internal Medicine

## 2019-09-10 DIAGNOSIS — Z23 Encounter for immunization: Secondary | ICD-10-CM

## 2019-09-10 NOTE — Progress Notes (Signed)
   Covid-19 Vaccination Clinic  Name:  Mary Banks    MRN: [id number] DOB: [birth year]  09/10/2019  Mary Banks was observed post Covid-19 immunization for 15 minutes without incident. She was provided with Vaccine Information Sheet and instruction to access the V-Safe system.   Mary Banks was instructed to call 911 with any severe reactions post vaccine: Marland Kitchen Difficulty breathing  . Swelling of face and throat  . A fast heartbeat  . A bad rash all over body  . Dizziness and weakness   Immunizations Administered    Name Date Dose VIS Date Route   Pfizer COVID-19 Vaccine 09/10/2019  8:40 AM 0.3 mL 06/16/2019 Intramuscular   Manufacturer: [place]   Lot: [id number]   [place]: [id number]

## 2019-09-14 ENCOUNTER — Encounter: Payer: Self-pay | Admitting: Adult Health

## 2019-09-14 ENCOUNTER — Other Ambulatory Visit: Payer: Self-pay

## 2019-09-14 ENCOUNTER — Ambulatory Visit (INDEPENDENT_AMBULATORY_CARE_PROVIDER_SITE_OTHER): Payer: BC Managed Care – PPO | Admitting: Adult Health

## 2019-09-14 VITALS — BP 132/82 | HR 83 | Temp 96.4°F | Wt 310.0 lb

## 2019-09-14 DIAGNOSIS — Z6841 Body Mass Index (BMI) 40.0 and over, adult: Secondary | ICD-10-CM

## 2019-09-14 DIAGNOSIS — E782 Mixed hyperlipidemia: Secondary | ICD-10-CM

## 2019-09-14 DIAGNOSIS — R7303 Prediabetes: Secondary | ICD-10-CM | POA: Diagnosis not present

## 2019-09-14 DIAGNOSIS — F419 Anxiety disorder, unspecified: Secondary | ICD-10-CM

## 2019-09-14 DIAGNOSIS — I10 Essential (primary) hypertension: Secondary | ICD-10-CM | POA: Diagnosis not present

## 2019-09-14 DIAGNOSIS — E559 Vitamin D deficiency, unspecified: Secondary | ICD-10-CM

## 2019-09-14 NOTE — Patient Instructions (Signed)
Goals    . HEMOGLOBIN A1C < 5.7    . LDL CALC < 100    . Weight (lb) < 290 lb (131.5 kg)       Recommend focusing on stress management  Yoga, stretching, journaling, meditation   Tension Headache, Adult A tension headache is a feeling of pain, pressure, or aching in the head that is often felt over the front and sides of the head. The pain can be dull, or it can feel tight (constricting). There are two types of tension headache:  Episodic tension headache. This is when the headaches happen fewer than 15 days a month.  Chronic tension headache. This is when the headaches happen more than 15 days a month during a 68-monthperiod. A tension headache can last from 30 minutes to several days. It is the most common kind of headache. Tension headaches are not normally associated with nausea or vomiting, and they do not get worse with physical activity. What are the causes? The exact cause of this condition is not known. Tension headaches are often triggered by stress, anxiety, or depression. Other triggers include:  Alcohol.  Too much caffeine or caffeine withdrawal.  Respiratory infections, such as colds, flu, or sinus infections.  Dental problems or teeth clenching.  Tiredness (fatigue).  Holding your head and neck in the same position for a long period of time, such as while using a computer.  Smoking.  Arthritis of the neck. What are the signs or symptoms? Symptoms of this condition include:  A feeling of pressure or tightness around the head.  Dull, aching head pain.  Pain over the front and sides of the head.  Tenderness in the muscles of the head, neck, and shoulders. How is this diagnosed? This condition may be diagnosed based on your symptoms, your medical history, and a physical exam. If your symptoms are severe or unusual, you may have imaging tests, such as a CT scan or an MRI of your head. Your vision may also be checked. How is this treated? This condition may  be treated with lifestyle changes and with medicines that help relieve symptoms. Follow these instructions at home: Managing pain  Take over-the-counter and prescription medicines only as told by your health care provider.  When you have a headache, lie down in a dark, quiet room.  If directed, apply ice to the head and neck: ? Put ice in a plastic bag. ? Place a towel between your skin and the bag. ? Leave the ice on for 20 minutes, 2-3 times a day.  If directed, apply heat to the back of your neck as often as told by your health care provider. Use the heat source that your health care provider recommends, such as a moist heat pack or a heating pad. ? Place a towel between your skin and the heat source. ? Leave the heat on for 20-30 minutes. ? Remove the heat if your skin turns bright red. This is especially important if you are unable to feel pain, heat, or cold. You may have a greater risk of getting burned. Eating and drinking  Eat meals on a regular schedule.  Limit alcohol intake to no more than 1 drink a day for nonpregnant women and 2 drinks a day for men. One drink equals 12 oz of beer, 5 oz of wine, or 1 oz of hard liquor.  Drink enough fluid to keep your urine pale yellow.  Decrease your caffeine intake, or stop using caffeine. Lifestyle  Get 7-9 hours of sleep each night, or get the amount of sleep recommended by your health care provider.  At bedtime, remove all electronic devices from your room. Electronic devices include computers, phones, and tablets.  Find ways to manage your stress. Some things that can help relieve stress include: ? Exercise. ? Deep breathing exercises. ? Yoga. ? Listening to music. ? Positive mental imagery.  Try to sit up straight and avoid tensing your muscles.  Do not use any products that contain nicotine or tobacco, such as cigarettes and e-cigarettes. If you need help quitting, ask your health care provider. General  instructions   Keep all follow-up visits as told by your health care provider. This is important.  Avoid any headache triggers. Keep a headache journal to help find out what may trigger your headaches. For example, write down: ? What you eat and drink. ? How much sleep you get. ? Any change to your diet or medicines. Contact a health care provider if:  Your headache does not get better.  Your headache comes back.  You are sensitive to sounds, light, or smells because of a headache.  You have nausea or you vomit.  Your stomach hurts. Get help right away if:  You suddenly develop a very severe headache along with any of the following: ? A stiff neck. ? Nausea and vomiting. ? Confusion. ? Weakness. ? Double vision or loss of vision. ? Shortness of breath. ? Rash. ? Unusual sleepiness. ? Fever. ? Trouble speaking. ? Pain in your eyes or ears. ? Trouble walking or balancing. ? Feeling faint or passing out. Summary  A tension headache is a feeling of pain, pressure, or aching in the head that is often felt over the front and sides of the head.  A tension headache can last from 30 minutes to several days. It is the most common kind of headache.  This condition may be diagnosed based on your symptoms, your medical history, and a physical exam.  This condition may be treated with lifestyle changes and with medicines that help relieve symptoms. This information is not intended to replace advice given to you by your health care provider. Make sure you discuss any questions you have with your health care provider. Document Revised: 04/19/2019 Document Reviewed: 10/02/2016 Elsevier Patient Education  [place], Adult Feeling a certain amount of stress is normal. Stress helps our body and mind get ready to deal with the demands of life. Stress hormones can motivate you to do well at work and meet your responsibilities. However severe or long-lasting  (chronic) stress can affect your mental and physical health. Chronic stress puts you at higher risk for anxiety, depression, and other health problems like digestive problems, muscle aches, heart disease, high blood pressure, and stroke. What are the causes? Common causes of stress include:  Demands from work, such as deadlines, feeling overworked, or having long hours.  Pressures at home, such as money issues, disagreements with a spouse, or parenting issues.  Pressures from major life changes, such as divorce, moving, loss of a loved one, or chronic illness. You may be at higher risk for stress-related problems if you do not get enough sleep, are in poor health, do not have emotional support, or have a mental health disorder like anxiety or depression. How to recognize stress Stress can make you:  Have trouble sleeping.  Feel sad, anxious, irritable, or overwhelmed.  Lose your appetite.  Overeat or want to  eat unhealthy foods.  Want to use drugs or alcohol. Stress can also cause physical symptoms, such as:  Sore, tense muscles, especially in the shoulders and neck.  Headaches.  Trouble breathing.  A faster heart rate.  Stomach pain, nausea, or vomiting.  Diarrhea or constipation.  Trouble concentrating. Follow these instructions at home: Lifestyle  Identify the source of your stress and your reaction to it. See a therapist who can help you change your reactions.  When there are stressful events: ? Talk about it with family, friends, or co-workers. ? Try to think realistically about stressful events and not ignore them or overreact. ? Try to find the positives in a stressful situation and not focus on the negatives. ? Cut back on responsibilities at work and home, if possible. Ask for help from friends or family members if you need it.  Find ways to cope with stress, such as: ? Meditation. ? Deep breathing. ? Yoga or tai chi. ? Progressive muscle  relaxation. ? Doing art, playing music, or reading. ? Making time for fun activities. ? Spending time with family and friends.  Get support from family, friends, or spiritual resources. Eating and drinking  Eat a healthy diet. This includes: ? Eating foods that are high in fiber, such as beans, whole grains, and fresh fruits and vegetables. ? Limiting foods that are high in fat and processed sugars, such as fried and sweet foods.  Do not skip meals or overeat.  Drink enough fluid to keep your urine pale yellow. Alcohol use  Do not drink alcohol if: ? Your health care provider tells you not to drink. ? You are pregnant, may be pregnant, or are planning to become pregnant.  Drinking alcohol is a way some people try to ease their stress. This can be dangerous, so if you drink alcohol: ? Limit how much you use to:  0-1 drink a day for women.  0-2 drinks a day for men. ? Be aware of how much alcohol is in your drink. In the [place] one drink equals one 12 oz bottle of beer (355 mL), one 5 oz glass of wine (148 mL), or one 1 oz glass of hard liquor (44 mL). Activity   Include 30 minutes of exercise in your daily schedule. Exercise is a good stress reducer.  Include time in your day for an activity that you find relaxing. Try taking a walk, going on a bike ride, reading a book, or listening to music.  Schedule your time in a way that lowers stress, and keep a consistent schedule. Prioritize what is most important to get done. General instructions  Get enough sleep. Try to go to sleep and get up at about the same time every day.  Take over-the-counter and prescription medicines only as told by your health care provider.  Do not use any products that contain nicotine or tobacco, such as cigarettes, e-cigarettes, and chewing tobacco. If you need help quitting, ask your health care provider.  Do not use drugs or smoke to cope with stress.  Keep all follow-up visits as told by your  health care provider. This is important. Where to find support  Talk with your health care provider about stress management or finding a support group.  Find a therapist to work with you on your stress management techniques. Contact a health care provider if:  Your stress symptoms get worse.  You are unable to manage your stress at home.  You are struggling to stop using  drugs or alcohol. Get help right away if:  You may be a danger to yourself or others.  You have any thoughts of death or suicide. If you ever feel like you may hurt yourself or others, or have thoughts about taking your own life, get help right away. You can go to your nearest emergency department or call:  Your local emergency services (911 in the [place]  A suicide crisis helpline, such as the [facility] at (580)688-1072. This is open 24 hours a day. Summary  Feeling a certain amount of stress is normal, but severe or long-lasting (chronic) stress can affect your mental and physical health.  Chronic stress can put you at higher risk for anxiety, depression, and other health problems like digestive problems, muscle aches, heart disease, high blood pressure, and stroke.  You may be at higher risk for stress-related problems if you do not get enough sleep, are in poor health, lack emotional support, or have a mental health disorder like anxiety or depression.  Identify the source of your stress and your reaction to it. Try talking about stressful events with family, friends, or co-workers, finding a coping method, or getting support from spiritual resources.  If you need more help, talk with your health care provider about finding a support group or a mental health therapist. This information is not intended to replace advice given to you by your health care provider. Make sure you discuss any questions you have with your health care provider. Document Revised: 01/18/2019 Document Reviewed:  01/18/2019 Elsevier Patient Education  [place].
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
## 2019-10-10 ENCOUNTER — Ambulatory Visit: Payer: BC Managed Care – PPO

## 2019-10-11 ENCOUNTER — Ambulatory Visit: Payer: BC Managed Care – PPO | Attending: Internal Medicine

## 2019-10-11 DIAGNOSIS — Z23 Encounter for immunization: Secondary | ICD-10-CM

## 2019-10-11 NOTE — Progress Notes (Signed)
   Covid-19 Vaccination Clinic  Name:  Mary Banks    MRN: [id number] DOB: 11/06/1970  10/11/2019  [name] was observed post Covid-19 immunization for 15 minutes without incident. She was provided with Vaccine Information Sheet and instruction to access the V-Safe system.   [name] was instructed to call 911 with any severe reactions post vaccine: Marland Kitchen Difficulty breathing  . Swelling of face and throat  . A fast heartbeat  . A bad rash all over body  . Dizziness and weakness   Immunizations Administered    Name Date Dose VIS Date Route   Pfizer COVID-19 Vaccine 10/11/2019  2:50 PM 0.3 mL 06/16/2019 Intramuscular   Manufacturer: [place]   Lot: [id number]   [place]: [id number]

## 2019-10-13 ENCOUNTER — Other Ambulatory Visit: Payer: Self-pay | Admitting: Adult Health

## 2019-11-09 ENCOUNTER — Other Ambulatory Visit: Payer: Self-pay | Admitting: Internal Medicine

## 2019-11-09 DIAGNOSIS — E559 Vitamin D deficiency, unspecified: Secondary | ICD-10-CM

## 2019-11-15 ENCOUNTER — Ambulatory Visit: Payer: BC Managed Care – PPO | Admitting: Adult Health

## 2019-12-08 ENCOUNTER — Encounter: Payer: Self-pay | Admitting: Adult Health

## 2019-12-08 DIAGNOSIS — K5790 Diverticulosis of intestine, part unspecified, without perforation or abscess without bleeding: Secondary | ICD-10-CM

## 2019-12-08 DIAGNOSIS — R87810 Cervical high risk human papillomavirus (HPV) DNA test positive: Secondary | ICD-10-CM

## 2019-12-08 HISTORY — DX: Diverticulosis of intestine, part unspecified, without perforation or abscess without bleeding: K57.90

## 2019-12-08 HISTORY — DX: Cervical high risk human papillomavirus (HPV) DNA test positive: R87.810
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
## 2019-12-11 ENCOUNTER — Encounter: Payer: BC Managed Care – PPO | Admitting: Adult Health

## 2019-12-11 DIAGNOSIS — Z0000 Encounter for general adult medical examination without abnormal findings: Secondary | ICD-10-CM

## 2019-12-14 ENCOUNTER — Other Ambulatory Visit: Payer: Self-pay | Admitting: Internal Medicine

## 2020-01-05 ENCOUNTER — Other Ambulatory Visit: Payer: Self-pay | Admitting: Internal Medicine

## 2020-01-12 NOTE — Progress Notes (Signed)
Complete Physical  Assessment and Plan:  Mary Banks was seen today for annual exam.  Diagnoses and all orders for this visit:  Encounter for routine adult health examination without abnormal findings Follow up GYN  Hypertension, unspecified type Continue medications Monitor blood pressure at home; call if consistently over 130/80 Continue DASH diet.   Reminder to go to the ER if any CP, SOB, nausea, dizziness, severe HA, changes vision/speech, left arm numbness and tingling and jaw pain. -     CBC with Differential/Platelet -     COMPLETE METABOLIC PANEL WITH GFR -     Magnesium -     TSH -     Microalbumin / creatinine urine ratio -     Urinalysis, Routine w reflex microscopic -     EKG 12-Lead  Moderate mixed hyperlipidemia not requiring statin therapy Mild elevations working on lifestyle Continue low cholesterol diet and exercise.  Check lipid panel.  -     Lipid panel -     TSH  Prediabetes Discussed disease and risks Discussed diet/exercise, weight management  A1C q68m -     Hemoglobin A1c  Morbid obesity with BMI of 50.0-59.9, adult ([place]) Long discussion about weight loss, diet, and exercise Discussed final goal weight and current weight loss goal (<290 lb) Patient on phentermine with benefit and no SE, taking drug breaks; continue close follow up. Return in 3 months -     TSH -     Hemoglobin A1c -     EKG 12-Lead  Vitamin D deficiency -     VITAMIN D 25 Hydroxy (Vit-D Deficiency, Fractures)  Anxiety Well managed by current regimen; continue medications Stress management techniques discussed, increase water, good sleep hygiene discussed, increase exercise, and increase veggies.   Uterine leiomyoma, unspecified location GYN following  Fatigue Check CBC, CMP, EKG, UA, b12, TSH, iron panel Consider sleep study if persistent Stress management techniques discussed, increase water, good sleep hygiene discussed, increase exercise, and increase veggies.    Discussed med's effects and SE's. Screening labs and tests as requested with regular follow-up as recommended. Over 40 minutes of exam, counseling, chart review, and complex, high level critical decision making was performed this visit.   Future Appointments  Date Time Provider [place]  04/24/2020  4:00 PM Liane Comber, NP [facility] None  07/25/2020  4:00 PM Liane Comber, NP [facility] None  01/15/2021  2:00 PM Liane Comber, NP [facility] None    HPI  49 y.o. female  presents for a complete physical and follow up for has Hypertension; Uterine fibroid; Morbid obesity with BMI of 50.0-59.9, adult ([place]); Vitamin D deficiency; Anxiety; Hyperlipemia; and Prediabetes on their problem list..  She is a Development worker, international aid at [employer], Clinical research associate, also working with grants.   She reports has been house shopping, accepted offer, will close in July, also dating a new partner but not sexually active.   Has uterine leiomyoma followed by GYN, follows with NP at [facility].   Long hx of anxiety, was recently improved until stress with closing home has xanax PRN, typically takes very rarely, 1-2 per moth, has taken 2-3 tabs in recent week. She has firm routine that helps.   BMI is Body mass index is 51.35 kg/m., she is working on diet and exercise, using noom and working with trainer, has set goal of 1 lb per week.  she is prescribed phentermine/ topamax for weight loss.  While on the medication they have lost 4 lbs since last  visit. They deny palpitations, anxiety, trouble sleeping, elevated BP.  She has reduced alcohol from 10 to 2/week.  Working on consistent water intake,  Wt Readings from Last 3 Encounters:  01/15/20 (!) 306 lb 3.2 oz (138.9 kg)  09/14/19 (!) 310 lb (140.6 kg)  07/17/19 (!) 305 lb 12.8 oz (138.7 kg)   Her blood pressure has been controlled at home (110-120s/70-80s), today their BP is BP: 122/82 She does workout. She denies chest pain, shortness of breath,  dizziness.   She is not on cholesterol medication and denies myalgias. Her cholesterol is not at goal. The cholesterol last visit was:   Lab Results  Component Value Date   CHOL 191 07/17/2019   HDL 51 07/17/2019   LDLCALC 119 (H) 07/17/2019   TRIG 106 07/17/2019   CHOLHDL 3.7 07/17/2019   She has been working on diet and exercise for prediabetes. Last A1C in the office was:  Lab Results  Component Value Date   HGBA1C 5.7 (H) 07/17/2019   Last GFR: Lab Results  Component Value Date   GFRAA 99 07/17/2019   Patient is on Vitamin D supplement, hx of fatigue improved since.  Lab Results  Component Value Date   VD25OH 45 08/29/2018     She is on a B12 supplement with last check in normal range:  Lab Results  Component Value Date   VITAMINB12 923 08/29/2018   Lab Results  Component Value Date   IRON 60 08/29/2018   TIBC 368 08/29/2018     Current Medications:  Current Outpatient Medications on File Prior to Visit  Medication Sig Dispense Refill  . acyclovir (ZOVIRAX) 800 MG tablet Take 1 tablet 2 x /day for 5 days as needed for Herpes Flare & repea tas needed 180 tablet 0  . ALPRAZolam (XANAX) 0.5 MG tablet Take 0.5 mg by mouth 2 (two) times daily as needed for anxiety.     Marland Kitchen amLODipine (NORVASC) 5 MG tablet TAKE 1 TABLET EVERY NIGHT FOR BBLOOD PRESSURE 90 tablet 0  . cetirizine (ZYRTEC) 10 MG tablet Take 10 mg by mouth daily.    . Cholecalciferol (VITAMIN D3) 1.25 MG (50000 UT) CAPS TAKE 1 CAPSULE ONCE A WEEK FOR VITAMIN D DEFICIENCY 12 capsule 3  . diclofenac (FLECTOR) 1.3 % PTCH Place 1 patch onto the skin daily as needed (shoulder pain).    . Digestive Enzymes (DIGESTIVE ENZYME PO) Take 1 capsule by mouth daily.     Marland Kitchen losartan-hydrochlorothiazide (HYZAAR) 100-25 MG tablet TAKE 1 TABLET EVERY MORNING FOR BP & FLUID RETENTION / ANKLE SWELLING 90 tablet 0  . Multiple Vitamins-Minerals (MULTIVITAMIN ADULT PO) Take by mouth.    . Omega-3 Fatty Acids (FISH OIL) 1000 MG CAPS  Take 1 capsule by mouth daily.    . phentermine 37.5 MG capsule Take 1 capsule (37.5 mg total) by mouth every morning. 30 capsule 2  . topiramate (TOPAMAX) 25 MG tablet Take 1 to 2 tablets at Bedtime for Dieting & Weight Loss 180 tablet 0  . Turmeric 500 MG TABS Take 1 capsule by mouth daily.     . vitamin B-12 (CYANOCOBALAMIN) 1000 MCG tablet Take 1,000 mcg by mouth daily.    Marland Kitchen triamcinolone ointment (KENALOG) 0.5 % Apply 1 application topically 2 (two) times daily. (Patient not taking: Reported on 01/15/2020) 30 g 0   No current facility-administered medications on file prior to visit.   Allergies:  Allergies  Allergen Reactions  . Morphine Other (See Comments)    Other  Reaction: hallucinations Hallucinations   . Echinacea-Golden Seal [Nutritional Supplements]     Specifically tea   Medical History:  She has Hypertension; Uterine fibroid; Morbid obesity with BMI of 50.0-59.9, adult ([place]); Vitamin D deficiency; Anxiety; Hyperlipemia; and Prediabetes on their problem list. Health Maintenance:   Immunization History  Administered Date(s) Administered  . Hepatitis A, Adult 07/06/2004  . Hepatitis B, adult 07/06/2004  . Influenza Inj Mdck Quad With Preservative 04/10/2019  . Influenza,inj,Quad PF,6+ Mos 07/06/2008, 05/07/2011, 08/02/2013, 04/01/2016, 05/24/2017, 04/11/2018  . PFIZER SARS-COV-2 Vaccination 09/10/2019, 10/11/2019  . Tdap 03/21/2018  . Tetanus 07/06/2004   Tetanus:2019 Flu vaccine: 04/2019 Shingrix: discuss at age 70 Covid 58: 2/2, 2021, pfizer  LMP: No LMP recorded. (Menstrual status: Perimenopausal). Pap: goes to GYN annually, last PAP 2020 due to hx of abnormal in 2019 MGM:  DEXA:  Colonoscopy: 08/2018, Dr. Henrene Pastor, normal, 10 year EGD:   Last Dental Exam: Dr. Phineas Douglas, last 2021, goes q52m Last Eye Exam: [facility], last 2021, last week, glasses   Patient Care Team: Unk Pinto, MD as PCP - General (Internal Medicine)  Surgical History:  She has a  past surgical history that includes Burn debridement surgery (2000); Reduction mammaplasty; and Colonoscopy with propofol (N/A, 08/15/2018). Family History:  Herfamily history includes Bone cancer in her brother; Diabetes in her mother; Diabetes type II in her maternal grandfather; Hypertension in her father and mother; Ovarian cancer in her maternal grandmother. Social History:  She reports that she quit smoking about 27 years ago. Her smoking use included cigarettes. She started smoking about 30 years ago. She has a 1.50 pack-year smoking history. She has never used smokeless tobacco. She reports current alcohol use of about 2.0 standard drinks of alcohol per week. She reports previous drug use. Drug: Marijuana.  Review of Systems: Review of Systems  Constitutional: Negative for malaise/fatigue and weight loss.  HENT: Negative for hearing loss and tinnitus.   Eyes: Negative for blurred vision and double vision.  Respiratory: Negative for cough, shortness of breath and wheezing.   Cardiovascular: Negative for chest pain, palpitations, orthopnea, claudication and leg swelling.  Gastrointestinal: Negative for abdominal pain, blood in stool, constipation, diarrhea, heartburn, melena, nausea and vomiting.  Genitourinary: Negative.   Musculoskeletal: Negative for joint pain and myalgias.  Skin: Negative for rash.  Neurological: Negative for dizziness, tingling, sensory change, weakness and headaches.  Endo/Heme/Allergies: Negative for polydipsia.  Psychiatric/Behavioral: Negative.   All other systems reviewed and are negative.   Physical Exam: Estimated body mass index is 51.35 kg/m as calculated from the following:   Height as of this encounter: 5' 4.75" (1.645 m).   Weight as of this encounter: 306 lb 3.2 oz (138.9 kg). BP 122/82   Pulse 84   Temp (!) 96.6 F (35.9 C)   Ht 5' 4.75" (1.645 m)   Wt (!) 306 lb 3.2 oz (138.9 kg)   SpO2 99%   BMI 51.35 kg/m  General Appearance: Well  nourished, in no apparent distress.  Eyes: PERRLA, EOMs, conjunctiva no swelling or erythema. Sinuses: No Frontal/maxillary tenderness  ENT/Mouth: Ext aud canals clear, normal light reflex with TMs without erythema, bulging. Good dentition. No erythema, swelling, or exudate on post pharynx. Tonsils not swollen or erythematous. Hearing normal.  Neck: Supple, thyroid normal. No bruits  Respiratory: Respiratory effort normal, BS equal bilaterally without rales, rhonchi, wheezing or stridor.  Cardio: RRR without murmurs, rubs or gallops. Brisk peripheral pulses without edema.  Chest: symmetric, with normal excursions and percussion.  Breasts: Defer to GYN Abdomen: Soft, obese abdomen, nontender, no guarding, rebound, hernias, masses, or organomegaly.  Lymphatics: Non tender without lymphadenopathy.  Genitourinary: defer to GYN Musculoskeletal: Full ROM all peripheral extremities,5/5 strength, and normal gait.  Skin: Warm, dry without rashes, lesions, ecchymosis. Neuro: Cranial nerves intact, reflexes equal bilaterally. Normal muscle tone, no cerebellar symptoms. Sensation intact.  Psych: Awake and oriented X 3, normal affect, Insight and Judgment appropriate.   EKG: Sinus bradycardia with sinus arrhythmia  Gorden Harms Nalaya Wojdyla 6:09 PM [facility]

## 2020-01-15 ENCOUNTER — Encounter: Payer: Self-pay | Admitting: Adult Health

## 2020-01-15 ENCOUNTER — Other Ambulatory Visit: Payer: Self-pay

## 2020-01-15 ENCOUNTER — Ambulatory Visit (INDEPENDENT_AMBULATORY_CARE_PROVIDER_SITE_OTHER): Payer: BC Managed Care – PPO | Admitting: Adult Health

## 2020-01-15 VITALS — BP 122/82 | HR 84 | Temp 96.6°F | Ht 64.75 in | Wt 306.2 lb

## 2020-01-15 DIAGNOSIS — I10 Essential (primary) hypertension: Secondary | ICD-10-CM

## 2020-01-15 DIAGNOSIS — D259 Leiomyoma of uterus, unspecified: Secondary | ICD-10-CM

## 2020-01-15 DIAGNOSIS — D649 Anemia, unspecified: Secondary | ICD-10-CM

## 2020-01-15 DIAGNOSIS — E559 Vitamin D deficiency, unspecified: Secondary | ICD-10-CM | POA: Diagnosis not present

## 2020-01-15 DIAGNOSIS — Z79899 Other long term (current) drug therapy: Secondary | ICD-10-CM

## 2020-01-15 DIAGNOSIS — Z13220 Encounter for screening for lipoid disorders: Secondary | ICD-10-CM | POA: Diagnosis not present

## 2020-01-15 DIAGNOSIS — Z1389 Encounter for screening for other disorder: Secondary | ICD-10-CM | POA: Diagnosis not present

## 2020-01-15 DIAGNOSIS — Z1329 Encounter for screening for other suspected endocrine disorder: Secondary | ICD-10-CM

## 2020-01-15 DIAGNOSIS — Z131 Encounter for screening for diabetes mellitus: Secondary | ICD-10-CM | POA: Diagnosis not present

## 2020-01-15 DIAGNOSIS — Z136 Encounter for screening for cardiovascular disorders: Secondary | ICD-10-CM | POA: Diagnosis not present

## 2020-01-15 DIAGNOSIS — Z0000 Encounter for general adult medical examination without abnormal findings: Secondary | ICD-10-CM

## 2020-01-15 DIAGNOSIS — R5383 Other fatigue: Secondary | ICD-10-CM

## 2020-01-15 DIAGNOSIS — Z130 Encounter for screening for diseases of the blood and blood-forming organs and certain disorders involving the immune mechanism: Secondary | ICD-10-CM | POA: Diagnosis not present

## 2020-01-15 DIAGNOSIS — R7303 Prediabetes: Secondary | ICD-10-CM

## 2020-01-15 DIAGNOSIS — E782 Mixed hyperlipidemia: Secondary | ICD-10-CM

## 2020-01-15 DIAGNOSIS — F419 Anxiety disorder, unspecified: Secondary | ICD-10-CM

## 2020-01-15 NOTE — Patient Instructions (Addendum)
  Mary Banks , Thank you for taking time to come for your Annual Wellness Visit. I appreciate your ongoing commitment to your health goals. Please review the following plan we discussed and let me know if I can assist you in the future.   These are the goals we discussed: Goals    . HEMOGLOBIN A1C < 5.7    . LDL CALC < 100    . Weight (lb) < 290 lb (131.5 kg)       This is a list of the screening recommended for you and due dates:  Health Maintenance  Topic Date Due  . Flu Shot  02/04/2020  . Pap Smear  12/04/2021  . Tetanus Vaccine  03/21/2028  . COVID-19 Vaccine  Completed  .  Hepatitis C: One time screening is recommended by Center for Disease Control  (CDC) for  adults born from [birth year] through 1966.   Discontinued  . HIV Screening  Discontinued      Know what a healthy weight is for you (roughly BMI <25) and aim to maintain this  Aim for 7+ servings of fruits and vegetables daily  65-80+ fluid ounces of water or unsweet tea for healthy kidneys  Limit to max 1 drink of alcohol per day; avoid smoking/tobacco  Limit animal fats in diet for cholesterol and heart health - choose grass fed whenever available  Avoid highly processed foods, and foods high in saturated/trans fats  Aim for low stress - take time to unwind and care for your mental health  Aim for 150 min of moderate intensity exercise weekly for heart health, and weights twice weekly for bone health  Aim for 7-9 hours of sleep daily     A great goal to work towards is aiming to get in a serving daily of some of the most nutritionally dense foods - G- BOMBS daily

## 2020-01-16 ENCOUNTER — Encounter: Payer: Self-pay | Admitting: Adult Health

## 2020-01-16 DIAGNOSIS — E538 Deficiency of other specified B group vitamins: Secondary | ICD-10-CM | POA: Insufficient documentation

## 2020-01-16 DIAGNOSIS — R5383 Other fatigue: Secondary | ICD-10-CM | POA: Insufficient documentation

## 2020-01-16 LAB — CBC WITH DIFFERENTIAL/PLATELET
Absolute Monocytes: 532 cells/uL (ref 200–950)
Basophils Absolute: 28 cells/uL (ref 0–200)
Basophils Relative: 0.5 %
Eosinophils Absolute: 62 cells/uL (ref 15–500)
Eosinophils Relative: 1.1 %
HCT: 41.2 % (ref 35.0–45.0)
Hemoglobin: 13.6 g/dL (ref 11.7–15.5)
Lymphs Abs: 2744 cells/uL (ref 850–3900)
MCH: 30.2 pg (ref 27.0–33.0)
MCHC: 33 g/dL (ref 32.0–36.0)
MCV: 91.6 fL (ref 80.0–100.0)
MPV: 10.2 fL (ref 7.5–12.5)
Monocytes Relative: 9.5 %
Neutro Abs: 2234 cells/uL (ref 1500–7800)
Neutrophils Relative %: 39.9 %
Platelets: 315 10*3/uL (ref 140–400)
RBC: 4.5 10*6/uL (ref 3.80–5.10)
RDW: 13.3 % (ref 11.0–15.0)
Total Lymphocyte: 49 %
WBC: 5.6 10*3/uL (ref 3.8–10.8)

## 2020-01-16 LAB — MICROALBUMIN / CREATININE URINE RATIO
Creatinine, Urine: 12 mg/dL — ABNORMAL LOW (ref 20–275)
Microalb, Ur: <0.2 mg/dL

## 2020-01-16 LAB — LIPID PANEL
Cholesterol: 192 mg/dL (ref <200)
HDL: 55 mg/dL (ref >=50)
LDL Cholesterol (Calc): 117 mg/dL (calc) — ABNORMAL HIGH
Non-HDL Cholesterol (Calc): 137 mg/dL (calc) — ABNORMAL HIGH (ref <130)
Total CHOL/HDL Ratio: 3.5 (calc) (ref <5.0)
Triglycerides: 92 mg/dL (ref <150)

## 2020-01-16 LAB — HEMOGLOBIN A1C
Hgb A1c MFr Bld: 5.6 % of total Hgb (ref <5.7)
Mean Plasma Glucose: 114 (calc)
eAG (mmol/L): 6.3 (calc)

## 2020-01-16 LAB — URINALYSIS, ROUTINE W REFLEX MICROSCOPIC
Bilirubin Urine: NEGATIVE
Glucose, UA: NEGATIVE
Hgb urine dipstick: NEGATIVE
Ketones, ur: NEGATIVE
Leukocytes,Ua: NEGATIVE
Nitrite: NEGATIVE
Protein, ur: NEGATIVE
Specific Gravity, Urine: 1.003 (ref 1.001–1.03)
pH: 7.5 (ref 5.0–8.0)

## 2020-01-16 LAB — COMPLETE METABOLIC PANEL WITH GFR
AG Ratio: 1.5 (calc) (ref 1.0–2.5)
ALT: 12 U/L (ref 6–29)
AST: 19 U/L (ref 10–35)
Albumin: 4.3 g/dL (ref 3.6–5.1)
Alkaline phosphatase (APISO): 61 U/L (ref 31–125)
BUN: 11 mg/dL (ref 7–25)
CO2: 29 mmol/L (ref 20–32)
Calcium: 9.7 mg/dL (ref 8.6–10.2)
Chloride: 103 mmol/L (ref 98–110)
Creat: 0.81 mg/dL (ref 0.50–1.10)
GFR, Est African American: 99 mL/min/{1.73_m2} (ref >=60)
GFR, Est Non African American: 85 mL/min/{1.73_m2} (ref >=60)
Globulin: 2.9 g/dL (calc) (ref 1.9–3.7)
Glucose, Bld: 89 mg/dL (ref 65–99)
Potassium: 4.4 mmol/L (ref 3.5–5.3)
Sodium: 140 mmol/L (ref 135–146)
Total Bilirubin: 0.6 mg/dL (ref 0.2–1.2)
Total Protein: 7.2 g/dL (ref 6.1–8.1)

## 2020-01-16 LAB — IRON,TIBC AND FERRITIN PANEL
%SAT: 18 % (calc) (ref 16–45)
Ferritin: 40 ng/mL (ref 16–232)
Iron: 65 ug/dL (ref 40–190)
TIBC: 365 mcg/dL (calc) (ref 250–450)

## 2020-01-16 LAB — VITAMIN D 25 HYDROXY (VIT D DEFICIENCY, FRACTURES): Vit D, 25-Hydroxy: 76 ng/mL (ref 30–100)

## 2020-01-16 LAB — TSH: TSH: 2.02 mIU/L

## 2020-01-16 LAB — VITAMIN B12: Vitamin B-12: 376 pg/mL (ref 200–1100)

## 2020-01-16 LAB — MAGNESIUM: Magnesium: 2.2 mg/dL (ref 1.5–2.5)

## 2020-02-25 ENCOUNTER — Other Ambulatory Visit: Payer: Self-pay | Admitting: Adult Health

## 2020-03-17 ENCOUNTER — Other Ambulatory Visit: Payer: Self-pay | Admitting: Internal Medicine

## 2020-03-25 ENCOUNTER — Other Ambulatory Visit: Payer: Self-pay | Admitting: Adult Health

## 2020-03-25 ENCOUNTER — Other Ambulatory Visit: Payer: Self-pay

## 2020-03-25 ENCOUNTER — Ambulatory Visit (INDEPENDENT_AMBULATORY_CARE_PROVIDER_SITE_OTHER): Payer: BC Managed Care – PPO

## 2020-03-25 DIAGNOSIS — R350 Frequency of micturition: Secondary | ICD-10-CM | POA: Diagnosis not present

## 2020-03-25 DIAGNOSIS — R252 Cramp and spasm: Secondary | ICD-10-CM | POA: Diagnosis not present

## 2020-03-25 NOTE — Progress Notes (Signed)
Patient presents to the office for a nurse visit to provide a urine specimen to rule out a UTI. Patient states for the past couple of weeks, she has experienced Urine Frequency and Pelvic Pressure. Noticed this around her 49th birthday after having several parties.  Patient also here to check a basic metabolic panel with GFR and magnesium levels due to complaining of bilateral leg cramps.  BP checked in office and reading was 124/80.

## 2020-03-26 DIAGNOSIS — R350 Frequency of micturition: Secondary | ICD-10-CM | POA: Diagnosis not present

## 2020-03-26 LAB — URINALYSIS W MICROSCOPIC + REFLEX CULTURE
Bacteria, UA: NONE SEEN /HPF
Bilirubin Urine: NEGATIVE
Glucose, UA: NEGATIVE
Hgb urine dipstick: NEGATIVE
Hyaline Cast: NONE SEEN /LPF
Ketones, ur: NEGATIVE
Leukocyte Esterase: NEGATIVE
Nitrites, Initial: NEGATIVE
Protein, ur: NEGATIVE
RBC / HPF: NONE SEEN /HPF (ref 0–2)
Specific Gravity, Urine: 1.004 (ref 1.001–1.03)
Squamous Epithelial / HPF: NONE SEEN /HPF (ref <=5)
WBC, UA: NONE SEEN /HPF (ref 0–5)
pH: 7 (ref 5.0–8.0)

## 2020-03-26 LAB — NO CULTURE INDICATED

## 2020-03-26 LAB — BASIC METABOLIC PANEL WITH GFR
BUN: 14 mg/dL (ref 7–25)
CO2: 30 mmol/L (ref 20–32)
Calcium: 10 mg/dL (ref 8.6–10.4)
Chloride: 101 mmol/L (ref 98–110)
Creat: 0.76 mg/dL (ref 0.50–1.05)
GFR, Est African American: 106 mL/min/{1.73_m2} (ref >=60)
GFR, Est Non African American: 91 mL/min/{1.73_m2} (ref >=60)
Glucose, Bld: 96 mg/dL (ref 65–99)
Potassium: 4.3 mmol/L (ref 3.5–5.3)
Sodium: 139 mmol/L (ref 135–146)

## 2020-03-26 LAB — MAGNESIUM: Magnesium: 2 mg/dL (ref 1.5–2.5)

## 2020-03-27 NOTE — Progress Notes (Signed)
Assessment and Plan:  [place] was seen today for flank pain.  Diagnoses and all orders for this visit:  Urinary frequency UA was normal, normal recent BGL she correlates with BP med though unclear why stopping amlodipine might help Suggested stop hyzaar, diuretic may be contributing; monitor urinary frequency and BP for 1-2 weeks and follow up with progress.  If persistent consider OAB - trial of myrbetriq/oxybutynin, or IC, low dose amitriptyline - may refer to urology if preferred  Follow up sooner if worse  Right-sided chest wall pain/flank pain HPI and exam most suggestive of MSK etiology, possible muscle strain Discussed rest, ice/heat, NSAIDs, avoiding straining/other aggravating activities Follow up if any changes or if not improving with antiinflammatory and rest -     meloxicam (MOBIC) 15 MG tablet; Take one daily with food for 2 weeks, can take with tylenol, can not take with aleve, iburpofen, then as needed daily for pain   Further disposition pending results of labs. Discussed med's effects and SE's.   Over 15 minutes of exam, counseling, chart review, and critical decision making was performed.   Future Appointments  Date Time Provider [place]  04/24/2020  4:00 PM Liane Comber, NP [facility] None  07/25/2020  4:00 PM Liane Comber, NP [facility] None  01/15/2021  2:00 PM Liane Comber, NP [facility] None    ------------------------------------------------------------------------------------------------------------------   HPI BP 124/80   Pulse 88   Temp (!) 97.3 F (36.3 C)   Wt (!) 304 lb (137.9 kg)   SpO2 99%   BMI 50.98 kg/m   49 y.o.female with htn, morbid obesity presents for evaluation of BP, and c/o leg cramps and urinary frequency without dysuria or urgency, incontinence. She had labs on 03/25/2020 - BMP, mag, UA relfex culture to r/o UTI and electrolyte abnormalities (unremarkable) but was unable to see provider at that time due to full  schedule. She reports since that time muscle cramps have fully resolved.   She felt sx were worse taking losartan/HCTZ and amlodipine, states stopped taking amlodipine, losartan/HCTZ only in AM, feels frequency has improved but not resolved.  Her blood pressure has been controlled at home, today their BP is BP: 124/80  She does workout. She denies chest pain, shortness of breath, dizziness.  She reports 2 weeks of flank/side pain She reports constant, dull ache, some sharp stabbing dependent on position and with movement (any twisting), no pain when lying still supine. Hasn't tried taking anything for pain.  She denies n/v/d/c, fever/chills, having 1-2 good BMs, normal, no straining,  Denies hx of renal calculi, had CT abd/pelvis without stones mentioned, had colonoscopy in 08/2018 by Dr. Hilarie Fredrickson that showed no polyps, sigmoid diverticulosis.   BMI is Body mass index is 50.98 kg/m., she has been working on diet and exercise. Working with trainer for weight loss -  Wt Readings from Last 3 Encounters:  03/28/20 (!) 304 lb (137.9 kg)  01/15/20 (!) 306 lb 3.2 oz (138.9 kg)  09/14/19 (!) 310 lb (140.6 kg)      Past Medical History:  Diagnosis Date  . Anemia   . Anxiety and depression   . Cervical high risk HPV (human papillomavirus) test positive 12/08/2019  . Fatigue 12/13/2018  . Herpes   . Hypertension   . Sigmoid iverticulosis 12/08/2019     Allergies  Allergen Reactions  . Morphine Other (See Comments)    Other Reaction: hallucinations Hallucinations   . Echinacea-Golden Seal [Nutritional Supplements]     Specifically tea  Current Outpatient Medications on File Prior to Visit  Medication Sig  . acyclovir (ZOVIRAX) 800 MG tablet Take 1 tablet 2 x /day for 5 days as needed for Herpes Flare & repea tas needed  . ALPRAZolam (XANAX) 0.5 MG tablet Take 0.5 mg by mouth 2 (two) times daily as needed for anxiety.   Marland Kitchen amLODipine (NORVASC) 5 MG tablet TAKE 1 TABLET EVERY NIGHT FOR BBLOOD  PRESSURE  . cetirizine (ZYRTEC) 10 MG tablet Take 10 mg by mouth daily.  . Cholecalciferol (VITAMIN D3) 1.25 MG (50000 UT) CAPS TAKE 1 CAPSULE ONCE A WEEK FOR VITAMIN D DEFICIENCY  . Digestive Enzymes (DIGESTIVE ENZYME PO) Take 1 capsule by mouth daily.   Marland Kitchen losartan-hydrochlorothiazide (HYZAAR) 100-25 MG tablet TAKE 1 TABLET EVERY MORNING FOR BP & FLUID RETENTION / ANKLE SWELLING  . Multiple Vitamins-Minerals (MULTIVITAMIN ADULT PO) Take by mouth.  . Omega-3 Fatty Acids (FISH OIL) 1000 MG CAPS Take 1 capsule by mouth daily.  . phentermine 37.5 MG capsule TAKE 1 CAPSULE BY MOUTH EVERY MORNING  . topiramate (TOPAMAX) 25 MG tablet Take    1 to 2 tablets     at Bedtime     for Dieting & Weight Loss  . triamcinolone ointment (KENALOG) 0.5 % Apply 1 application topically 2 (two) times daily.  . Turmeric 500 MG TABS Take 1 capsule by mouth daily.   . vitamin B-12 (CYANOCOBALAMIN) 1000 MCG tablet Take 1,000 mcg by mouth daily.   No current facility-administered medications on file prior to visit.    ROS: all negative except above.   Physical Exam:  BP 124/80   Pulse 88   Temp (!) 97.3 F (36.3 C)   Wt (!) 304 lb (137.9 kg)   SpO2 99%   BMI 50.98 kg/m   General Appearance: Well nourished, well dressed, morbidly obese AA female in no apparent distress. Eyes: PERRLA, conjunctiva no swelling or erythema ENT/Mouth: Mask in place; Hearing normal.  Neck: Supple Respiratory: Respiratory effort normal, BS equal bilaterally without rales, rhonchi, wheezing or stridor.  Cardio: RRR with no MRGs. Brisk peripheral pulses without edema.  Abdomen: Soft, + BS.  Generalized/nonspecific tenderness to R abdomen, no guarding, rebound, hernias, masses. Lymphatics: Non tender without lymphadenopathy.  Musculoskeletal: Full ROM, 5/5 strength, normal gait. She has tenderness over lower R chest wall.  Skin: Warm, dry without rashes, lesions, ecchymosis.  Neuro: Normal muscle tone, Sensation intact.  Psych:  Awake and oriented X 3, normal affect, Insight and Judgment appropriate.     [name], NP 11:54 AM [facility]

## 2020-03-28 ENCOUNTER — Other Ambulatory Visit: Payer: Self-pay

## 2020-03-28 ENCOUNTER — Encounter: Payer: Self-pay | Admitting: Adult Health

## 2020-03-28 ENCOUNTER — Ambulatory Visit (INDEPENDENT_AMBULATORY_CARE_PROVIDER_SITE_OTHER): Payer: BC Managed Care – PPO | Admitting: Adult Health

## 2020-03-28 VITALS — BP 124/80 | HR 88 | Temp 97.3°F | Wt 304.0 lb

## 2020-03-28 DIAGNOSIS — R0789 Other chest pain: Secondary | ICD-10-CM

## 2020-03-28 DIAGNOSIS — R350 Frequency of micturition: Secondary | ICD-10-CM | POA: Diagnosis not present

## 2020-03-28 MED ORDER — MELOXICAM 15 MG PO TABS: ORAL_TABLET | ORAL | 1 refills | Status: DC

## 2020-03-28 NOTE — Patient Instructions (Signed)
Goals    . HEMOGLOBIN A1C < 5.7    . LDL CALC < 100    . Weight (lb) < 290 lb (131.5 kg)         Costochondritis  Costochondritis is swelling and irritation (inflammation) of the tissue (cartilage) that connects your ribs to your breastbone (sternum). This causes pain in the front of your chest. The pain usually starts gradually and involves more than one rib. What are the causes? The exact cause of this condition is not always known. It results from stress on the cartilage where your ribs attach to your sternum. The cause of this stress could be:  Chest injury (trauma).  Exercise or activity, such as lifting.  Severe coughing. What increases the risk? You may be at higher risk for this condition if you:  Are female.  Are 50?50 years old.  Recently started a new exercise or work activity.  Have low levels of vitamin D.  Have a condition that makes you cough frequently. What are the signs or symptoms? The main symptom of this condition is chest pain. The pain:  Usually starts gradually and can be sharp or dull.  Gets worse with deep breathing, coughing, or exercise.  Gets better with rest.  May be worse when you press on the sternum-rib connection (tenderness). How is this diagnosed? This condition is diagnosed based on your symptoms, medical history, and a physical exam. Your health care provider will check for tenderness when pressing on your sternum. This is the most important finding. You may also have tests to rule out other causes of chest pain. These may include:  A chest X-ray to check for lung problems.  An electrocardiogram (ECG) to see if you have a heart problem that could be causing the pain.  An imaging scan to rule out a chest or rib fracture. How is this treated? This condition usually goes away on its own over time. Your health care provider may prescribe an NSAID to reduce pain and inflammation. Your health care provider may also suggest that  you:  Rest and avoid activities that make pain worse.  Apply heat or cold to the area to reduce pain and inflammation.  Do exercises to stretch your chest muscles. If these treatments do not help, your health care provider may inject a numbing medicine at the sternum-rib connection to help relieve the pain. Follow these instructions at home:  Avoid activities that make pain worse. This includes any activities that use chest, abdominal, and side muscles.  If directed, put ice on the painful area: ? Put ice in a plastic bag. ? Place a towel between your skin and the bag. ? Leave the ice on for 20 minutes, 2-3 times a day.  If directed, apply heat to the affected area as often as told by your health care provider. Use the heat source that your health care provider recommends, such as a moist heat pack or a heating pad. ? Place a towel between your skin and the heat source. ? Leave the heat on for 20-30 minutes. ? Remove the heat if your skin turns bright red. This is especially important if you are unable to feel pain, heat, or cold. You may have a greater risk of getting burned.  Take over-the-counter and prescription medicines only as told by your health care provider.  Return to your normal activities as told by your health care provider. Ask your health care provider what activities are safe for you.  Keep  all follow-up visits as told by your health care provider. This is important. Contact a health care provider if:  You have chills or a fever.  Your pain does not go away or it gets worse.  You have a cough that does not go away (is persistent). Get help right away if:  You have shortness of breath. This information is not intended to replace advice given to you by your health care provider. Make sure you discuss any questions you have with your health care provider. Document Revised: 07/07/2017 Document Reviewed: 10/16/2015 Elsevier Patient Education  [place].      Meloxicam tablets What is this medicine? MELOXICAM (mel OX i cam) is a non-steroidal anti-inflammatory drug (NSAID). It is used to reduce swelling and to treat pain. It may be used for osteoarthritis, rheumatoid arthritis, or juvenile rheumatoid arthritis. This medicine may be used for other purposes; ask your health care provider or pharmacist if you have questions. COMMON BRAND NAME(S): Mobic What should I tell my health care provider before I take this medicine? They need to know if you have any of these conditions:  bleeding disorders  cigarette smoker  coronary artery bypass graft (CABG) surgery within the past 2 weeks  drink more than 3 alcohol-containing drinks per day  heart disease  high blood pressure  history of stomach bleeding  kidney disease  liver disease  lung or breathing disease, like asthma  stomach or intestine problems  an unusual or allergic reaction to meloxicam, aspirin, other NSAIDs, other medicines, foods, dyes, or preservatives  pregnant or trying to get pregnant  breast-feeding How should I use this medicine? Take this medicine by mouth with a full glass of water. Follow the directions on the prescription label. You can take it with or without food. If it upsets your stomach, take it with food. Take your medicine at regular intervals. Do not take it more often than directed. Do not stop taking except on your doctor's advice. A special MedGuide will be given to you by the pharmacist with each prescription and refill. Be sure to read this information carefully each time. Talk to your pediatrician regarding the use of this medicine in children. While this drug may be prescribed for selected conditions, precautions do apply. Patients over 75 years old may have a stronger reaction and need a smaller dose. Overdosage: If you think you have taken too much of this medicine contact a poison control center or emergency room at once. NOTE:  This medicine is only for you. Do not share this medicine with others. What if I miss a dose? If you miss a dose, take it as soon as you can. If it is almost time for your next dose, take only that dose. Do not take double or extra doses. What may interact with this medicine? Do not take this medicine with any of the following medications:  cidofovir  ketorolac This medicine may also interact with the following medications:  aspirin and aspirin-like medicines  certain medicines for blood pressure, heart disease, irregular heart beat  certain medicines for depression, anxiety, or psychotic disturbances  certain medicines that treat or prevent blood clots like warfarin, enoxaparin, dalteparin, apixaban, dabigatran, rivaroxaban  cyclosporine  diuretics  fluconazole  lithium  methotrexate  other NSAIDs, medicines for pain and inflammation, like ibuprofen and naproxen  pemetrexed This list may not describe all possible interactions. Give your health care provider a list of all the medicines, herbs, non-prescription drugs, or dietary supplements  you use. Also tell them if you smoke, drink alcohol, or use illegal drugs. Some items may interact with your medicine. What should I watch for while using this medicine? Tell your doctor or healthcare provider if your symptoms do not start to get better or if they get worse. This medicine may cause serious skin reactions. They can happen weeks to months after starting the medicine. Contact your healthcare provider right away if you notice fevers or flu-like symptoms with a rash. The rash may be red or purple and then turn into blisters or peeling of the skin. Or, you might notice a red rash with swelling of the face, lips or lymph nodes in your neck or under your arms. Do not take other medicines that contain aspirin, ibuprofen, or naproxen with this medicine. Side effects such as stomach upset, nausea, or ulcers may be more likely to occur.  Many medicines available without a prescription should not be taken with this medicine. This medicine can cause ulcers and bleeding in the stomach and intestines at any time during treatment. This can happen with no warning and may cause death. There is increased risk with taking this medicine for a long time. Smoking, drinking alcohol, older age, and poor health can also increase risks. Call your doctor right away if you have stomach pain or blood in your vomit or stool. This medicine does not prevent heart attack or stroke. In fact, this medicine may increase the chance of a heart attack or stroke. The chance may increase with longer use of this medicine and in people who have heart disease. If you take aspirin to prevent heart attack or stroke, talk with your doctor or healthcare provider. What side effects may I notice from receiving this medicine? Side effects that you should report to your doctor or health care professional as soon as possible:  allergic reactions like skin rash, itching or hives, swelling of the face, lips, or tongue  nausea, vomiting  redness, blistering, peeling, or loosening of the skin, including inside the mouth  signs and symptoms of a blood clot such as breathing problems; changes in vision; chest pain; severe, sudden headache; pain, swelling, warmth in the leg; trouble speaking; sudden numbness or weakness of the face, arm, or leg  signs and symptoms of bleeding such as bloody or black, tarry stools; red or dark-brown urine; spitting up blood or brown material that looks like coffee grounds; red spots on the skin; unusual bruising or bleeding from the eye, gums, or nose  signs and symptoms of liver injury like dark yellow or brown urine; general ill feeling or flu-like symptoms; light-colored stools; loss of appetite; nausea; right upper belly pain; unusually weak or tired; yellowing of the eyes or skin  signs and symptoms of stroke like changes in vision; confusion;  trouble speaking or understanding; severe headaches; sudden numbness or weakness of the face, arm, or leg; trouble walking; dizziness; loss of balance or coordination Side effects that usually do not require medical attention (report to your doctor or health care professional if they continue or are bothersome):  constipation  diarrhea  gas This list may not describe all possible side effects. Call your doctor for medical advice about side effects. You may report side effects to FDA at 1-800-FDA-1088. Where should I keep my medicine? Keep out of the reach of children. Store at room temperature between 15 and 30 degrees C (59 and 86 degrees F). Throw away any unused medicine after the expiration date. NOTE:  This sheet is a summary. It may not cover all possible information. If you have questions about this medicine, talk to your doctor, pharmacist, or health care provider.  2020 Elsevier/Gold Standard (2018-09-21 11:21:28)

## 2020-03-31 ENCOUNTER — Other Ambulatory Visit: Payer: Self-pay | Admitting: Adult Health

## 2020-04-22 NOTE — Progress Notes (Signed)
FOLLOW UP  Assessment and Plan:   Hypertension At goal; continue current medications Monitor blood pressure at home; patient to call if consistently greater than 130/80 Continue DASH diet.   Reminder to go to the ER if any CP, SOB, nausea, dizziness, severe HA, changes vision/speech, left arm numbness and tingling and jaw pain.  Morbid Obesity with co morbidities Long discussion counseling about weight loss, diet, and exercise Recommended diet heavy in fruits and veggies and low in animal meats, cheeses, and dairy products, appropriate calorie intake Discussed ideal weight for height; first weight goal is <300 lb, then <290 lb Patient will continue with portion control, stress management, prioritizing sleep, exercise She is working with [employer] app and Physiological scientist;  Working to establish good lifestyle habits rather than "diet"  Patient on phentermine and topamax with benefit and no SE, taking drug breaks; continue close follow up.  Follow up in 3 months  Prediabetes Discussed disease and risks Discussed diet/exercise, weight management  Check A1C q68m  Hyperlipidemia Currently working on lifestyle modification for management Continue low cholesterol diet and exercise.  Check lipid panel q20m next visit  Vitamin D Def On 50000 IU weekly with significant perceived benefit continue supplementation to maintain goal of 60-100  B12 Just restarted supplement; requests to defer labs today, recheck B12 in 2-3 weeks  Need for influenza vaccine  Schedule NV with flu vaccine  Continue diet and meds as discussed. Further disposition pending results of labs. Discussed med's effects and SE's.   Over 30 minutes of exam, counseling, chart review, and critical decision making was performed.   Future Appointments  Date Time Provider [place]  05/15/2020  4:20 PM [facility] 2 [facility]  07/25/2020  4:00 PM Liane Comber, NP [facility] None  01/15/2021  2:00 PM  Liane Comber, NP [facility] None    ----------------------------------------------------------------------------------------------------------------------  HPI BP 130/80   Pulse 75   Temp (!) 97.3 F (36.3 C)   Wt (!) 305 lb (138.3 kg)   SpO2 98%   BMI 51.15 kg/m   49 y.o. female  presents for 3 month follow up on hypertension, morbid obesity, hx of prediabetes, hyperlipidemia, vitamin D deficiency, anxiety. She is a Development worker, international aid at [employer], Clinical research associate, also working with grants.    she is prescribed phentermine 37.5 mg 4-5 days a week and topamax 25 mg for weight loss but never restarted in August, just restarted 2 days ago. While on the medication they have lost 0 lb since last visit on our scale. She denes palpitations, anxiety, trouble sleeping, elevated BP.    BMI is Body mass index is 51.15 kg/m., Starting weight was 328 lb. she has been working with [employer] app, had stopped but recently restarted. Finds this helpful with mindful eating and consistently. Works with Physiological scientist twice weekly, walks with new puppy 20 min on other days. Adjusting goals due to recent move and new location.  Wt Readings from Last 3 Encounters:  04/24/20 (!) 305 lb (138.3 kg)  03/28/20 (!) 304 lb (137.9 kg)  01/15/20 (!) 306 lb 3.2 oz (138.9 kg)   Water intake: up to 100 fluid ounces Stress: improved Sleep: 6 hours, working back up to 8 hours  Today their BP is BP: 130/80  She is taking losartan/hctz 100/25 mg daily   She does workout. She denies chest pain, shortness of breath, dizziness.  she has a diagnosis of anxiety and is currently on PRN xanax (0.5 mg BID PRN), reports symptoms  are well controlled on current regimen. she uses xanax rarely, last filled on 09/19/2018 per PDMP.     She is not on cholesterol medication and denies myalgias. Her cholesterol is not at goal. The cholesterol last visit was:   Lab Results  Component Value Date   CHOL 192 01/15/2020   HDL 55 01/15/2020    LDLCALC 117 (H) 01/15/2020   TRIG 92 01/15/2020   CHOLHDL 3.5 01/15/2020    She has been working on diet and exercise for hx of prediabetes, and denies increased appetite, nausea, paresthesia of the feet, polydipsia, polyuria and visual disturbances. Last A1C in the office was:  Lab Results  Component Value Date   HGBA1C 5.6 01/15/2020   Patient is on Vitamin D supplement.   Lab Results  Component Value Date   VD25OH 76 01/15/2020     Back on SL, was having fatigue, restarted a few days ago and feeling much better. She would like levels rechecked in a few weeks.  Lab Results  Component Value Date   [id number] 01/15/2020    Current Medications:  Current Outpatient Medications on File Prior to Visit  Medication Sig  . acyclovir (ZOVIRAX) 800 MG tablet Take 1 tablet 2 x /day for 5 days as needed for Herpes Flare & repea tas needed  . ALPRAZolam (XANAX) 0.5 MG tablet Take 0.5 mg by mouth 2 (two) times daily as needed for anxiety.   . cetirizine (ZYRTEC) 10 MG tablet Take 10 mg by mouth daily.  . Cholecalciferol (VITAMIN D3) 1.25 MG (50000 UT) CAPS TAKE 1 CAPSULE ONCE A WEEK FOR VITAMIN D DEFICIENCY  . Digestive Enzymes (DIGESTIVE ENZYME PO) Take 1 capsule by mouth daily.   Marland Kitchen losartan-hydrochlorothiazide (HYZAAR) 100-25 MG tablet TAKE 1 TABLET EVERY MORNING FOR BP & FLUID RETENTION / ANKLE SWELLING  . Multiple Vitamins-Minerals (MULTIVITAMIN ADULT PO) Take by mouth.  . Omega-3 Fatty Acids (FISH OIL) 1000 MG CAPS Take 1 capsule by mouth daily.  . phentermine 37.5 MG capsule TAKE 1 CAPSULE BY MOUTH EVERY MORNING  . Turmeric 500 MG TABS Take 1 capsule by mouth daily.   . vitamin B-12 (CYANOCOBALAMIN) 1000 MCG tablet Take 1,000 mcg by mouth daily.  Marland Kitchen topiramate (TOPAMAX) 25 MG tablet Take    1 to 2 tablets     at Bedtime     for Dieting & Weight Loss (Patient not taking: Reported on 04/24/2020)  . triamcinolone ointment (KENALOG) 0.5 % Apply 1 application topically 2 (two) times daily.  (Patient not taking: Reported on 04/24/2020)   No current facility-administered medications on file prior to visit.     Allergies:  Allergies  Allergen Reactions  . Morphine Other (See Comments)    Other Reaction: hallucinations Hallucinations   . Echinacea-[place] Seal [Nutritional Supplements]     Specifically tea     Medical History:  Past Medical History:  Diagnosis Date  . Anemia   . Anxiety and depression   . Cervical high risk HPV (human papillomavirus) test positive 12/08/2019  . Fatigue 12/13/2018  . Herpes   . Hypertension   . Sigmoid iverticulosis 12/08/2019   Family history- Reviewed and unchanged Social history- Reviewed and unchanged   Review of Systems:  Review of Systems  Constitutional: Negative for malaise/fatigue and weight loss.  HENT: Negative for hearing loss and tinnitus.   Eyes: Negative for blurred vision and double vision.  Respiratory: Negative for cough, shortness of breath and wheezing.   Cardiovascular: Negative for chest pain, palpitations,  orthopnea, claudication and leg swelling.  Gastrointestinal: Negative for abdominal pain, blood in stool, constipation, diarrhea, heartburn, melena, nausea and vomiting.  Genitourinary: Negative.   Musculoskeletal: Negative for joint pain and myalgias.  Skin: Negative for rash.  Neurological: Negative for dizziness, tingling, sensory change, weakness and headaches.  Endo/Heme/Allergies: Negative for polydipsia.  Psychiatric/Behavioral: Negative.   All other systems reviewed and are negative.    Physical Exam: BP 130/80   Pulse 75   Temp (!) 97.3 F (36.3 C)   Wt (!) 305 lb (138.3 kg)   SpO2 98%   BMI 51.15 kg/m  Wt Readings from Last 3 Encounters:  04/24/20 (!) 305 lb (138.3 kg)  03/28/20 (!) 304 lb (137.9 kg)  01/15/20 (!) 306 lb 3.2 oz (138.9 kg)   General Appearance: Well nourished, morbidly obese (central), in no apparent distress. Eyes: PERRLA, EOMs, conjunctiva no swelling or  erythema Sinuses: No Frontal/maxillary tenderness ENT/Mouth: Ext aud canals clear, TMs without erythema, bulging. No erythema, swelling, or exudate on post pharynx. Tonsils not swollen or erythematous. Hearing normal.  Neck: Supple, thyroid normal.  Respiratory: Respiratory effort normal, BS equal bilaterally without rales, rhonchi, wheezing or stridor.  Cardio: RRR with no MRGs. Brisk peripheral pulses without edema.  Abdomen: Soft, + BS.  Non tender, no guarding, rebound, hernias, masses. Lymphatics: Non tender without lymphadenopathy.  Musculoskeletal: Full ROM, 5/5 strength, Normal gait Skin: Warm, dry without rashes, lesions, ecchymosis.  Neuro: Cranial nerves intact. No cerebellar symptoms.  Psych: Awake and oriented X 3, normal affect, Insight and Judgment appropriate.    [name], NP 4:51 PM [facility]

## 2020-04-24 ENCOUNTER — Ambulatory Visit (INDEPENDENT_AMBULATORY_CARE_PROVIDER_SITE_OTHER): Payer: BC Managed Care – PPO | Admitting: Adult Health

## 2020-04-24 ENCOUNTER — Encounter: Payer: Self-pay | Admitting: Adult Health

## 2020-04-24 ENCOUNTER — Other Ambulatory Visit: Payer: Self-pay

## 2020-04-24 VITALS — BP 130/80 | HR 75 | Temp 97.3°F | Wt 305.0 lb

## 2020-04-24 DIAGNOSIS — E559 Vitamin D deficiency, unspecified: Secondary | ICD-10-CM

## 2020-04-24 DIAGNOSIS — F419 Anxiety disorder, unspecified: Secondary | ICD-10-CM

## 2020-04-24 DIAGNOSIS — E538 Deficiency of other specified B group vitamins: Secondary | ICD-10-CM

## 2020-04-24 DIAGNOSIS — I10 Essential (primary) hypertension: Secondary | ICD-10-CM

## 2020-04-24 DIAGNOSIS — E782 Mixed hyperlipidemia: Secondary | ICD-10-CM

## 2020-04-24 DIAGNOSIS — R7309 Other abnormal glucose: Secondary | ICD-10-CM

## 2020-04-24 DIAGNOSIS — Z6841 Body Mass Index (BMI) 40.0 and over, adult: Secondary | ICD-10-CM

## 2020-04-24 MED ORDER — DICLOFENAC SODIUM 50 MG PO TBEC: 50.0000 mg | DELAYED_RELEASE_TABLET | Freq: Two times a day (BID) | ORAL | 1 refills | Status: DC | PRN

## 2020-05-07 ENCOUNTER — Other Ambulatory Visit: Payer: Self-pay | Admitting: Adult Health

## 2020-05-07 DIAGNOSIS — Z1231 Encounter for screening mammogram for malignant neoplasm of breast: Secondary | ICD-10-CM

## 2020-05-15 ENCOUNTER — Other Ambulatory Visit: Payer: Self-pay

## 2020-05-15 ENCOUNTER — Other Ambulatory Visit: Payer: Self-pay | Admitting: Adult Health

## 2020-05-15 ENCOUNTER — Encounter: Payer: Self-pay | Admitting: Nurse Practitioner

## 2020-05-15 ENCOUNTER — Ambulatory Visit
Admission: RE | Admit: 2020-05-15 | Discharge: 2020-05-15 | Disposition: A | Discharge status: Home / Self Care | Payer: BC Managed Care – PPO | Source: Ambulatory Visit

## 2020-05-15 DIAGNOSIS — N760 Acute vaginitis: Secondary | ICD-10-CM

## 2020-05-15 DIAGNOSIS — Z1231 Encounter for screening mammogram for malignant neoplasm of breast: Secondary | ICD-10-CM

## 2020-05-15 MED ORDER — FLUCONAZOLE 150 MG PO TABS: 150.0000 mg | ORAL_TABLET | Freq: Once | ORAL | 1 refills | Status: AC

## 2020-05-15 MED ORDER — CLOTRIMAZOLE-BETAMETHASONE 1-0.05 % EX CREA: 1.0000 "application " | TOPICAL_CREAM | Freq: Two times a day (BID) | CUTANEOUS | 0 refills | Status: DC

## 2020-05-16 ENCOUNTER — Ambulatory Visit (INDEPENDENT_AMBULATORY_CARE_PROVIDER_SITE_OTHER): Payer: BC Managed Care – PPO

## 2020-05-16 ENCOUNTER — Other Ambulatory Visit: Payer: Self-pay

## 2020-05-16 VITALS — Temp 97.7°F | Wt 311.0 lb

## 2020-05-16 DIAGNOSIS — Z23 Encounter for immunization: Secondary | ICD-10-CM | POA: Diagnosis not present

## 2020-05-16 DIAGNOSIS — E538 Deficiency of other specified B group vitamins: Secondary | ICD-10-CM | POA: Diagnosis not present

## 2020-05-16 LAB — VITAMIN B12: Vitamin B-12: >2000 pg/mL — ABNORMAL HIGH (ref 200–1100)

## 2020-05-16 NOTE — Progress Notes (Signed)
Patient reports for LAB & FLU vaccine.  Vaccine given in LEFT arm & patient was taken to the lab.  Orders were entered in by provider & released at in take.

## 2020-05-17 NOTE — Progress Notes (Signed)
Assessment and Plan:  Akeema was seen today for weight loss.  Diagnoses and all orders for this visit:  Morbid obesity with BMI of 50.0-59.9, adult ([place]) Impaired fasting glucose - 103 10/18/2017 Hx of prediabetes She is interested in semiglutide; discussed current lack of formulary coverage, no coupons available; she may qualify for ozempic due to abnormal glucose history Discussed risks and benefits of medication, potential SE; she would like to proceed with trial of medication Given ozempic pen sample; 0.25 mg weekly for 4 weeks then 0.5 mg weekly until follow up if tolerating; follow up sooner if intolerance or other concerns Long discussion about weight loss, diet, and exercise Recommended diet heavy in fruits and veggies and low in animal meats, cheeses, and dairy products, appropriate calorie intake Follow up at next visit -     Semaglutide,0.25 or 0.5MG /DOS, (OZEMPIC, 0.25 OR 0.5 MG/DOSE,) 2 MG/1.5ML SOPN; Inject 0.25 mg weekly into skin for 4 weeks, then 0.5 mg weekly. -     Semaglutide,0.25 or 0.5MG /DOS, (OZEMPIC, 0.25 OR 0.5 MG/DOSE,) 2 MG/1.5ML SOPN; Inject 0.5 mg into the skin once a week.  Further disposition pending results of labs. Discussed med's effects and SE's.   Over 30 minutes of exam, counseling, chart review, and critical decision making was performed.   Future Appointments  Date Time Provider [place]  06/06/2020 10:30 AM Karma Ganja, NP [place] None  07/23/2020  4:40 PM [facility] MM 2 [facility]  07/25/2020  4:00 PM Liane Comber, NP [facility] None  01/15/2021  2:00 PM Liane Comber, NP [facility] None    ------------------------------------------------------------------------------------------------------------------   HPI 49 y.o.female with morbid obesity and hx of prediabetes presents for evaluation of weight loss.   she is prescribed phentermine for weight loss.  While on the medication they have lost 4 lbs since last visit. They deny  palpitations, anxiety, trouble sleeping, elevated BP.   BMI is Body mass index is 51.65 kg/m., she is working on diet and exercise. Weight down from peak of 327 lb in 12/2018.  She has intermittently been able to lose weight with phentermine but struggles with consistency, interested in semaglutide after seeing excellent progress in others who tried this. She reports always feeling hungry, never satisfied.   Wt Readings from Last 3 Encounters:  05/20/20 (!) 308 lb (139.7 kg)  05/16/20 (!) 311 lb (141.1 kg)  04/24/20 (!) 305 lb (138.3 kg)    Past Medical History:  Diagnosis Date  . Anemia   . Anxiety and depression   . Cervical high risk HPV (human papillomavirus) test positive 12/08/2019  . Fatigue 12/13/2018  . Herpes   . Hypertension   . Sigmoid iverticulosis 12/08/2019     Allergies  Allergen Reactions  . Morphine Other (See Comments)    Other Reaction: hallucinations Hallucinations   . Echinacea-Gold[place] Seal [Nutritional Supplements]     Specifically tea    Current Outpatient Medications on File Prior to Visit  Medication Sig  . acyclovir (ZOVIRAX) 800 MG tablet Take 1 tablet 2 x /day for 5 days as needed for Herpes Flare & repea tas needed  . ALPRAZolam (XANAX) 0.5 MG tablet Take 0.5 mg by mouth 2 (two) times daily as needed for anxiety.   . cetirizine (ZYRTEC) 10 MG tablet Take 10 mg by mouth daily.  . Cholecalciferol (VITAMIN D3) 1.25 MG (50000 UT) CAPS TAKE 1 CAPSULE ONCE A WEEK FOR VITAMIN D DEFICIENCY  . clotrimazole-betamethasone (LOTRISONE) cream Apply 1 application topically 2 (two) times daily.  Marland Kitchen  diclofenac (VOLTAREN) 50 MG EC tablet Take 1 tablet (50 mg total) by mouth 2 (two) times daily as needed. Take with food, as needed for knee pain.  . Digestive Enzymes (DIGESTIVE ENZYME PO) Take 1 capsule by mouth daily.   Marland Kitchen losartan-hydrochlorothiazide (HYZAAR) 100-25 MG tablet TAKE 1 TABLET EVERY MORNING FOR BP & FLUID RETENTION / ANKLE SWELLING  . Multiple  Vitamins-Minerals (MULTIVITAMIN ADULT PO) Take by mouth.  . Omega-3 Fatty Acids (FISH OIL) 1000 MG CAPS Take 1 capsule by mouth daily.  . phentermine 37.5 MG capsule TAKE 1 CAPSULE BY MOUTH EVERY MORNING  . topiramate (TOPAMAX) 25 MG tablet Take    1 to 2 tablets     at Bedtime     for Dieting & Weight Loss  . triamcinolone ointment (KENALOG) 0.5 % Apply 1 application topically 2 (two) times daily.  . Turmeric 500 MG TABS Take 1 capsule by mouth daily.   . vitamin B-12 (CYANOCOBALAMIN) 1000 MCG tablet Take 1,000 mcg by mouth daily.   No current facility-administered medications on file prior to visit.    ROS: all negative except above.   Physical Exam:  BP 112/80   Pulse 81   Temp (!) 96.6 F (35.9 C)   Wt (!) 308 lb (139.7 kg)   SpO2 98%   BMI 51.65 kg/m   General Appearance: Well nourished, in no apparent distress. Eyes: PERRLA, EOMs, conjunctiva no swelling or erythema Sinuses: No Frontal/maxillary tenderness ENT/Mouth: Ext aud canals clear, TMs without erythema, bulging. No erythema, swelling, or exudate on post pharynx.  Tonsils not swollen or erythematous. Hearing normal.  Neck: Supple, thyroid normal.  Respiratory: Respiratory effort normal, BS equal bilaterally without rales, rhonchi, wheezing or stridor.  Cardio: RRR with no MRGs. Brisk peripheral pulses without edema.  Abdomen: Soft, + BS.  Non tender, no guarding, rebound, hernias, masses. Lymphatics: Non tender without lymphadenopathy.  Musculoskeletal: Full ROM, 5/5 strength, normal gait.  Skin: Warm, dry without rashes, lesions, ecchymosis.  Neuro: Cranial nerves intact. Normal muscle tone, no cerebellar symptoms. Sensation intact.  Psych: Awake and oriented X 3, normal affect, Insight and Judgment appropriate.     [name], NP 2:54 PM [facility]

## 2020-05-20 ENCOUNTER — Other Ambulatory Visit: Payer: Self-pay

## 2020-05-20 ENCOUNTER — Encounter: Payer: Self-pay | Admitting: Adult Health

## 2020-05-20 ENCOUNTER — Ambulatory Visit (INDEPENDENT_AMBULATORY_CARE_PROVIDER_SITE_OTHER): Payer: BC Managed Care – PPO | Admitting: Adult Health

## 2020-05-20 VITALS — BP 112/80 | HR 81 | Temp 96.6°F | Wt 308.0 lb

## 2020-05-20 DIAGNOSIS — R7301 Impaired fasting glucose: Secondary | ICD-10-CM | POA: Diagnosis not present

## 2020-05-20 DIAGNOSIS — R7309 Other abnormal glucose: Secondary | ICD-10-CM

## 2020-05-20 DIAGNOSIS — Z6841 Body Mass Index (BMI) 40.0 and over, adult: Secondary | ICD-10-CM | POA: Diagnosis not present

## 2020-05-20 MED ORDER — OZEMPIC (0.25 OR 0.5 MG/DOSE) 2 MG/1.5ML ~~LOC~~ SOPN: 0.5000 mg | PEN_INJECTOR | SUBCUTANEOUS | 0 refills | Status: DC

## 2020-05-20 MED ORDER — OZEMPIC (0.25 OR 0.5 MG/DOSE) 2 MG/1.5ML ~~LOC~~ SOPN: PEN_INJECTOR | SUBCUTANEOUS | 0 refills | Status: DC

## 2020-05-20 NOTE — Patient Instructions (Addendum)
Start 0.25 mg weekly for 4 weeks, then if tolerating increase to 0.5 mg weekly   Then if doing well will increase to 1 mg weekly     Semaglutide injection solution What is this medicine? SEMAGLUTIDE (Sem a GLOO tide) is used to improve blood sugar control in adults with type 2 diabetes. This medicine may be used with other diabetes medicines. This drug may also reduce the risk of heart attack or stroke if you have type 2 diabetes and risk factors for heart disease. This medicine may be used for other purposes; ask your health care provider or pharmacist if you have questions. COMMON BRAND NAME(S): OZEMPIC What should I tell my health care provider before I take this medicine? They need to know if you have any of these conditions:  endocrine tumors (MEN 2) or if someone in your family had these tumors  eye disease, vision problems  history of pancreatitis  kidney disease  stomach problems  thyroid cancer or if someone in your family had thyroid cancer  an unusual or allergic reaction to semaglutide, other medicines, foods, dyes, or preservatives  pregnant or trying to get pregnant  breast-feeding How should I use this medicine? This medicine is for injection under the skin of your upper leg (thigh), stomach area, or upper arm. It is given once every week (every 7 days). You will be taught how to prepare and give this medicine. Use exactly as directed. Take your medicine at regular intervals. Do not take it more often than directed. If you use this medicine with insulin, you should inject this medicine and the insulin separately. Do not mix them together. Do not give the injections right next to each other. Change (rotate) injection sites with each injection. It is important that you put your used needles and syringes in a special sharps container. Do not put them in a trash can. If you do not have a sharps container, call your pharmacist or healthcare provider to get one. A  special MedGuide will be given to you by the pharmacist with each prescription and refill. Be sure to read this information carefully each time. This drug comes with INSTRUCTIONS FOR USE. Ask your pharmacist for directions on how to use this drug. Read the information carefully. Talk to your pharmacist or health care provider if you have questions. Talk to your pediatrician regarding the use of this medicine in children. Special care may be needed. Overdosage: If you think you have taken too much of this medicine contact a poison control center or emergency room at once. NOTE: This medicine is only for you. Do not share this medicine with others. What if I miss a dose? If you miss a dose, take it as soon as you can within 5 days after the missed dose. Then take your next dose at your regular weekly time. If it has been longer than 5 days after the missed dose, do not take the missed dose. Take the next dose at your regular time. Do not take double or extra doses. If you have questions about a missed dose, contact your health care provider for advice. What may interact with this medicine?  other medicines for diabetes Many medications may cause changes in blood sugar, these include:  alcohol containing beverages  antiviral medicines for HIV or AIDS  aspirin and aspirin-like drugs  certain medicines for blood pressure, heart disease, irregular heart beat  chromium  diuretics  female hormones, such as estrogens or progestins, birth control pills  fenofibrate  gemfibrozil  isoniazid  lanreotide  female hormones or anabolic steroids  MAOIs like Carbex, Eldepryl, Marplan, Nardil, and Parnate  medicines for weight loss  medicines for allergies, asthma, cold, or cough  medicines for depression, anxiety, or psychotic disturbances  niacin  nicotine  NSAIDs, medicines for pain and inflammation, like ibuprofen or  naproxen  octreotide  pasireotide  pentamidine  phenytoin  probenecid  quinolone antibiotics such as ciprofloxacin, levofloxacin, ofloxacin  some herbal dietary supplements  steroid medicines such as prednisone or cortisone  sulfamethoxazole; trimethoprim  thyroid hormones Some medications can hide the warning symptoms of low blood sugar (hypoglycemia). You may need to monitor your blood sugar more closely if you are taking one of these medications. These include:  beta-blockers, often used for high blood pressure or heart problems (examples include atenolol, metoprolol, propranolol)  clonidine  guanethidine  reserpine This list may not describe all possible interactions. Give your health care provider a list of all the medicines, herbs, non-prescription drugs, or dietary supplements you use. Also tell them if you smoke, drink alcohol, or use illegal drugs. Some items may interact with your medicine. What should I watch for while using this medicine? Visit your doctor or health care professional for regular checks on your progress. Drink plenty of fluids while taking this medicine. Check with your doctor or health care professional if you get an attack of severe diarrhea, nausea, and vomiting. The loss of too much body fluid can make it dangerous for you to take this medicine. A test called the HbA1C (A1C) will be monitored. This is a simple blood test. It measures your blood sugar control over the last 2 to 3 months. You will receive this test every 3 to 6 months. Learn how to check your blood sugar. Learn the symptoms of low and high blood sugar and how to manage them. Always carry a quick-source of sugar with you in case you have symptoms of low blood sugar. Examples include hard sugar candy or glucose tablets. Make sure others know that you can choke if you eat or drink when you develop serious symptoms of low blood sugar, such as seizures or unconsciousness. They must get  medical help at once. Tell your doctor or health care professional if you have high blood sugar. You might need to change the dose of your medicine. If you are sick or exercising more than usual, you might need to change the dose of your medicine. Do not skip meals. Ask your doctor or health care professional if you should avoid alcohol. Many nonprescription cough and cold products contain sugar or alcohol. These can affect blood sugar. Pens should never be shared. Even if the needle is changed, sharing may result in passing of viruses like hepatitis or HIV. Wear a medical ID bracelet or chain, and carry a card that describes your disease and details of your medicine and dosage times. Do not become pregnant while taking this medicine. Women should inform their doctor if they wish to become pregnant or think they might be pregnant. There is a potential for serious side effects to an unborn child. Talk to your health care professional or pharmacist for more information. What side effects may I notice from receiving this medicine? Side effects that you should report to your doctor or health care professional as soon as possible:  allergic reactions like skin rash, itching or hives, swelling of the face, lips, or tongue  breathing problems  changes in vision  diarrhea that  continues or is severe  lump or swelling on the neck  severe nausea  signs and symptoms of infection like fever or chills; cough; sore throat; pain or trouble passing urine  signs and symptoms of low blood sugar such as feeling anxious, confusion, dizziness, increased hunger, unusually weak or tired, sweating, shakiness, cold, irritable, headache, blurred vision, fast heartbeat, loss of consciousness  signs and symptoms of kidney injury like trouble passing urine or change in the amount of urine  trouble swallowing  unusual stomach upset or pain  vomiting Side effects that usually do not require medical attention  (report to your doctor or health care professional if they continue or are bothersome):  constipation  diarrhea  nausea  pain, redness, or irritation at site where injected  stomach upset This list may not describe all possible side effects. Call your doctor for medical advice about side effects. You may report side effects to FDA at 1-800-FDA-1088. Where should I keep my medicine? Keep out of the reach of children. Store unopened pens in a refrigerator between 2 and 8 degrees C (36 and 46 degrees F). Do not freeze. Protect from light and heat. After you first use the pen, it can be stored for 56 days at room temperature between 15 and 30 degrees C (59 and 86 degrees F) or in a refrigerator. Throw away your used pen after 56 days or after the expiration date, whichever comes first. Do not store your pen with the needle attached. If the needle is left on, medicine may leak from the pen. NOTE: This sheet is a summary. It may not cover all possible information. If you have questions about this medicine, talk to your doctor, pharmacist, or health care provider.  2020 Elsevier/Gold Standard (2019-03-07 09:41:51)

## 2020-05-27 ENCOUNTER — Other Ambulatory Visit: Payer: Self-pay

## 2020-05-27 DIAGNOSIS — R7301 Impaired fasting glucose: Secondary | ICD-10-CM

## 2020-05-27 DIAGNOSIS — R7309 Other abnormal glucose: Secondary | ICD-10-CM

## 2020-05-27 MED ORDER — OZEMPIC (0.25 OR 0.5 MG/DOSE) 2 MG/1.5ML ~~LOC~~ SOPN
PEN_INJECTOR | SUBCUTANEOUS | 0 refills | Status: DC
Start: 2020-05-27 — End: 2020-09-01

## 2020-05-27 MED ORDER — OZEMPIC (0.25 OR 0.5 MG/DOSE) 2 MG/1.5ML ~~LOC~~ SOPN: 0.5000 mg | PEN_INJECTOR | SUBCUTANEOUS | 0 refills | Status: DC

## 2020-06-06 ENCOUNTER — Other Ambulatory Visit: Payer: Self-pay

## 2020-06-06 ENCOUNTER — Other Ambulatory Visit (HOSPITAL_COMMUNITY)
Admission: RE | Admit: 2020-06-06 | Discharge: 2020-06-06 | Disposition: A | Discharge status: Home / Self Care | Payer: BC Managed Care – PPO | Source: Ambulatory Visit | Attending: Nurse Practitioner | Admitting: Nurse Practitioner

## 2020-06-06 ENCOUNTER — Ambulatory Visit: Payer: BC Managed Care – PPO | Admitting: Nurse Practitioner

## 2020-06-06 ENCOUNTER — Encounter: Payer: Self-pay | Admitting: Nurse Practitioner

## 2020-06-06 VITALS — BP 118/70 | HR 68 | Resp 16 | Ht 65.25 in | Wt 311.0 lb

## 2020-06-06 DIAGNOSIS — Z0000 Encounter for general adult medical examination without abnormal findings: Secondary | ICD-10-CM | POA: Diagnosis not present

## 2020-06-06 DIAGNOSIS — Z01419 Encounter for gynecological examination (general) (routine) without abnormal findings: Secondary | ICD-10-CM

## 2020-06-06 DIAGNOSIS — N898 Other specified noninflammatory disorders of vagina: Secondary | ICD-10-CM

## 2020-06-06 DIAGNOSIS — D259 Leiomyoma of uterus, unspecified: Secondary | ICD-10-CM | POA: Diagnosis not present

## 2020-06-06 DIAGNOSIS — R102 Pelvic and perineal pain: Secondary | ICD-10-CM | POA: Diagnosis not present

## 2020-06-06 LAB — POCT URINALYSIS DIPSTICK
Bilirubin, UA: NEGATIVE
Blood, UA: NEGATIVE
Glucose, UA: NEGATIVE
Ketones, UA: NEGATIVE
Leukocytes, UA: NEGATIVE
Nitrite, UA: NEGATIVE
Protein, UA: NEGATIVE
Urobilinogen, UA: NEGATIVE E.U./dL — AB
pH, UA: 5 (ref 5.0–8.0)

## 2020-06-06 LAB — HEMOGLOBIN, FINGERSTICK: Hemoglobin: 10.9
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
## 2020-06-06 NOTE — Patient Instructions (Addendum)
Iron-Rich Diet  Iron is a mineral that helps your body to produce hemoglobin. Hemoglobin is a protein in red blood cells that carries oxygen to your body's tissues. Eating too little iron may cause you to feel weak and tired, and it can increase your risk of infection. Iron is naturally found in many foods, and many foods have iron added to them (iron-fortified foods). You may need to follow an iron-rich diet if you do not have enough iron in your body due to certain medical conditions. The amount of iron that you need each day depends on your age, your sex, and any medical conditions you have. Follow instructions from your health care provider or a diet and nutrition specialist (dietitian) about how much iron you should eat each day. What are tips for following this plan? Reading food labels  Check food labels to see how many milligrams (mg) of iron are in each serving. Cooking  Cook foods in pots and pans that are made from iron.  Take these steps to make it easier for your body to absorb iron from certain foods: ? Soak beans overnight before cooking. ? Soak whole grains overnight and drain them before using. ? Ferment flours before baking, such as by using yeast in bread dough. Meal planning  When you eat foods that contain iron, you should eat them with foods that are high in vitamin C. These include oranges, peppers, tomatoes, potatoes, and mango. Vitamin C helps your body to absorb iron. General information  Take iron supplements only as told by your health care provider. An overdose of iron can be life-threatening. If you were prescribed iron supplements, take them with orange juice or a vitamin C supplement.  When you eat iron-fortified foods or take an iron supplement, you should also eat foods that naturally contain iron, such as meat, poultry, and fish. Eating naturally iron-rich foods helps your body to absorb the iron that is added to other foods or contained in a  supplement.  Certain foods and drinks prevent your body from absorbing iron properly. Avoid eating these foods in the same meal as iron-rich foods or with iron supplements. These foods include: ? Coffee, black tea, and red wine. ? Milk, dairy products, and foods that are high in calcium. ? Beans and soybeans. ? Whole grains. What foods should I eat? Fruits Prunes. Raisins. Eat fruits high in vitamin C, such as oranges, grapefruits, and strawberries, alongside iron-rich foods. Vegetables Spinach (cooked). Green peas. Broccoli. Fermented vegetables. Eat vegetables high in vitamin C, such as leafy greens, potatoes, bell peppers, and tomatoes, alongside iron-rich foods. Grains Iron-fortified breakfast cereal. Iron-fortified whole-wheat bread. Enriched rice. Sprouted grains. Meats and other proteins Beef liver. Oysters. Beef. Shrimp. Turkey. Chicken. Tuna. Sardines. Chickpeas. Nuts. Tofu. Pumpkin seeds. Beverages Tomato juice. Fresh orange juice. Prune juice. Hibiscus tea. Fortified instant breakfast shakes. Sweets and desserts Blackstrap molasses. Seasonings and condiments Tahini. Fermented soy sauce. Other foods Wheat germ. The items listed above may not be a complete list of recommended foods and beverages. Contact a dietitian for more information. What foods should I avoid? Grains Whole grains. Bran cereal. Bran flour. Oats. Meats and other proteins Soybeans. Products made from soy protein. Black beans. Lentils. Mung beans. Split peas. Dairy Milk. Cream. Cheese. Yogurt. Cottage cheese. Beverages Coffee. Black tea. Red wine. Sweets and desserts Cocoa. Chocolate. Ice cream. Other foods Basil. Oregano. Large amounts of parsley. The items listed above may not be a complete list of foods and beverages to avoid.   Contact a dietitian for more information. Summary  Iron is a mineral that helps your body to produce hemoglobin. Hemoglobin is a protein in red blood cells that carries  oxygen to your body's tissues.  Iron is naturally found in many foods, and many foods have iron added to them (iron-fortified foods).  When you eat foods that contain iron, you should eat them with foods that are high in vitamin C. Vitamin C helps your body to absorb iron.  Certain foods and drinks prevent your body from absorbing iron properly, such as whole grains and dairy products. You should avoid eating these foods in the same meal as iron-rich foods or with iron supplements. This information is not intended to replace advice given to you by your health care provider. Make sure you discuss any questions you have with your health care provider. Document Revised: 06/04/2017 Document Reviewed: 05/18/2017 Elsevier Patient Education  2020 [employer]. Uterine Fibroids  Uterine fibroids (leiomyomas) are noncancerous (benign) tumors that can develop in the uterus. Fibroids may also develop in the fallopian tubes, cervix, or tissues (ligaments) near the uterus. You may have one or many fibroids. Fibroids vary in size, weight, and where they grow in the uterus. Some can become quite large. Most fibroids do not require medical treatment. What are the causes? The cause of this condition is not known. What increases the risk? You are more likely to develop this condition if you:  Are in your 30s or 40s and have not gone through menopause.  Have a family history of this condition.  Are of African-American descent.  Had your first period at an early age (early menarche).  Have not had any children (nulliparity).  Are overweight or obese. What are the signs or symptoms? Many women do not have any symptoms. Symptoms of this condition may include:  Heavy menstrual bleeding.  Bleeding or spotting between periods.  Pain and pressure in the pelvic area, between the hips.  Bladder problems, such as needing to urinate urgently or more often than usual.  Inability to have children  (infertility).  Failure to carry pregnancy to term (miscarriage). How is this diagnosed? This condition may be diagnosed based on:  Your symptoms and medical history.  A physical exam.  A pelvic exam that includes feeling for any tumors.  Imaging tests, such as ultrasound or MRI. How is this treated? Treatment for this condition may include:  Seeing your health care provider for follow-up visits to monitor your fibroids for any changes.  Taking NSAIDs such as ibuprofen, naproxen, or aspirin to reduce pain.  Hormone medicines. These may be taken as a pill, given in an injection, or delivered by a T-shaped device that is inserted into the uterus (intrauterine device, IUD).  Surgery to remove one of the following: ? The fibroids (myomectomy). Your health care provider may recommend this if fibroids affect your fertility and you want to become pregnant. ? The uterus (hysterectomy). ? Blood supply to the fibroids (uterine artery embolization). Follow these instructions at home:  Take over-the-counter and prescription medicines only as told by your health care provider.  Ask your health care provider if you should take iron pills or eat more iron-rich foods, such as dark green, leafy vegetables. Heavy menstrual bleeding can cause low iron levels.  If directed, apply heat to your back or abdomen to reduce pain. Use the heat source that your health care provider recommends, such as a moist heat pack or a heating pad. ? Place a  towel between your skin and the heat source. ? Leave the heat on for 20-30 minutes. ? Remove the heat if your skin turns bright red. This is especially important if you are unable to feel pain, heat, or cold. You may have a greater risk of getting burned.  Pay close attention to your menstrual cycle. Tell your health care provider about any changes, such as: ? Increased blood flow that requires you to use more pads or tampons than usual. ? A change in the number  of days that your period lasts. ? A change in symptoms that are associated with your period, such as back pain or cramps in your abdomen.  Keep all follow-up visits as told by your health care provider. This is important, especially if your fibroids need to be monitored for any changes. Contact a health care provider if you:  Have pelvic pain, back pain, or cramps in your abdomen that do not get better with medicine or heat.  Develop new bleeding between periods.  Have increased bleeding during or between periods.  Feel unusually tired or weak.  Feel light-headed. Get help right away if you:  Faint.  Have pelvic pain that suddenly gets worse.  Have severe vaginal bleeding that soaks a tampon or pad in 30 minutes or less. Summary  Uterine fibroids are noncancerous (benign) tumors that can develop in the uterus.  The exact cause of this condition is not known.  Most fibroids do not require medical treatment unless they affect your ability to have children (fertility).  Contact a health care provider if you have pelvic pain, back pain, or cramps in your abdomen that do not get better with medicines.  Make sure you know what symptoms should cause you to get help right away. This information is not intended to replace advice given to you by your health care provider. Make sure you discuss any questions you have with your health care provider. Document Revised: 06/04/2017 Document Reviewed: 05/18/2017 Elsevier Patient Education  2020 [employer] Maintenance, Female Adopting a healthy lifestyle and getting preventive care are important in promoting health and wellness. Ask your health care provider about:  The right schedule for you to have regular tests and exams.  Things you can do on your own to prevent diseases and keep yourself healthy. What should I know about diet, weight, and exercise? Eat a healthy diet   Eat a diet that includes plenty of vegetables, fruits,  low-fat dairy products, and lean protein.  Do not eat a lot of foods that are high in solid fats, added sugars, or sodium. Maintain a healthy weight Body mass index (BMI) is used to identify weight problems. It estimates body fat based on height and weight. Your health care provider can help determine your BMI and help you achieve or maintain a healthy weight. Get regular exercise Get regular exercise. This is one of the most important things you can do for your health. Most adults should:  Exercise for at least 150 minutes each week. The exercise should increase your heart rate and make you sweat (moderate-intensity exercise).  Do strengthening exercises at least twice a week. This is in addition to the moderate-intensity exercise.  Spend less time sitting. Even light physical activity can be beneficial. Watch cholesterol and blood lipids Have your blood tested for lipids and cholesterol at 50 years of age, then have this test every 5 years. Have your cholesterol levels checked more often if:  Your lipid or cholesterol levels  are high.  You are older than 50 years of age.  You are at high risk for heart disease. What should I know about cancer screening? Depending on your health history and family history, you may need to have cancer screening at various ages. This may include screening for:  Breast cancer.  Cervical cancer.  Colorectal cancer.  Skin cancer.  Lung cancer. What should I know about heart disease, diabetes, and high blood pressure? Blood pressure and heart disease  High blood pressure causes heart disease and increases the risk of stroke. This is more likely to develop in people who have high blood pressure readings, are of African descent, or are overweight.  Have your blood pressure checked: ? Every 3-5 years if you are 50-1 years of age. ? Every year if you are 74 years old or older. Diabetes Have regular diabetes screenings. This checks your fasting  blood sugar level. Have the screening done:  Once every three years after age 37 if you are at a normal weight and have a low risk for diabetes.  More often and at a younger age if you are overweight or have a high risk for diabetes. What should I know about preventing infection? Hepatitis B If you have a higher risk for hepatitis B, you should be screened for this virus. Talk with your health care provider to find out if you are at risk for hepatitis B infection. Hepatitis C Testing is recommended for:  Everyone born from [birth year] through 1966.  Anyone with known risk factors for hepatitis C. Sexually transmitted infections (STIs)  Get screened for STIs, including gonorrhea and chlamydia, if: ? You are sexually active and are younger than 50 years of age. ? You are older than 50 years of age and your health care provider tells you that you are at risk for this type of infection. ? Your sexual activity has changed since you were last screened, and you are at increased risk for chlamydia or gonorrhea. Ask your health care provider if you are at risk.  Ask your health care provider about whether you are at high risk for HIV. Your health care provider may recommend a prescription medicine to help prevent HIV infection. If you choose to take medicine to prevent HIV, you should first get tested for HIV. You should then be tested every 3 months for as long as you are taking the medicine. Pregnancy  If you are about to stop having your period (premenopausal) and you may become pregnant, seek counseling before you get pregnant.  Take 400 to 800 micrograms (mcg) of folic acid every day if you become pregnant.  Ask for birth control (contraception) if you want to prevent pregnancy. Osteoporosis and menopause Osteoporosis is a disease in which the bones lose minerals and strength with aging. This can result in bone fractures. If you are 63 years old or older, or if you are at risk for osteoporosis and  fractures, ask your health care provider if you should:  Be screened for bone loss.  Take a calcium or vitamin D supplement to lower your risk of fractures.  Be given hormone replacement therapy (HRT) to treat symptoms of menopause. Follow these instructions at home: Lifestyle  Do not use any products that contain nicotine or tobacco, such as cigarettes, e-cigarettes, and chewing tobacco. If you need help quitting, ask your health care provider.  Do not use street drugs.  Do not share needles.  Ask your health care provider for help if  you need support or information about quitting drugs. Alcohol use  Do not drink alcohol if: ? Your health care provider tells you not to drink. ? You are pregnant, may be pregnant, or are planning to become pregnant.  If you drink alcohol: ? Limit how much you use to 0-1 drink a day. ? Limit intake if you are breastfeeding.  Be aware of how much alcohol is in your drink. In the [place] one drink equals one 12 oz bottle of beer (355 mL), one 5 oz glass of wine (148 mL), or one 1 oz glass of hard liquor (44 mL). General instructions  Schedule regular health, dental, and eye exams.  Stay current with your vaccines.  Tell your health care provider if: ? You often feel depressed. ? You have ever been abused or do not feel safe at home. Summary  Adopting a healthy lifestyle and getting preventive care are important in promoting health and wellness.  Follow your health care provider's instructions about healthy diet, exercising, and getting tested or screened for diseases.  Follow your health care provider's instructions on monitoring your cholesterol and blood pressure. This information is not intended to replace advice given to you by your health care provider. Make sure you discuss any questions you have with your health care provider. Document Revised: 06/15/2018 Document Reviewed: 06/15/2018 Elsevier Patient Education  2020 [employer].

## 2020-06-07 ENCOUNTER — Telehealth: Payer: Self-pay

## 2020-06-07 DIAGNOSIS — R102 Pelvic and perineal pain: Secondary | ICD-10-CM

## 2020-06-07 DIAGNOSIS — D259 Leiomyoma of uterus, unspecified: Secondary | ICD-10-CM

## 2020-06-07 LAB — VAGINITIS/VAGINOSIS, DNA PROBE
Candida Species: NEGATIVE
Gardnerella vaginalis: NEGATIVE
Trichomonas vaginosis: NEGATIVE

## 2020-06-07 NOTE — Telephone Encounter (Signed)
-----   Message from Karma Ganja, NP sent at 06/06/2020 11:16 AM EST ----- Please schedule Ultrasound for pelvic pressure. She has known fibroids

## 2020-06-07 NOTE — Telephone Encounter (Signed)
Spoke with pt. Pt needing PUS scheduled. Pt agreeable.  Pt scheduled with Dr Quincy Simmonds on 12/9 at 25 am for PUS and then consult to follow. Pt verbalized understanding to date and time of appt. Pt aware that PUS is transvaginal. Cancellation policy reviewed.   Cc: Hayley for precert  Orders placed by Claiborne Billings, NP Routing to Dr Quincy Simmonds and Claiborne Billings, NP for review Encounter closed

## 2020-06-10 ENCOUNTER — Other Ambulatory Visit: Payer: Self-pay

## 2020-06-10 ENCOUNTER — Telehealth: Payer: Self-pay

## 2020-06-10 DIAGNOSIS — D259 Leiomyoma of uterus, unspecified: Secondary | ICD-10-CM

## 2020-06-10 DIAGNOSIS — R102 Pelvic and perineal pain: Secondary | ICD-10-CM

## 2020-06-10 LAB — CYTOLOGY - PAP
Comment: NEGATIVE
Diagnosis: NEGATIVE
High risk HPV: NEGATIVE

## 2020-06-10 NOTE — Progress Notes (Signed)
PUS orders placed for pt per Claiborne Billings, NP Encounter closed

## 2020-06-10 NOTE — Addendum Note (Signed)
Addended by: Georgia Lopes on: 06/10/2020 11:46 AM   Modules accepted: Orders

## 2020-06-10 NOTE — Telephone Encounter (Signed)
Spoke with patient regarding benefits for scheduled Pelvic ultrasound. Patient acknowledges understanding of information presented. Encounter closed. 

## 2020-06-10 NOTE — Telephone Encounter (Signed)
Call to patient. Per DPR, OK to leave message on voicemail.   Left voicemail requesting a return call to [facility] to review benefits for scheduled Pelvic ultrasound with Brook A. Quincy Simmonds, MD, Cherlynn June

## 2020-06-13 ENCOUNTER — Other Ambulatory Visit: Payer: BC Managed Care – PPO

## 2020-06-13 ENCOUNTER — Other Ambulatory Visit: Payer: BC Managed Care – PPO | Admitting: Obstetrics and Gynecology

## 2020-06-13 ENCOUNTER — Telehealth: Payer: Self-pay

## 2020-06-13 DIAGNOSIS — R102 Pelvic and perineal pain: Secondary | ICD-10-CM

## 2020-06-13 DIAGNOSIS — D259 Leiomyoma of uterus, unspecified: Secondary | ICD-10-CM

## 2020-06-13 NOTE — Telephone Encounter (Signed)
Patient cancelled her appointment for ultrasound for today. She states she is not feeling well her sugar is low.

## 2020-06-13 NOTE — Telephone Encounter (Signed)
Thank you for the update.  Please reach out to the patient to reschedule.  She may also have her ultrasound done at [place] with a follow up appointment here if it is more convenient for her.

## 2020-06-14 NOTE — Telephone Encounter (Signed)
Left message for pt to return call to triage RN. 

## 2020-06-20 NOTE — Telephone Encounter (Signed)
Pt returned call. Call placed to pt. Pt given update and recommendations per Dr Quincy Simmonds for PUS. Pt verbalized understanding and agreeable to scheduling PUS here at our office. Pt scheduled for 07/11/2020 at 0930 am.  Pt advised of Cancellation policy and that PUS is transvaginal. Pt agreeable to date and time of appt.  Cc: Hayley for precert Orders placed  Routing to Dr Quincy Simmonds for update Encounter closed

## 2020-06-20 NOTE — Telephone Encounter (Signed)
Left message for pt to return call to triage RN. 

## 2020-06-24 ENCOUNTER — Other Ambulatory Visit: Payer: Self-pay | Admitting: Adult Health

## 2020-07-11 ENCOUNTER — Ambulatory Visit (INDEPENDENT_AMBULATORY_CARE_PROVIDER_SITE_OTHER): Payer: BC Managed Care – PPO | Admitting: Obstetrics and Gynecology

## 2020-07-11 ENCOUNTER — Encounter: Payer: Self-pay | Admitting: Obstetrics and Gynecology

## 2020-07-11 ENCOUNTER — Other Ambulatory Visit: Payer: Self-pay

## 2020-07-11 ENCOUNTER — Ambulatory Visit (INDEPENDENT_AMBULATORY_CARE_PROVIDER_SITE_OTHER): Payer: BC Managed Care – PPO

## 2020-07-11 VITALS — BP 122/82 | HR 74 | Ht 65.25 in | Wt 311.0 lb

## 2020-07-11 DIAGNOSIS — D219 Benign neoplasm of connective and other soft tissue, unspecified: Secondary | ICD-10-CM

## 2020-07-11 DIAGNOSIS — R102 Pelvic and perineal pain: Secondary | ICD-10-CM

## 2020-07-11 DIAGNOSIS — D259 Leiomyoma of uterus, unspecified: Secondary | ICD-10-CM | POA: Diagnosis not present

## 2020-07-11 DIAGNOSIS — N83201 Unspecified ovarian cyst, right side: Secondary | ICD-10-CM | POA: Diagnosis not present

## 2020-07-11 DIAGNOSIS — N921 Excessive and frequent menstruation with irregular cycle: Secondary | ICD-10-CM | POA: Diagnosis not present
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
## 2020-07-11 NOTE — Patient Instructions (Signed)
Leuprolide depot injection What is this medicine? LEUPROLIDE (loo PROE lide) is a man-made protein that acts like a natural hormone in the body. It decreases testosterone in men and decreases estrogen in women. In men, this medicine is used to treat advanced prostate cancer. In women, some forms of this medicine may be used to treat endometriosis, uterine fibroids, or other female hormone-related problems. This medicine may be used for other purposes; ask your health care provider or pharmacist if you have questions. COMMON BRAND NAME(S): Eligard, Fensolv, Lupron Depot, Lupron Depot-Ped, Viadur What should I tell my health care provider before I take this medicine? They need to know if you have any of these conditions:  diabetes  heart disease or previous heart attack  high blood pressure  high cholesterol  mental illness  osteoporosis  pain or difficulty passing urine  seizures  spinal cord metastasis  stroke  suicidal thoughts, plans, or attempt; a previous suicide attempt by you or a family member  tobacco smoker  unusual vaginal bleeding (women)  an unusual or allergic reaction to leuprolide, benzyl alcohol, other medicines, foods, dyes, or preservatives  pregnant or trying to get pregnant  breast-feeding How should I use this medicine? This medicine is for injection into a muscle or for injection under the skin. It is given by a health care professional in a hospital or clinic setting. The specific product will determine how it will be given to you. Make sure you understand which product you receive and how often you will receive it. Talk to your pediatrician regarding the use of this medicine in children. Special care may be needed. Overdosage: If you think you have taken too much of this medicine contact a poison control center or emergency room at once. NOTE: This medicine is only for you. Do not share this medicine with others. What if I miss a dose? It is  important not to miss a dose. Call your doctor or health care professional if you are unable to keep an appointment. Depot injections: Depot injections are given either once-monthly, every 12 weeks, every 16 weeks, or every 24 weeks depending on the product you are prescribed. The product you are prescribed will be based on if you are female or female, and your condition. Make sure you understand your product and dosing. What may interact with this medicine? Do not take this medicine with any of the following medications:  chasteberry This medicine may also interact with the following medications:  herbal or dietary supplements, like black cohosh or DHEA  female hormones, like estrogens or progestins and birth control pills, patches, rings, or injections  female hormones, like testosterone This list may not describe all possible interactions. Give your health care provider a list of all the medicines, herbs, non-prescription drugs, or dietary supplements you use. Also tell them if you smoke, drink alcohol, or use illegal drugs. Some items may interact with your medicine. What should I watch for while using this medicine? Visit your doctor or health care professional for regular checks on your progress. During the first weeks of treatment, your symptoms may get worse, but then will improve as you continue your treatment. You may get hot flashes, increased bone pain, increased difficulty passing urine, or an aggravation of nerve symptoms. Discuss these effects with your doctor or health care professional, some of them may improve with continued use of this medicine. Female patients may experience a menstrual cycle or spotting during the first months of therapy with this medicine.   If this continues, contact your doctor or health care professional. This medicine may increase blood sugar. Ask your healthcare provider if changes in diet or medicines are needed if you have diabetes. What side effects may I  notice from receiving this medicine? Side effects that you should report to your doctor or health care professional as soon as possible:  allergic reactions like skin rash, itching or hives, swelling of the face, lips, or tongue  breathing problems  chest pain  depression or memory disorders  pain in your legs or groin  pain at site where injected or implanted  seizures  severe headache  signs and symptoms of high blood sugar such as being more thirsty or hungry or having to urinate more than normal. You may also feel very tired or have blurry vision  swelling of the feet and legs  suicidal thoughts or other mood changes  visual changes  vomiting Side effects that usually do not require medical attention (report to your doctor or health care professional if they continue or are bothersome):  breast swelling or tenderness  decrease in sex drive or performance  diarrhea  hot flashes  loss of appetite  muscle, joint, or bone pains  nausea  redness or irritation at site where injected or implanted  skin problems or acne This list may not describe all possible side effects. Call your doctor for medical advice about side effects. You may report side effects to FDA at 1-800-FDA-1088. Where should I keep my medicine? This drug is given in a hospital or clinic and will not be stored at home. NOTE: This sheet is a summary. It may not cover all possible information. If you have questions about this medicine, talk to your doctor, pharmacist, or health care provider.  2020 Elsevier/Gold Standard (2018-04-21 09:27:03) Elagolix tablets What is this medicine? ELAGOLIX (el a GOE lix) is used to treat endometriosis in women. It reduces pain from the condition and may help reduce painful sexual intercourse. This medicine may be used for other purposes; ask your health care provider or pharmacist if you have questions. COMMON BRAND NAME(S): Freida Busman What should I tell my health  care provider before I take this medicine? They need to know if you have any of these conditions:  liver disease  mental illness  osteoporosis  suicidal thoughts, plans, or attempt; a previous suicide attempt by you or a family member  an unusual or allergic reaction to elagolix, other medicines, foods, dyes, or preservatives  pregnant or trying to get pregnant  breast-feeding How should I use this medicine? Take this medicine by mouth with a glass of water. You can take it with or without food. Follow the directions on the prescription label. Take this medicine at the same time each day. Do not take your medicine more often than directed. A special MedGuide will be given to you by the pharmacist with each prescription and refill. Be sure to read this information carefully each time. Talk to your pediatrician regarding the use of this medicine in children. This medicine is not approved for use in children. Overdosage: If you think you have taken too much of this medicine contact a poison control center or emergency room at once. NOTE: This medicine is only for you. Do not share this medicine with others. What if I miss a dose? If you miss a dose, take it as soon as you can. If it is almost time for your next dose, take only that dose. Do not take  double or extra doses. What may interact with this medicine? Do not take this medicine with any of the following medications:  cyclosporine  gemfibrozil This medicine may also interact with the following medications:  certain antiviral medicines for hepatitis, HIV or AIDS  citalopram  digoxin  female hormones, like estrogens or progestins and birth control pills, patches, rings, or injections  methadone  midazolam  omeprazole  rifampin  rosuvastatin This list may not describe all possible interactions. Give your health care provider a list of all the medicines, herbs, non-prescription drugs, or dietary supplements you use.  Also tell them if you smoke, drink alcohol, or use illegal drugs. Some items may interact with your medicine. What should I watch for while using this medicine? Visit your doctor or health care professional for regular checks on your progress. This medicine may cause weak bones (osteoporosis). Only use this product for the amount of time your health care professional tells you to. The longer you use this product the more likely you will be at risk for weak bones. Ask your health care professional how you can keep strong bones. You may have a change in bleeding pattern or irregular periods. Many females stop having periods while taking this drug. This medicine does not prevent pregnancy. Women must use effective birth control with this medicine. Use a non-hormonal form of birth control while taking this medicine and for 1 week after stopping it. Talk to your health care professional about how to prevent pregnancy. Do not become pregnant while taking this medicine. Women should inform their doctor if they wish to become pregnant or think they might be pregnant. There is a potential for serious side effects to an unborn child. Talk to your health care professional or pharmacist for more information. Patients and their families should watch out for new or worsening depression or thoughts of suicide. Also watch out for sudden changes in feelings such as feeling anxious, agitated, panicky, irritable, hostile, aggressive, impulsive, severely restless, overly excited and hyperactive, or not being able to sleep. If this happens, call your health care professional. What side effects may I notice from receiving this medicine? Side effects that you should report to your doctor or health care professional as soon as possible:  allergic reactions like skin rash, itching or hives, swelling of the face, lips, or tongue  anxious  depressed mood  signs and symptoms of liver injury like dark yellow or brown urine;  general ill feeling or flu-like symptoms; light-colored stools; loss of appetite; nausea; right upper belly pain; unusually weak or tired; yellowing of the eyes or skin  suicidal thoughts or other mood changes Side effects that usually do not require medical attention (report these to your doctor or health care professional if they continue or are bothersome):  reduced or absent menstrual periods  headache  hot flashes or night sweats  nausea  joint pain  trouble sleeping This list may not describe all possible side effects. Call your doctor for medical advice about side effects. You may report side effects to FDA at 1-800-FDA-1088. Where should I keep my medicine? Keep out of the reach of children. Store at room temperature between 2 and 30 degrees C (36 and 86 degrees F). Throw away any unused medicine after the expiration date on the label. Discard any unused medicine and used packaging carefully. Follow the directions in the [place]. Do NOT flush down the toilet. NOTE: This sheet is a summary. It may not cover all possible information. If  you have questions about this medicine, talk to your doctor, pharmacist, or health care provider.  2020 Elsevier/Gold Standard (2018-03-08 12:46:01) Uterine Artery Embolization for Fibroids  Uterine artery embolization is a procedure to shrink uterine fibroids. Uterine fibroids are masses of tissue (tumors) that can develop in the womb (uterus). They are also called leiomyomas. This type of tumor is not cancerous (benign) and does not spread to other parts of the body outside of the pelvic area. The pelvic area is the part of the body between the hip bones. You can have one or many fibroids. Fibroids can vary in size, shape, weight, and where they grow in the uterus. Some can become quite large. In this procedure, a thin plastic tube (catheter) is used to inject a chemical that blocks off the blood supply to the fibroid, which causes the fibroid to  shrink. Tell a health care provider about:  Any allergies you have.  All medicines you are taking, including vitamins, herbs, eye drops, creams, and over-the-counter medicines.  Any problems you or family members have had with anesthetic medicines.  Any blood disorders you have.  Any surgeries you have had.  Any medical conditions you have.  Whether you are pregnant or may be pregnant. What are the risks? Generally, this is a safe procedure. However, problems may occur, including:  Bleeding.  Allergic reactions to medicines or dyes.  Damage to other structures or organs.  Infection, including blood infection (septicemia).  Injury to the uterus from decreased blood supply.  Lack of menstrual periods (amenorrhea).  Death of tissue cells (necrosis) around your bladder or vulva.  Development of a hole between organs or from an organ to the surface of your skin (fistula).  Blood clot in the legs (deep vein thrombosis) or lung (pulmonary embolus).  Nausea and vomiting. What happens before the procedure? Staying hydrated Follow instructions from your health care provider about hydration, which may include:  Up to 2 hours before the procedure - you may continue to drink clear liquids, such as water, clear fruit juice, black coffee, and plain tea. Eating and drinking restrictions Follow instructions from your health care provider about eating and drinking, which may include:  8 hours before the procedure - stop eating heavy meals or foods such as meat, fried foods, or fatty foods.  6 hours before the procedure - stop eating light meals or foods, such as toast or cereal.  6 hours before the procedure - stop drinking milk or drinks that contain milk.  2 hours before the procedure - stop drinking clear liquids. Medicines  Ask your health care provider about: ? Changing or stopping your regular medicines. This is especially important if you are taking diabetes medicines or  blood thinners. ? Taking over-the-counter medicines, vitamins, herbs, and supplements. ? Taking medicines such as aspirin and ibuprofen. These medicines can thin your blood. Do not take these medicines unless your health care provider tells you to take them.  You may be given antibiotic medicine to help prevent infection.  You may be given medicine to prevent nausea and vomiting (antiemetic). General instructions  Ask your health care provider how your surgical site will be marked or identified.  You may be asked to shower with a germ-killing soap.  Plan to have someone take you home from the hospital or clinic.  If you will be going home right after the procedure, plan to have someone with you for 24 hours.  You will be asked to empty your bladder. What  happens during the procedure?  To lower your risk of infection: ? Your health care team will wash or sanitize their hands. ? Hair may be removed from the surgical area. ? Your skin will be washed with soap.  An IV will be inserted into one of your veins.  You will be given one or more of the following: ? A medicine to help you relax (sedative). ? A medicine to numb the area (local anesthetic).  A small cut (incision) will be made in your groin.  A catheter will be inserted into the main artery of your leg. The catheter will be guided through the artery to your uterus.  A series of images will be taken while dye is injected through the catheter in your groin. X-rays are taken at the same time. This is done to provide a road map of the blood supply to your uterus and fibroids.  Tiny plastic spheres, about the size of sand grains, will be injected through the catheter. Metal coils may be used to help block the artery. The particles will lodge in tiny branches of the uterine artery that supplies blood to the fibroids.  The procedure will be repeated on the artery that supplies the other side of the uterus.  The catheter will be  removed and pressure will be applied to stop the bleeding.  A dressing will be placed over the incision. The procedure may vary among health care providers and hospitals. What happens after the procedure?  Your blood pressure, heart rate, breathing rate, and blood oxygen level will be monitored until the medicines you were given have worn off.  You will be given pain medicine as needed.  You may be given medicine for nausea and vomiting as needed.  Do not drive for 24 hours if you were given a sedative. Summary  Uterine artery embolization is a procedure to shrink uterine fibroids by blocking the blood supply to the fibroid.  You may be given a sedative and local anesthetic for the procedure.  A catheter will be inserted into the main artery of your leg. The catheter will be guided through the artery to your uterus.  After the procedure you will be given pain medicine and medicine for nausea as needed.  Do not drive for 24 hours if you were given a sedative. This information is not intended to replace advice given to you by your health care provider. Make sure you discuss any questions you have with your health care provider. Document Revised: 06/04/2017 Document Reviewed: 09/24/2016 Elsevier Patient Education  2020 [place]. Total Laparoscopic Hysterectomy A total laparoscopic hysterectomy is a minimally invasive surgery to remove the uterus and cervix. The fallopian tubes and ovaries can also be removed (bilateral salpingo-oophorectomy) during this surgery, if necessary. This procedure may be done to treat problems such as:  Noncancerous growths in the uterus (uterine fibroids) that cause symptoms.  A condition that causes the lining of the uterus (endometrium) to grow in other areas (endometriosis).  Problems with pelvic support. This is caused by weakened muscles of the pelvis following vaginal childbirth or menopause.  Cancer of the cervix, ovaries, uterus, or  endometrium.  Excessive (dysfunctional) uterine bleeding. This surgery is performed by inserting a thin, lighted tube (laparoscope) and surgical instruments into small incisions in the abdomen. The laparoscope sends images to a monitor. The images help the health care provider perform the procedure. After this procedure, you will no longer be able to have a baby, and you will  no longer have a menstrual period. Tell a health care provider about:  Any allergies you have.  All medicines you are taking, including vitamins, herbs, eye drops, creams, and over-the-counter medicines.  Any problems you or family members have had with anesthetic medicines.  Any blood disorders you have.  Any surgeries you have had.  Any medical conditions you have.  Whether you are pregnant or may be pregnant. What are the risks? Generally, this is a safe procedure. However, problems may occur, including:  Infection.  Bleeding.  Blood clots in the legs or lungs.  Allergic reactions to medicines.  Damage to other structures or organs.  The risk that the surgery may have to be switched to the regular one in which a large incision is made in the abdomen (abdominal hysterectomy). What happens before the procedure? Staying hydrated Follow instructions from your health care provider about hydration, which may include:  Up to 2 hours before the procedure - you may continue to drink clear liquids, such as water, clear fruit juice, black coffee, and plain tea Eating and drinking restrictions Follow instructions from your health care provider about eating and drinking, which may include:  8 hours before the procedure - stop eating heavy meals or foods such as meat, fried foods, or fatty foods.  6 hours before the procedure - stop eating light meals or foods, such as toast or cereal.  6 hours before the procedure - stop drinking milk or drinks that contain milk.  2 hours before the procedure - stop drinking  clear liquids. Medicines  Ask your health care provider about: ? Changing or stopping your regular medicines. This is especially important if you are taking diabetes medicines or blood thinners. ? Taking over-the-counter medicines, vitamins, herbs, and supplements. ? Taking medicines such as aspirin and ibuprofen. These medicines can thin your blood. Do not take these medicines unless your health care provider tells you to take them.  You may be given antibiotic medicine to help prevent infection.  You may be asked to take laxatives.  You may be given medicines to help prevent nausea and vomiting after the procedure. General instructions  Ask your health care provider how your surgical site will be marked or identified.  You may be asked to shower with a germ-killing soap.  Do not use any products that contain nicotine or tobacco, such as cigarettes and e-cigarettes. If you need help quitting, ask your health care provider.  You may have an exam or testing, such as an ultrasound to determine the size and shape of your pelvic organs.  You may have a blood or urine sample taken.  This procedure can affect the way you feel about yourself. Talk with your health care provider about the physical and emotional changes hysterectomy may cause.  Plan to have someone take you home from the hospital or clinic.  Plan to have a responsible adult care for you for at least 24 hours after you leave the hospital or clinic. This is important. What happens during the procedure?  To lower your risk of infection: ? Your health care team will wash or sanitize their hands. ? Your skin will be washed with soap. ? Hair may be removed from the surgical area.  An IV will be inserted into one of your veins.  You will be given one or more of the following: ? A medicine to help you relax (sedative). ? A medicine to make you fall asleep (general anesthetic).  You will be  given antibiotic medicine through  your IV.  A tube may be inserted down your throat to help you breathe during the procedure.  A gas (carbon dioxide) will be used to inflate your abdomen to allow your surgeon to see inside of your abdomen.  Three or four small incisions will be made in your abdomen.  A laparoscope will be inserted into one of your incisions. Surgical instruments will be inserted through the other incisions in order to perform the procedure.  Your uterus and cervix may be removed through your vagina or cut into small pieces and removed through the small incisions. Any other organs that need to be removed will also be removed this way.  Carbon dioxide will be released from inside of your abdomen.  Your incisions will be closed with stitches (sutures).  A bandage (dressing) may be placed over your incisions. The procedure may vary among health care providers and hospitals. What happens after the procedure?  Your blood pressure, heart rate, breathing rate, and blood oxygen level will be monitored until the medicines you were given have worn off.  You will be given medicine for pain and nausea as needed.  Do not drive for 24 hours if you received a sedative. Summary  Total Laparoscopic hysterectomy is a procedure to remove your uterus, cervix and sometimes the fallopian tubes and ovaries.  This procedure can affect the way you feel about yourself. Talk with your health care provider about the physical and emotional changes hysterectomy may cause.  After this procedure, you will no longer be able to have a baby, and you will no longer have a menstrual period.  You will be given pain medicine to control discomfort after this procedure. This information is not intended to replace advice given to you by your health care provider. Make sure you discuss any questions you have with your health care provider. Document Revised: 06/04/2017 Document Reviewed: 09/02/2016 Elsevier Patient Education  2020 [place]. Ovarian Cyst     An ovarian cyst is a fluid-filled sac that forms on an ovary. The ovaries are small organs that produce eggs in women. Various types of cysts can form on the ovaries. Some may cause symptoms and require treatment. Most ovarian cysts go away on their own, are not cancerous (are benign), and do not cause problems. Common types of ovarian cysts include:  Functional (follicle) cysts. ? Occur during the menstrual cycle, and usually go away with the next menstrual cycle if you do not get pregnant. ? Usually cause no symptoms.  Endometriomas. ? Are cysts that form from the tissue that lines the uterus (endometrium). ? Are sometimes called "chocolate cysts" because they become filled with blood that turns brown. ? Can cause pain in the lower abdomen during intercourse and during your period.  Cystadenoma cysts. ? Develop from cells on the outside surface of the ovary. ? Can get very large and cause lower abdomen pain and pain with intercourse. ? Can cause severe pain if they twist or break open (rupture).  Dermoid cysts. ? Are sometimes found in both ovaries. ? May contain different kinds of body tissue, such as skin, teeth, hair, or cartilage. ? Usually do not cause symptoms unless they get very big.  Theca lutein cysts. ? Occur when too much of a certain hormone (human chorionic gonadotropin) is produced and overstimulates the ovaries to produce an egg. ? Are most common after having procedures used to assist with the conception of a baby (in vitro fertilization).  What are the causes? Ovarian cysts may be caused by:  Ovarian hyperstimulation syndrome. This is a condition that can develop from taking fertility medicines. It causes multiple large ovarian cysts to form.  Polycystic ovarian syndrome (PCOS). This is a common hormonal disorder that can cause ovarian cysts, as well as problems with your period or fertility. What increases the risk? The following factors  may make you more likely to develop ovarian cysts:  Being overweight or obese.  Taking fertility medicines.  Taking certain forms of hormonal birth control.  Smoking. What are the signs or symptoms? Many ovarian cysts do not cause symptoms. If symptoms are present, they may include:  Pelvic pain or pressure.  Pain in the lower abdomen.  Pain during sex.  Abdominal swelling.  Abnormal menstrual periods.  Increasing pain with menstrual periods. How is this diagnosed? These cysts are commonly found during a routine pelvic exam. You may have tests to find out more about the cyst, such as:  Ultrasound.  X-ray of the pelvis.  CT scan.  MRI.  Blood tests. How is this treated? Many ovarian cysts go away on their own without treatment. Your health care provider may want to check your cyst regularly for 2-3 months to see if it changes. If you are in menopause, it is especially important to have your cyst monitored closely because menopausal women have a higher rate of ovarian cancer. When treatment is needed, it may include:  Medicines to help relieve pain.  A procedure to drain the cyst (aspiration).  Surgery to remove the whole cyst.  Hormone treatment or birth control pills. These methods are sometimes used to help dissolve a cyst. Follow these instructions at home:  Take over-the-counter and prescription medicines only as told by your health care provider.  Do not drive or use heavy machinery while taking prescription pain medicine.  Get regular pelvic exams and Pap tests as often as told by your health care provider.  Return to your normal activities as told by your health care provider. Ask your health care provider what activities are safe for you.  Do not use any products that contain nicotine or tobacco, such as cigarettes and e-cigarettes. If you need help quitting, ask your health care provider.  Keep all follow-up visits as told by your health care  provider. This is important. Contact a health care provider if:  Your periods are late, irregular, or painful, or they stop.  You have pelvic pain that does not go away.  You have pressure on your bladder or trouble emptying your bladder completely.  You have pain during sex.  You have any of the following in your abdomen: ? A feeling of fullness. ? Pressure. ? Discomfort. ? Pain that does not go away. ? Swelling.  You feel generally ill.  You become constipated.  You lose your appetite.  You develop severe acne.  You start to have more body hair and facial hair.  You are gaining weight or losing weight without changing your exercise and eating habits.  You think you may be pregnant. Get help right away if:  You have abdominal pain that is severe or gets worse.  You cannot eat or drink without vomiting.  You suddenly develop a fever.  Your menstrual period is much heavier than usual. This information is not intended to replace advice given to you by your health care provider. Make sure you discuss any questions you have with your health care provider. Document Revised: 09/20/2017 Document  Reviewed: 11/24/2015 Elsevier Patient Education  [place]. Uterine Fibroids  Uterine fibroids (leiomyomas) are noncancerous (benign) tumors that can develop in the uterus. Fibroids may also develop in the fallopian tubes, cervix, or tissues (ligaments) near the uterus. You may have one or many fibroids. Fibroids vary in size, weight, and where they grow in the uterus. Some can become quite large. Most fibroids do not require medical treatment. What are the causes? The cause of this condition is not known. What increases the risk? You are more likely to develop this condition if you:  Are in your 30s or 40s and have not gone through menopause.  Have a family history of this condition.  Are of African-American descent.  Had your first period at an early age (early  menarche).  Have not had any children (nulliparity).  Are overweight or obese. What are the signs or symptoms? Many women do not have any symptoms. Symptoms of this condition may include:  Heavy menstrual bleeding.  Bleeding or spotting between periods.  Pain and pressure in the pelvic area, between the hips.  Bladder problems, such as needing to urinate urgently or more often than usual.  Inability to have children (infertility).  Failure to carry pregnancy to term (miscarriage). How is this diagnosed? This condition may be diagnosed based on:  Your symptoms and medical history.  A physical exam.  A pelvic exam that includes feeling for any tumors.  Imaging tests, such as ultrasound or MRI. How is this treated? Treatment for this condition may include:  Seeing your health care provider for follow-up visits to monitor your fibroids for any changes.  Taking NSAIDs such as ibuprofen, naproxen, or aspirin to reduce pain.  Hormone medicines. These may be taken as a pill, given in an injection, or delivered by a T-shaped device that is inserted into the uterus (intrauterine device, IUD).  Surgery to remove one of the following: ? The fibroids (myomectomy). Your health care provider may recommend this if fibroids affect your fertility and you want to become pregnant. ? The uterus (hysterectomy). ? Blood supply to the fibroids (uterine artery embolization). Follow these instructions at home:  Take over-the-counter and prescription medicines only as told by your health care provider.  Ask your health care provider if you should take iron pills or eat more iron-rich foods, such as dark green, leafy vegetables. Heavy menstrual bleeding can cause low iron levels.  If directed, apply heat to your back or abdomen to reduce pain. Use the heat source that your health care provider recommends, such as a moist heat pack or a heating pad. ? Place a towel between your skin and the heat  source. ? Leave the heat on for 20-30 minutes. ? Remove the heat if your skin turns bright red. This is especially important if you are unable to feel pain, heat, or cold. You may have a greater risk of getting burned.  Pay close attention to your menstrual cycle. Tell your health care provider about any changes, such as: ? Increased blood flow that requires you to use more pads or tampons than usual. ? A change in the number of days that your period lasts. ? A change in symptoms that are associated with your period, such as back pain or cramps in your abdomen.  Keep all follow-up visits as told by your health care provider. This is important, especially if your fibroids need to be monitored for any changes. Contact a health care provider if you:  Have  pelvic pain, back pain, or cramps in your abdomen that do not get better with medicine or heat.  Develop new bleeding between periods.  Have increased bleeding during or between periods.  Feel unusually tired or weak.  Feel light-headed. Get help right away if you:  Faint.  Have pelvic pain that suddenly gets worse.  Have severe vaginal bleeding that soaks a tampon or pad in 30 minutes or less. Summary  Uterine fibroids are noncancerous (benign) tumors that can develop in the uterus.  The exact cause of this condition is not known.  Most fibroids do not require medical treatment unless they affect your ability to have children (fertility).  Contact a health care provider if you have pelvic pain, back pain, or cramps in your abdomen that do not get better with medicines.  Make sure you know what symptoms should cause you to get help right away. This information is not intended to replace advice given to you by your health care provider. Make sure you discuss any questions you have with your health care provider. Document Revised: 06/04/2017 Document Reviewed: 05/18/2017 Elsevier Patient Education  2020 [employer].

## 2020-07-25 ENCOUNTER — Ambulatory Visit: Payer: BC Managed Care – PPO | Admitting: Adult Health

## 2020-07-29 ENCOUNTER — Encounter: Payer: Self-pay | Admitting: Obstetrics and Gynecology

## 2020-08-07 ENCOUNTER — Other Ambulatory Visit: Payer: Self-pay

## 2020-08-07 ENCOUNTER — Ambulatory Visit
Admission: RE | Admit: 2020-08-07 | Discharge: 2020-08-07 | Disposition: A | Discharge status: Home / Self Care | Payer: BC Managed Care – PPO | Source: Ambulatory Visit | Attending: Adult Health | Admitting: Adult Health
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
## 2020-08-08 ENCOUNTER — Encounter: Payer: Self-pay | Admitting: Obstetrics and Gynecology

## 2020-08-08 ENCOUNTER — Other Ambulatory Visit (HOSPITAL_COMMUNITY)
Admission: RE | Admit: 2020-08-08 | Discharge: 2020-08-08 | Disposition: A | Discharge status: Home / Self Care | Payer: BC Managed Care – PPO | Source: Ambulatory Visit | Attending: Obstetrics and Gynecology | Admitting: Obstetrics and Gynecology

## 2020-08-08 ENCOUNTER — Ambulatory Visit: Payer: BC Managed Care – PPO | Admitting: Obstetrics and Gynecology

## 2020-08-08 VITALS — BP 132/74 | HR 88 | Ht 65.25 in | Wt 311.0 lb

## 2020-08-08 DIAGNOSIS — N921 Excessive and frequent menstruation with irregular cycle: Secondary | ICD-10-CM

## 2020-08-08 DIAGNOSIS — D649 Anemia, unspecified: Secondary | ICD-10-CM | POA: Diagnosis not present

## 2020-08-08 DIAGNOSIS — D219 Benign neoplasm of connective and other soft tissue, unspecified: Secondary | ICD-10-CM

## 2020-08-08 LAB — CBC
HCT: 41.1 % (ref 35.0–45.0)
Hemoglobin: 13.8 g/dL (ref 11.7–15.5)
MCH: 31.3 pg (ref 27.0–33.0)
MCHC: 33.6 g/dL (ref 32.0–36.0)
MCV: 93.2 fL (ref 80.0–100.0)
MPV: 10.1 fL (ref 7.5–12.5)
Platelets: 313 10*3/uL (ref 140–400)
RBC: 4.41 10*6/uL (ref 3.80–5.10)
RDW: 13.1 % (ref 11.0–15.0)
WBC: 5.7 10*3/uL (ref 3.8–10.8)

## 2020-08-08 NOTE — Patient Instructions (Signed)
Endometrial Biopsy  An endometrial biopsy is a procedure to remove tissue samples from the endometrium, which is the lining of the uterus. The tissue that is removed can then be checked under a microscope for disease. This procedure is used to diagnose conditions such as endometrial cancer, endometrial tuberculosis, polyps, or other inflammatory conditions. This procedure may also be used to investigate uterine bleeding to determine where you are in your menstrual cycle or how your hormone levels are affecting the lining of the uterus. Tell a health care provider about:  Any allergies you have.  All medicines you are taking, including vitamins, herbs, eye drops, creams, and over-the-counter medicines.  Any problems you or family members have had with anesthetic medicines.  Any blood disorders you have.  Any surgeries you have had.  Any medical conditions you have.  Whether you are pregnant or may be pregnant. What are the risks? Generally, this is a safe procedure. However, problems may occur, including:  Bleeding.  Pelvic infection.  Puncture of the wall of the uterus with the biopsy device (rare).  Allergic reactions to medicines. What happens before the procedure?  Keep a record of your menstrual cycles as told by your health care provider. You may need to schedule your procedure for a specific time in your cycle.  You may want to bring a sanitary pad to wear after the procedure.  Plan to have someone take you home from the hospital or clinic.  Ask your health care provider about: ? Changing or stopping your regular medicines. This is especially important if you are taking diabetes medicines, arthritis medicines, or blood thinners. ? Taking medicines such as aspirin and ibuprofen. These medicines can thin your blood. Do not take these medicines unless your health care provider tells you to take them. ? Taking over-the-counter medicines, vitamins, herbs, and  supplements. What happens during the procedure?  You will lie on an exam table with your feet and legs supported as in a pelvic exam.  Your health care provider will insert an instrument (speculum) into your vagina to see your cervix.  Your cervix will be cleansed with an antiseptic solution.  A medicine (local anesthetic) will be used to numb the cervix.  A forceps instrument (tenaculum) will be used to hold your cervix steady for the biopsy.  A thin, rod-like instrument (uterine sound) will be inserted through your cervix to determine the length of your uterus and the location where the biopsy sample will be removed.  A thin, flexible tube (catheter) will be inserted through your cervix and into the uterus. The catheter will be used to collect the biopsy sample from your endometrial tissue.  The catheter and speculum will then be removed, and the tissue sample will be sent to a lab for examination. The procedure may vary among health care providers and hospitals. What can I expect after procedure?  You will rest in a recovery area until you are ready to go home.  You may have mild cramping and a small amount of vaginal bleeding. This is normal.  You may have a small amount of vaginal bleeding for a few days. This is normal.  It is up to you to get the results of your procedure. Ask your health care provider, or the department that is doing the procedure, when your results will be ready. Follow these instructions at home:  Take over-the-counter and prescription medicines only as told by your health care provider.  Do not douche, use tampons, or have   sexual intercourse until your health care provider approves.  Return to your normal activities as told by your health care provider. Ask your health care provider what activities are safe for you.  Follow instructions from your health care provider about any activity restrictions, such as restrictions on strenuous exercise or heavy  lifting.  Keep all follow-up visits. This is important. Contact a health care provider:  You have heavy bleeding, or bleed for longer than 2 days after the procedure.  You have bad smelling discharge from your vagina.  You have a fever or chills.  You have a burning sensation when urinating or you have difficulty urinating.  You have severe pain in your lower abdomen. Get help right away if you:  You have severe cramps in your stomach or back.  You pass large blood clots.  Your bleeding increases.  You become weak or light-headed, or you faint or lose consciousness. Summary  An endometrial biopsy is a procedure to remove tissue samples is taken from the endometrium, which is the lining of the uterus.  The tissue sample that is removed will be checked under a microscope for disease.  This procedure is used to diagnose conditions such as endometrial cancer, endometrial tuberculosis, polyps, or other inflammatory conditions.  After the procedure, it is common to have mild cramping and a small amount of vaginal bleeding for a few days.  Do not douche, use tampons, or have sexual intercourse until your health care provider approves. Ask your health care provider which activities are safe for you. This information is not intended to replace advice given to you by your health care provider. Make sure you discuss any questions you have with your health care provider. Document Revised: 01/15/2020 Document Reviewed: 01/15/2020 Elsevier Patient Education  2021 [employer]  

## 2020-08-12 ENCOUNTER — Telehealth: Payer: Self-pay | Admitting: Obstetrics and Gynecology

## 2020-08-12 LAB — SURGICAL PATHOLOGY

## 2020-08-12 NOTE — Telephone Encounter (Signed)
Please precert and schedule surgery for my patient.   She will have a total laparoscopic hysterectomy with bilateral salpingectomy, possible bilateral oophorectomy, cystoscopy, vaginal morcellation of uterine specimen in an Alexis bag.   She has fibroids and menorrhagia with irregular menses.   She is interested in surgery at the end of March.

## 2020-08-14 NOTE — Telephone Encounter (Signed)
Call to patient. Per DPR, OK to leave message on voicemail.   Left voicemail requesting a return call to Kaliel Bolds to review benefits for recommended surgery with Brook Silva, MD, FACOG. 

## 2020-08-16 NOTE — Telephone Encounter (Signed)
Spoke with patient. Reviewed potential surgery dates for [facility] OR.  Patient will be in [facility], declines 09/17/20 or 09/23/20.  Patient request to proceed with scheduling on 10/21/20.  Advised patient I will return call once surgery date confirmed.  Patient agreeable.

## 2020-08-16 NOTE — Telephone Encounter (Signed)
Patient needs to schedule bx.

## 2020-08-16 NOTE — Telephone Encounter (Signed)
Spoke with patient regarding surgery benefits. Patient acknowledges understanding of information presented. Patient is aware that benefits presented are professional benefits only. Patient is aware that once surgery is scheduled, the hospital will call with separate benefits. See account note.  Routing to Jill Hamm, RN, for surgery scheduling. 

## 2020-08-19 ENCOUNTER — Ambulatory Visit (INDEPENDENT_AMBULATORY_CARE_PROVIDER_SITE_OTHER): Payer: BC Managed Care – PPO | Admitting: Adult Health

## 2020-08-19 ENCOUNTER — Encounter: Payer: Self-pay | Admitting: Adult Health

## 2020-08-19 ENCOUNTER — Other Ambulatory Visit: Payer: Self-pay

## 2020-08-19 VITALS — BP 120/82 | HR 81 | Temp 97.5°F | Wt 309.0 lb

## 2020-08-19 DIAGNOSIS — E538 Deficiency of other specified B group vitamins: Secondary | ICD-10-CM

## 2020-08-19 DIAGNOSIS — R5383 Other fatigue: Secondary | ICD-10-CM

## 2020-08-19 DIAGNOSIS — I10 Essential (primary) hypertension: Secondary | ICD-10-CM | POA: Diagnosis not present

## 2020-08-19 DIAGNOSIS — E559 Vitamin D deficiency, unspecified: Secondary | ICD-10-CM

## 2020-08-19 DIAGNOSIS — E782 Mixed hyperlipidemia: Secondary | ICD-10-CM

## 2020-08-19 DIAGNOSIS — Z79899 Other long term (current) drug therapy: Secondary | ICD-10-CM

## 2020-08-19 MED ORDER — LOSARTAN POTASSIUM-HCTZ 100-25 MG PO TABS: ORAL_TABLET | ORAL | 1 refills | Status: DC

## 2020-08-19 NOTE — Telephone Encounter (Signed)
See 08/12/20 telephone encounter.   Encounter closed.
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
## 2020-08-19 NOTE — Patient Instructions (Addendum)
Goals    . HEMOGLOBIN A1C < 5.7    . LDL CALC < 100    . Weight (lb) < 290 lb (131.5 kg)       0.25 mg weekly x 2 weeks, then 0.5 mg weekly for 3-4 weeks, then if tolerating well will try 1 mg     Managing Stress, Adult Feeling a certain amount of stress is normal. Stress helps our body and mind get ready to deal with the demands of life. Stress hormones can motivate you to do well at work and meet your responsibilities. However severe or long-lasting (chronic) stress can affect your mental and physical health. Chronic stress puts you at higher risk for anxiety, depression, and other health problems like digestive problems, muscle aches, heart disease, high blood pressure, and stroke. What are the causes? Common causes of stress include:  Demands from work, such as deadlines, feeling overworked, or having long hours.  Pressures at home, such as money issues, disagreements with a spouse, or parenting issues.  Pressures from major life changes, such as divorce, moving, loss of a loved one, or chronic illness. You may be at higher risk for stress-related problems if you do not get enough sleep, are in poor health, do not have emotional support, or have a mental health disorder like anxiety or depression. How to recognize stress Stress can make you:  Have trouble sleeping.  Feel sad, anxious, irritable, or overwhelmed.  Lose your appetite.  Overeat or want to eat unhealthy foods.  Want to use drugs or alcohol. Stress can also cause physical symptoms, such as:  Sore, tense muscles, especially in the shoulders and neck.  Headaches.  Trouble breathing.  A faster heart rate.  Stomach pain, nausea, or vomiting.  Diarrhea or constipation.  Trouble concentrating. Follow these instructions at home: Lifestyle  Identify the source of your stress and your reaction to it. See a therapist who can help you change your reactions.  When there are stressful events: ? Talk about it  with family, friends, or co-workers. ? Try to think realistically about stressful events and not ignore them or overreact. ? Try to find the positives in a stressful situation and not focus on the negatives. ? Cut back on responsibilities at work and home, if possible. Ask for help from friends or family members if you need it.  Find ways to cope with stress, such as: ? Meditation. ? Deep breathing. ? Yoga or tai chi. ? Progressive muscle relaxation. ? Doing art, playing music, or reading. ? Making time for fun activities. ? Spending time with family and friends.  Get support from family, friends, or spiritual resources. Eating and drinking  Eat a healthy diet. This includes: ? Eating foods that are high in fiber, such as beans, whole grains, and fresh fruits and vegetables. ? Limiting foods that are high in fat and processed sugars, such as fried and sweet foods.  Do not skip meals or overeat.  Drink enough fluid to keep your urine pale yellow. Alcohol use  Do not drink alcohol if: ? Your health care provider tells you not to drink. ? You are pregnant, may be pregnant, or are planning to become pregnant.  Drinking alcohol is a way some people try to ease their stress. This can be dangerous, so if you drink alcohol: ? Limit how much you use to:  0-1 drink a day for women.  0-2 drinks a day for men. ? Be aware of how much alcohol is  in your drink. In the [place] one drink equals one 12 oz bottle of beer (355 mL), one 5 oz glass of wine (148 mL), or one 1 oz glass of hard liquor (44 mL). Activity  Include 30 minutes of exercise in your daily schedule. Exercise is a good stress reducer.  Include time in your day for an activity that you find relaxing. Try taking a walk, going on a bike ride, reading a book, or listening to music.  Schedule your time in a way that lowers stress, and keep a consistent schedule. Prioritize what is most important to get done.   General  instructions  Get enough sleep. Try to go to sleep and get up at about the same time every day.  Take over-the-counter and prescription medicines only as told by your health care provider.  Do not use any products that contain nicotine or tobacco, such as cigarettes, e-cigarettes, and chewing tobacco. If you need help quitting, ask your health care provider.  Do not use drugs or smoke to cope with stress.  Keep all follow-up visits as told by your health care provider. This is important. Where to find support  Talk with your health care provider about stress management or finding a support group.  Find a therapist to work with you on your stress management techniques. Contact a health care provider if:  Your stress symptoms get worse.  You are unable to manage your stress at home.  You are struggling to stop using drugs or alcohol. Get help right away if:  You may be a danger to yourself or others.  You have any thoughts of death or suicide. If you ever feel like you may hurt yourself or others, or have thoughts about taking your own life, get help right away. You can go to your nearest emergency department or call:  Your local emergency services (911 in the [place]  A suicide crisis helpline, such as the [place] at (220)379-0199. This is open 24 hours a day. Summary  Feeling a certain amount of stress is normal, but severe or long-lasting (chronic) stress can affect your mental and physical health.  Chronic stress can put you at higher risk for anxiety, depression, and other health problems like digestive problems, muscle aches, heart disease, high blood pressure, and stroke.  You may be at higher risk for stress-related problems if you do not get enough sleep, are in poor health, lack emotional support, or have a mental health disorder like anxiety or depression.  Identify the source of your stress and your reaction to it. Try talking about  stressful events with family, friends, or co-workers, finding a coping method, or getting support from spiritual resources.  If you need more help, talk with your health care provider about finding a support group or a mental health therapist. This information is not intended to replace advice given to you by your health care provider. Make sure you discuss any questions you have with your health care provider. Document Revised: 01/18/2019 Document Reviewed: 01/18/2019 Elsevier Patient Education  [place].

## 2020-08-20 LAB — VITAMIN D 25 HYDROXY (VIT D DEFICIENCY, FRACTURES): Vit D, 25-Hydroxy: 71 ng/mL (ref 30–100)

## 2020-08-20 LAB — LIPID PANEL
Cholesterol: 199 mg/dL (ref <200)
HDL: 46 mg/dL — ABNORMAL LOW (ref >=50)
LDL Cholesterol (Calc): 120 mg/dL (calc) — ABNORMAL HIGH
Non-HDL Cholesterol (Calc): 153 mg/dL (calc) — ABNORMAL HIGH (ref <130)
Total CHOL/HDL Ratio: 4.3 (calc) (ref <5.0)
Triglycerides: 209 mg/dL — ABNORMAL HIGH (ref <150)

## 2020-08-20 LAB — MAGNESIUM: Magnesium: 2 mg/dL (ref 1.5–2.5)

## 2020-08-20 LAB — COMPLETE METABOLIC PANEL WITH GFR
AG Ratio: 1.5 (calc) (ref 1.0–2.5)
ALT: 10 U/L (ref 6–29)
AST: 18 U/L (ref 10–35)
Albumin: 4.1 g/dL (ref 3.6–5.1)
Alkaline phosphatase (APISO): 52 U/L (ref 37–153)
BUN: 13 mg/dL (ref 7–25)
CO2: 29 mmol/L (ref 20–32)
Calcium: 9.7 mg/dL (ref 8.6–10.4)
Chloride: 103 mmol/L (ref 98–110)
Creat: 0.82 mg/dL (ref 0.50–1.05)
GFR, Est African American: 97 mL/min/{1.73_m2} (ref >=60)
GFR, Est Non African American: 83 mL/min/{1.73_m2} (ref >=60)
Globulin: 2.8 g/dL (calc) (ref 1.9–3.7)
Glucose, Bld: 87 mg/dL (ref 65–99)
Potassium: 4.3 mmol/L (ref 3.5–5.3)
Sodium: 140 mmol/L (ref 135–146)
Total Bilirubin: 0.5 mg/dL (ref 0.2–1.2)
Total Protein: 6.9 g/dL (ref 6.1–8.1)

## 2020-08-20 LAB — VITAMIN B12: Vitamin B-12: >2000 pg/mL — ABNORMAL HIGH (ref 200–1100)

## 2020-08-20 LAB — TSH: TSH: 2.48 mIU/L

## 2020-08-21 NOTE — Telephone Encounter (Signed)
Spoke with patient. Surgery date request confirmed.  Advised surgery is scheduled for 10/21/20 at [place], [facility].  Surgery instruction sheet and hospital brochure reviewed, printed copy will be mailed to patient at address on file.  Patient advised if Covid screening and quarantine requirements and agreeable.   Routing to provider. Encounter closed.

## 2020-08-31 ENCOUNTER — Other Ambulatory Visit: Payer: Self-pay | Admitting: Adult Health

## 2020-08-31 DIAGNOSIS — R7309 Other abnormal glucose: Secondary | ICD-10-CM

## 2020-08-31 DIAGNOSIS — R7301 Impaired fasting glucose: Secondary | ICD-10-CM

## 2020-09-01 ENCOUNTER — Encounter: Payer: Self-pay | Admitting: Internal Medicine

## 2020-09-01 DIAGNOSIS — R7303 Prediabetes: Secondary | ICD-10-CM | POA: Insufficient documentation

## 2020-09-01 HISTORY — DX: Prediabetes: R73.03

## 2020-09-03 ENCOUNTER — Encounter: Payer: Self-pay | Admitting: Obstetrics and Gynecology

## 2020-09-05 ENCOUNTER — Other Ambulatory Visit: Payer: Self-pay | Admitting: Adult Health

## 2020-09-05 MED ORDER — PHENTERMINE HCL 37.5 MG PO CAPS: ORAL_CAPSULE | ORAL | 1 refills | Status: DC

## 2020-09-06 ENCOUNTER — Other Ambulatory Visit: Payer: Self-pay | Admitting: Adult Health

## 2020-09-06 DIAGNOSIS — R7301 Impaired fasting glucose: Secondary | ICD-10-CM

## 2020-09-06 DIAGNOSIS — R7309 Other abnormal glucose: Secondary | ICD-10-CM

## 2020-09-06 MED ORDER — OZEMPIC (1 MG/DOSE) 4 MG/3ML ~~LOC~~ SOPN: 1.0000 mg | PEN_INJECTOR | SUBCUTANEOUS | 1 refills | Status: DC

## 2020-10-01 ENCOUNTER — Encounter: Payer: Self-pay | Admitting: Obstetrics and Gynecology

## 2020-10-01 ENCOUNTER — Other Ambulatory Visit: Payer: Self-pay

## 2020-10-01 ENCOUNTER — Ambulatory Visit: Payer: BC Managed Care – PPO | Admitting: Obstetrics and Gynecology

## 2020-10-01 VITALS — BP 122/80 | Ht 65.25 in | Wt 308.0 lb

## 2020-10-01 DIAGNOSIS — D259 Leiomyoma of uterus, unspecified: Secondary | ICD-10-CM
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
## 2020-10-07 ENCOUNTER — Encounter: Payer: Self-pay | Admitting: Obstetrics and Gynecology

## 2020-10-15 ENCOUNTER — Encounter (HOSPITAL_COMMUNITY): Payer: Self-pay

## 2020-10-15 NOTE — Progress Notes (Addendum)
Surgical Instructions    Your procedure is scheduled on 10/21/20.  Report to [facility] Main Entrance "A" at 05:30 A.M., then check in with the Admitting office.  Call this number if you have problems the morning of surgery:  [phone]   If you have any questions prior to your surgery date call [phone]: Open Monday-Friday 8am-4pm    Remember:  Do not eat after midnight the night before your surgery  You may drink clear liquids until 04:30am the morning of your surgery.   Clear liquids allowed are: Water, Non-Citrus Juices (without pulp), Carbonated Beverages, Clear Tea, Black Coffee Only, and Gatorade    Take these medicines the morning of surgery with A SIP OF WATER  ALPRAZolam (XANAX)  If needed cetirizine (ZYRTEC)    As of today, STOP taking any Aspirin (unless otherwise instructed by your surgeon) Aleve, Naproxen, Ibuprofen, Motrin, Advil, Goody's, BC's, all herbal medications, fish oil, and all vitamins.   HOW TO MANAGE YOUR DIABETES BEFORE AND AFTER SURGERY  Why is it important to control my blood sugar before and after surgery? . Improving blood sugar levels before and after surgery helps healing and can limit problems. . A way of improving blood sugar control is eating a healthy diet by: o  Eating less sugar and carbohydrates o  Increasing activity/exercise o  Talking with your doctor about reaching your blood sugar goals . High blood sugars (greater than 180 mg/dL) can raise your risk of infections and slow your recovery, so you will need to focus on controlling your diabetes during the weeks before surgery. . Make sure that the doctor who takes care of your diabetes knows about your planned surgery including the date and location.  How do I manage my blood sugar before surgery? . Check your blood sugar at least 4 times a day, starting 2 days before surgery, to make sure that the level is not too high or low. o Check your blood sugar the morning of your surgery  when you wake up and every 2 hours until you get to the Short Stay unit. . If your blood sugar is less than 70 mg/dL, you will need to treat for low blood sugar: o Do not take insulin. o Treat a low blood sugar (less than 70 mg/dL) with  cup of clear juice (cranberry or apple), 4 glucose tablets, OR glucose gel. Recheck blood sugar in 15 minutes after treatment (to make sure it is greater than 70 mg/dL). If your blood sugar is not greater than 70 mg/dL on recheck, call [phone] o  for further instructions. . Report your blood sugar to the short stay nurse when you get to Short Stay.  . If you are admitted to the hospital after surgery: o Your blood sugar will be checked by the staff and you will probably be given insulin after surgery (instead of oral diabetes medicines) to make sure you have good blood sugar levels. o The goal for blood sugar control after surgery is 80-180 mg/dL.     WHAT DO I DO ABOUT MY DIABETES MEDICATION?   Marland Kitchen Do not take oral diabetes medicines (pills):  the morning of surgery.  DO NOT TAKE Semaglutide THE DAY OF SURGERY.   . The day of surgery, do not take other diabetes injectables, including Byetta (exenatide), Bydureon (exenatide ER), Victoza (liraglutide), or Trulicity (dulaglutide).                      Do not wear  jewelry, make up, or nail polish            Do not wear lotions, powders, perfumes/colognes, or deodorant.            Do not shave 48 hours prior to surgery.              Do not bring valuables to the hospital.            [facility] is not responsible for any belongings or valuables.  Do NOT Smoke (Tobacco/Vaping) or drink Alcohol 24 hours prior to your procedure If you use a CPAP at night, you may bring all equipment for your overnight stay.   Contacts, glasses, dentures or bridgework may not be worn into surgery, please bring cases for these belongings   For patients admitted to the hospital, discharge time will be determined by your  treatment team.   Patients discharged the day of surgery will not be allowed to drive home, and someone needs to stay with them for 24 hours.    Special instructions:   [facility]- Preparing For Surgery  Before surgery, you can play an important role. Because skin is not sterile, your skin needs to be as free of germs as possible. You can reduce the number of germs on your skin by washing with CHG (chlorahexidine gluconate) Soap before surgery.  CHG is an antiseptic cleaner which kills germs and bonds with the skin to continue killing germs even after washing.    Oral Hygiene is also important to reduce your risk of infection.  Remember - BRUSH YOUR TEETH THE MORNING OF SURGERY WITH YOUR REGULAR TOOTHPASTE  Please do not use if you have an allergy to CHG or antibacterial soaps. If your skin becomes reddened/irritated stop using the CHG.  Do not shave (including legs and underarms) for at least 48 hours prior to first CHG shower. It is OK to shave your face.  Please follow these instructions carefully.   1. Shower the NIGHT BEFORE SURGERY and the MORNING OF SURGERY  2. If you chose to wash your hair, wash your hair first as usual with your normal shampoo.  3. After you shampoo, rinse your hair and body thoroughly to remove the shampoo.  4. Wash Face and genitals (private parts) with your normal soap.   5.  Shower the NIGHT BEFORE SURGERY and the MORNING OF SURGERY with CHG Soap.   6. Use CHG Soap as you would any other liquid soap. You can apply CHG directly to the skin and wash gently with a scrungie or a clean washcloth.   7. Apply the CHG Soap to your body ONLY FROM THE NECK DOWN.  Do not use on open wounds or open sores. Avoid contact with your eyes, ears, mouth and genitals (private parts). Wash Face and genitals (private parts)  with your normal soap.   8. Wash thoroughly, paying special attention to the area where your surgery will be performed.  9. Thoroughly rinse your body  with warm water from the neck down.  10. DO NOT shower/wash with your normal soap after using and rinsing off the CHG Soap.  11. Pat yourself dry with a CLEAN TOWEL.  12. Wear CLEAN PAJAMAS to bed the night before surgery  13. Place CLEAN SHEETS on your bed the night before your surgery  14. DO NOT SLEEP WITH PETS.   Day of Surgery: Take a shower with CHG soap. Wear Clean/Comfortable clothing the morning of surgery Do not apply  any deodorants/lotions.   Remember to brush your teeth WITH YOUR REGULAR TOOTHPASTE.   Please read over the following fact sheets that you were given.

## 2020-10-16 ENCOUNTER — Encounter (HOSPITAL_COMMUNITY)
Admission: RE | Admit: 2020-10-16 | Discharge: 2020-10-16 | Disposition: A | Discharge status: Home / Self Care | Payer: BC Managed Care – PPO | Source: Ambulatory Visit | Attending: Obstetrics and Gynecology | Admitting: Obstetrics and Gynecology

## 2020-10-16 ENCOUNTER — Other Ambulatory Visit: Payer: Self-pay

## 2020-10-16 ENCOUNTER — Encounter (HOSPITAL_COMMUNITY): Payer: Self-pay

## 2020-10-16 DIAGNOSIS — Z87828 Personal history of other (healed) physical injury and trauma: Secondary | ICD-10-CM | POA: Diagnosis not present

## 2020-10-16 DIAGNOSIS — D259 Leiomyoma of uterus, unspecified: Secondary | ICD-10-CM | POA: Insufficient documentation

## 2020-10-16 DIAGNOSIS — Z6841 Body Mass Index (BMI) 40.0 and over, adult: Secondary | ICD-10-CM | POA: Diagnosis not present

## 2020-10-16 DIAGNOSIS — Z87891 Personal history of nicotine dependence: Secondary | ICD-10-CM | POA: Diagnosis not present

## 2020-10-16 DIAGNOSIS — N921 Excessive and frequent menstruation with irregular cycle: Secondary | ICD-10-CM | POA: Diagnosis not present

## 2020-10-16 DIAGNOSIS — Z7984 Long term (current) use of oral hypoglycemic drugs: Secondary | ICD-10-CM | POA: Diagnosis not present

## 2020-10-16 DIAGNOSIS — I10 Essential (primary) hypertension: Secondary | ICD-10-CM | POA: Diagnosis not present

## 2020-10-16 DIAGNOSIS — K5732 Diverticulitis of large intestine without perforation or abscess without bleeding: Secondary | ICD-10-CM | POA: Insufficient documentation

## 2020-10-16 DIAGNOSIS — Z79899 Other long term (current) drug therapy: Secondary | ICD-10-CM | POA: Diagnosis not present

## 2020-10-16 DIAGNOSIS — D649 Anemia, unspecified: Secondary | ICD-10-CM | POA: Diagnosis not present

## 2020-10-16 DIAGNOSIS — Z01812 Encounter for preprocedural laboratory examination: Secondary | ICD-10-CM | POA: Insufficient documentation

## 2020-10-16 LAB — BASIC METABOLIC PANEL
Anion gap: 8 (ref 5–15)
BUN: 10 mg/dL (ref 6–20)
CO2: 28 mmol/L (ref 22–32)
Calcium: 9.7 mg/dL (ref 8.9–10.3)
Chloride: 102 mmol/L (ref 98–111)
Creatinine, Ser: 0.77 mg/dL (ref 0.44–1.00)
GFR, Estimated: >60 mL/min (ref >60)
Glucose, Bld: 90 mg/dL (ref 70–99)
Potassium: 3.8 mmol/L (ref 3.5–5.1)
Sodium: 138 mmol/L (ref 135–145)

## 2020-10-16 LAB — CBC
HCT: 43 % (ref 36.0–46.0)
Hemoglobin: 13.8 g/dL (ref 12.0–15.0)
MCH: 30.7 pg (ref 26.0–34.0)
MCHC: 32.1 g/dL (ref 30.0–36.0)
MCV: 95.6 fL (ref 80.0–100.0)
Platelets: 298 10*3/uL (ref 150–400)
RBC: 4.5 MIL/uL (ref 3.87–5.11)
RDW: 13.2 % (ref 11.5–15.5)
WBC: 7.2 10*3/uL (ref 4.0–10.5)
nRBC: 0 % (ref 0.0–0.2)

## 2020-10-16 LAB — TYPE AND SCREEN
ABO/RH(D): AB NEG
Antibody Screen: NEGATIVE
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
## 2020-10-17 ENCOUNTER — Other Ambulatory Visit (HOSPITAL_COMMUNITY)
Admission: RE | Admit: 2020-10-17 | Discharge: 2020-10-17 | Disposition: A | Discharge status: Home / Self Care | Payer: BC Managed Care – PPO | Source: Ambulatory Visit | Attending: Obstetrics and Gynecology | Admitting: Obstetrics and Gynecology

## 2020-10-17 DIAGNOSIS — Z20822 Contact with and (suspected) exposure to covid-19: Secondary | ICD-10-CM | POA: Diagnosis not present

## 2020-10-17 DIAGNOSIS — Z01812 Encounter for preprocedural laboratory examination: Secondary | ICD-10-CM | POA: Diagnosis present

## 2020-10-17 LAB — SARS CORONAVIRUS 2 (TAT 6-24 HRS): SARS Coronavirus 2: NEGATIVE

## 2020-10-17 NOTE — Anesthesia Preprocedure Evaluation (Addendum)
Anesthesia Evaluation  Patient identified by MRN, date of birth, ID band Patient awake    Reviewed: Allergy & Precautions, NPO status , Patient's Chart, lab work & pertinent test results  History of Anesthesia Complications (+) AWARENESS UNDER ANESTHESIA and history of anesthetic complications  Airway Mallampati: III  TM Distance: >3 FB Neck ROM: Full    Dental  (+) Dental Advisory Given   Pulmonary former smoker,    breath sounds clear to auscultation       Cardiovascular hypertension, Pt. on medications  Rhythm:Regular Rate:Normal     Neuro/Psych negative neurological ROS     GI/Hepatic negative GI ROS, Neg liver ROS,   Endo/Other  Morbid obesity  Renal/GU negative Renal ROS     Musculoskeletal   Abdominal   Peds  Hematology  (+) anemia ,   Anesthesia Other Findings   Reproductive/Obstetrics                           Lab Results  Component Value Date   WBC 7.2 10/16/2020   HGB 13.8 10/16/2020   HCT 43.0 10/16/2020   MCV 95.6 10/16/2020   PLT 298 10/16/2020   Lab Results  Component Value Date   CREATININE 0.77 10/16/2020   BUN 10 10/16/2020   NA 138 10/16/2020   K 3.8 10/16/2020   CL 102 10/16/2020   CO2 28 10/16/2020    Anesthesia Physical Anesthesia Plan  ASA: III  Anesthesia Plan: General   Post-op Pain Management:    Induction: Intravenous  PONV Risk Score and Plan: 4 or greater and Scopolamine patch - Pre-op, Midazolam, Dexamethasone, Ondansetron and Treatment may vary due to age or medical condition  Airway Management Planned: Oral ETT  Additional Equipment: None  Intra-op Plan:   Post-operative Plan: Extubation in OR  Informed Consent: I have reviewed the patients History and Physical, chart, labs and discussed the procedure including the risks, benefits and alternatives for the proposed anesthesia with the patient or authorized representative who has  indicated his/her understanding and acceptance.     Dental advisory given  Plan Discussed with: CRNA  Anesthesia Plan Comments: ( )      Anesthesia Quick Evaluation
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
## 2020-10-19 ENCOUNTER — Encounter: Payer: Self-pay | Admitting: Obstetrics and Gynecology
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
## 2020-10-21 ENCOUNTER — Observation Stay (HOSPITAL_COMMUNITY): Payer: BC Managed Care – PPO | Admitting: Certified Registered"

## 2020-10-21 ENCOUNTER — Observation Stay (HOSPITAL_COMMUNITY)
Admission: RE | Admit: 2020-10-21 | Discharge: 2020-10-21 | Disposition: A | Discharge status: Home / Self Care | Payer: BC Managed Care – PPO | Attending: Obstetrics and Gynecology | Admitting: Obstetrics and Gynecology

## 2020-10-21 ENCOUNTER — Observation Stay (HOSPITAL_COMMUNITY): Payer: BC Managed Care – PPO | Admitting: Vascular Surgery

## 2020-10-21 ENCOUNTER — Encounter (HOSPITAL_COMMUNITY): Payer: Self-pay | Admitting: Obstetrics and Gynecology

## 2020-10-21 ENCOUNTER — Other Ambulatory Visit: Payer: Self-pay

## 2020-10-21 ENCOUNTER — Encounter (HOSPITAL_COMMUNITY)
Admission: RE | Disposition: A | Discharge status: Home / Self Care | Payer: Self-pay | Source: Home / Self Care | Attending: Obstetrics and Gynecology

## 2020-10-21 DIAGNOSIS — Z79899 Other long term (current) drug therapy: Secondary | ICD-10-CM | POA: Diagnosis not present

## 2020-10-21 DIAGNOSIS — D259 Leiomyoma of uterus, unspecified: Principal | ICD-10-CM | POA: Insufficient documentation

## 2020-10-21 DIAGNOSIS — N840 Polyp of corpus uteri: Secondary | ICD-10-CM | POA: Diagnosis not present

## 2020-10-21 DIAGNOSIS — R7303 Prediabetes: Secondary | ICD-10-CM | POA: Diagnosis not present

## 2020-10-21 DIAGNOSIS — Z87891 Personal history of nicotine dependence: Secondary | ICD-10-CM | POA: Insufficient documentation

## 2020-10-21 DIAGNOSIS — N841 Polyp of cervix uteri: Secondary | ICD-10-CM | POA: Insufficient documentation

## 2020-10-21 DIAGNOSIS — N736 Female pelvic peritoneal adhesions (postinfective): Secondary | ICD-10-CM | POA: Diagnosis not present

## 2020-10-21 DIAGNOSIS — I10 Essential (primary) hypertension: Secondary | ICD-10-CM | POA: Diagnosis not present

## 2020-10-21 DIAGNOSIS — Z90710 Acquired absence of both cervix and uterus: Secondary | ICD-10-CM

## 2020-10-21 DIAGNOSIS — N921 Excessive and frequent menstruation with irregular cycle: Secondary | ICD-10-CM | POA: Diagnosis not present

## 2020-10-21 DIAGNOSIS — D219 Benign neoplasm of connective and other soft tissue, unspecified: Secondary | ICD-10-CM | POA: Diagnosis not present

## 2020-10-21 HISTORY — PX: TOTAL LAPAROSCOPIC HYSTERECTOMY WITH SALPINGECTOMY: SHX6742

## 2020-10-21 HISTORY — DX: Acquired absence of both cervix and uterus: Z90.710

## 2020-10-21 HISTORY — PX: CYSTOSCOPY: SHX5120

## 2020-10-21 LAB — CBC
HCT: 40.3 % (ref 36.0–46.0)
Hemoglobin: 13.3 g/dL (ref 12.0–15.0)
MCH: 31 pg (ref 26.0–34.0)
MCHC: 33 g/dL (ref 30.0–36.0)
MCV: 93.9 fL (ref 80.0–100.0)
Platelets: 278 10*3/uL (ref 150–400)
RBC: 4.29 MIL/uL (ref 3.87–5.11)
RDW: 13.2 % (ref 11.5–15.5)
WBC: 10 10*3/uL (ref 4.0–10.5)
nRBC: 0 % (ref 0.0–0.2)

## 2020-10-21 LAB — POCT PREGNANCY, URINE: Preg Test, Ur: NEGATIVE

## 2020-10-21 LAB — ABO/RH: ABO/RH(D): AB NEG

## 2020-10-21 SURGERY — HYSTERECTOMY, TOTAL, LAPAROSCOPIC, WITH SALPINGECTOMY
Anesthesia: General

## 2020-10-21 MED ORDER — LACTATED RINGERS IV SOLN: INTRAVENOUS | Status: DC

## 2020-10-21 MED ORDER — ORAL CARE MOUTH RINSE: 15.0000 mL | Freq: Once | OROMUCOSAL | Status: AC

## 2020-10-21 MED ORDER — ONDANSETRON HCL 4 MG/2ML IJ SOLN: 4.0000 mg | Freq: Four times a day (QID) | INTRAMUSCULAR | Status: DC | PRN

## 2020-10-21 MED ORDER — PROPOFOL 10 MG/ML IV BOLUS
INTRAVENOUS | Status: DC | PRN
  Administered 2020-10-21: 200 mg via INTRAVENOUS

## 2020-10-21 MED ORDER — PROPOFOL 10 MG/ML IV BOLUS
INTRAVENOUS | Status: AC
  Filled 2020-10-21: qty 20

## 2020-10-21 MED ORDER — MIDAZOLAM HCL 5 MG/5ML IJ SOLN
INTRAMUSCULAR | Status: DC | PRN
  Administered 2020-10-21: 2 mg via INTRAVENOUS

## 2020-10-21 MED ORDER — ROCURONIUM BROMIDE 10 MG/ML (PF) SYRINGE
PREFILLED_SYRINGE | INTRAVENOUS | Status: DC | PRN
  Administered 2020-10-21 (×2): 50 mg via INTRAVENOUS

## 2020-10-21 MED ORDER — OXYCODONE-ACETAMINOPHEN 5-325 MG PO TABS: 1.0000 | ORAL_TABLET | ORAL | 0 refills | Status: DC | PRN

## 2020-10-21 MED ORDER — SUGAMMADEX SODIUM 200 MG/2ML IV SOLN
INTRAVENOUS | Status: DC | PRN
  Administered 2020-10-21: 300 mg via INTRAVENOUS

## 2020-10-21 MED ORDER — BUPIVACAINE HCL (PF) 0.25 % IJ SOLN
INTRAMUSCULAR | Status: AC
  Filled 2020-10-21: qty 30

## 2020-10-21 MED ORDER — SODIUM CHLORIDE 0.9% FLUSH
INTRAVENOUS | Status: DC | PRN
  Administered 2020-10-21: 30 mL

## 2020-10-21 MED ORDER — DEXAMETHASONE SODIUM PHOSPHATE 10 MG/ML IJ SOLN
INTRAMUSCULAR | Status: DC | PRN
  Administered 2020-10-21: 10 mg via INTRAVENOUS

## 2020-10-21 MED ORDER — ENOXAPARIN SODIUM 40 MG/0.4ML ~~LOC~~ SOLN
40.0000 mg | SUBCUTANEOUS | Status: AC
  Administered 2020-10-21: 40 mg via SUBCUTANEOUS
  Filled 2020-10-21: qty 0.4

## 2020-10-21 MED ORDER — OXYCODONE-ACETAMINOPHEN 5-325 MG PO TABS
1.0000 | ORAL_TABLET | ORAL | Status: DC | PRN
  Administered 2020-10-21 (×3): 1 via ORAL
  Filled 2020-10-21 (×2): qty 1

## 2020-10-21 MED ORDER — MIDAZOLAM HCL 2 MG/2ML IJ SOLN
INTRAMUSCULAR | Status: AC
  Filled 2020-10-21: qty 2

## 2020-10-21 MED ORDER — POVIDONE-IODINE 10 % EX SWAB
2.0000 "application " | Freq: Once | CUTANEOUS | Status: AC
  Administered 2020-10-21: 2 via TOPICAL

## 2020-10-21 MED ORDER — MENTHOL 3 MG MT LOZG: 1.0000 | LOZENGE | OROMUCOSAL | Status: DC | PRN

## 2020-10-21 MED ORDER — FENTANYL CITRATE (PF) 250 MCG/5ML IJ SOLN
INTRAMUSCULAR | Status: AC
  Filled 2020-10-21: qty 5

## 2020-10-21 MED ORDER — PHENYLEPHRINE HCL-NACL 10-0.9 MG/250ML-% IV SOLN
INTRAVENOUS | Status: DC | PRN
  Administered 2020-10-21: 20 ug/min via INTRAVENOUS

## 2020-10-21 MED ORDER — FENTANYL CITRATE (PF) 250 MCG/5ML IJ SOLN
INTRAMUSCULAR | Status: DC | PRN
  Administered 2020-10-21 (×5): 50 ug via INTRAVENOUS

## 2020-10-21 MED ORDER — ACETAMINOPHEN 500 MG PO TABS
1000.0000 mg | ORAL_TABLET | ORAL | Status: AC
  Administered 2020-10-21: 1000 mg via ORAL
  Filled 2020-10-21: qty 2

## 2020-10-21 MED ORDER — LIDOCAINE 2% (20 MG/ML) 5 ML SYRINGE
INTRAMUSCULAR | Status: DC | PRN
  Administered 2020-10-21: 60 mg via INTRAVENOUS

## 2020-10-21 MED ORDER — ONDANSETRON HCL 4 MG/2ML IJ SOLN
INTRAMUSCULAR | Status: DC | PRN
  Administered 2020-10-21: 4 mg via INTRAVENOUS

## 2020-10-21 MED ORDER — AMISULPRIDE (ANTIEMETIC) 5 MG/2ML IV SOLN: 10.0000 mg | Freq: Once | INTRAVENOUS | Status: DC | PRN

## 2020-10-21 MED ORDER — SODIUM CHLORIDE 0.9 % IV SOLN
2.0000 g | INTRAVENOUS | Status: AC
  Administered 2020-10-21: 2 g via INTRAVENOUS
  Filled 2020-10-21: qty 2

## 2020-10-21 MED ORDER — HYDROMORPHONE HCL 1 MG/ML IJ SOLN: 0.2000 mg | INTRAMUSCULAR | Status: DC | PRN

## 2020-10-21 MED ORDER — CHLORHEXIDINE GLUCONATE 0.12 % MT SOLN
15.0000 mL | Freq: Once | OROMUCOSAL | Status: AC
  Administered 2020-10-21: 15 mL via OROMUCOSAL
  Filled 2020-10-21: qty 15

## 2020-10-21 MED ORDER — SODIUM CHLORIDE (PF) 0.9 % IJ SOLN
INTRAMUSCULAR | Status: AC
  Filled 2020-10-21: qty 50

## 2020-10-21 MED ORDER — FENTANYL CITRATE (PF) 100 MCG/2ML IJ SOLN: 25.0000 ug | INTRAMUSCULAR | Status: DC | PRN

## 2020-10-21 MED ORDER — KETOROLAC TROMETHAMINE 30 MG/ML IJ SOLN
30.0000 mg | Freq: Four times a day (QID) | INTRAMUSCULAR | Status: DC
  Administered 2020-10-21: 30 mg via INTRAVENOUS
  Filled 2020-10-21: qty 1

## 2020-10-21 MED ORDER — IBUPROFEN 800 MG PO TABS: 800.0000 mg | ORAL_TABLET | Freq: Three times a day (TID) | ORAL | 1 refills | Status: DC | PRN

## 2020-10-21 MED ORDER — BUPIVACAINE HCL (PF) 0.25 % IJ SOLN
INTRAMUSCULAR | Status: DC | PRN
  Administered 2020-10-21: 11 mL

## 2020-10-21 MED ORDER — SODIUM CHLORIDE 0.9 % IV SOLN
INTRAVENOUS | Status: DC | PRN
  Administered 2020-10-21: 30 mL

## 2020-10-21 MED ORDER — ONDANSETRON HCL 4 MG PO TABS: 4.0000 mg | ORAL_TABLET | Freq: Four times a day (QID) | ORAL | Status: DC | PRN

## 2020-10-21 MED ORDER — SODIUM CHLORIDE 0.9 % IR SOLN
Status: DC | PRN
  Administered 2020-10-21: 1000 mL

## 2020-10-21 MED ORDER — KETOROLAC TROMETHAMINE 30 MG/ML IJ SOLN
INTRAMUSCULAR | Status: AC
  Filled 2020-10-21: qty 1

## 2020-10-21 MED ORDER — ROPIVACAINE HCL 5 MG/ML IJ SOLN
INTRAMUSCULAR | Status: AC
  Filled 2020-10-21: qty 30

## 2020-10-21 MED ORDER — LACTATED RINGERS IV SOLN: INTRAVENOUS | Status: DC | PRN

## 2020-10-21 MED ORDER — OXYCODONE-ACETAMINOPHEN 5-325 MG PO TABS
ORAL_TABLET | ORAL | Status: AC
  Filled 2020-10-21: qty 1

## 2020-10-21 MED ORDER — SCOPOLAMINE 1 MG/3DAYS TD PT72: 1.0000 | MEDICATED_PATCH | TRANSDERMAL | Status: DC

## 2020-10-21 SURGICAL SUPPLY — 64 items
APPLICATOR ARISTA FLEXITIP XL (MISCELLANEOUS) IMPLANT
BARRIER ADHS 3X4 INTERCEED (GAUZE/BANDAGES/DRESSINGS) IMPLANT
BLADE SURG 10 STRL SS (BLADE) IMPLANT
CABLE HIGH FREQUENCY MONO STRZ (ELECTRODE) IMPLANT
CANISTER SUCT 3000ML PPV (MISCELLANEOUS) ×2 IMPLANT
CELL SAVER LIPIGURD (MISCELLANEOUS) ×1 IMPLANT
COVER BACK TABLE 60X90IN (DRAPES) IMPLANT
COVER MAYO STAND STRL (DRAPES) ×2 IMPLANT
DECANTER SPIKE VIAL GLASS SM (MISCELLANEOUS) ×4 IMPLANT
DERMABOND ADHESIVE PROPEN (GAUZE/BANDAGES/DRESSINGS) ×1
DERMABOND ADVANCED (GAUZE/BANDAGES/DRESSINGS) ×1
DERMABOND ADVANCED .7 DNX12 (GAUZE/BANDAGES/DRESSINGS) ×1 IMPLANT
DERMABOND ADVANCED .7 DNX6 (GAUZE/BANDAGES/DRESSINGS) ×1 IMPLANT
DURAPREP 26ML APPLICATOR (WOUND CARE) ×2 IMPLANT
EXTRT SYSTEM ALEXIS 14CM (MISCELLANEOUS) ×2
EXTRT SYSTEM ALEXIS 17CM (MISCELLANEOUS)
GLOVE BIO SURGEON STRL SZ 6.5 (GLOVE) ×8 IMPLANT
GLOVE SURG UNDER POLY LF SZ7 (GLOVE) ×6 IMPLANT
GOWN STRL REUS W/ TWL LRG LVL3 (GOWN DISPOSABLE) ×4 IMPLANT
GOWN STRL REUS W/TWL LRG LVL3 (GOWN DISPOSABLE) ×4
HARMONIC RUM II 2.5CM SILVER (DISPOSABLE) ×2
HEMOSTAT ARISTA ABSORB 3G PWDR (HEMOSTASIS) IMPLANT
HIBICLENS CHG 4% 4OZ BTL (MISCELLANEOUS) ×2 IMPLANT
KIT TURNOVER KIT B (KITS) ×2 IMPLANT
LIGASURE VESSEL 5MM BLUNT TIP (ELECTROSURGICAL) ×4 IMPLANT
NEEDLE INSUFFLATION 14GA 120MM (NEEDLE) ×2 IMPLANT
OCCLUDER COLPOPNEUMO (BALLOONS) ×2 IMPLANT
PACK LAPAROSCOPY BASIN (CUSTOM PROCEDURE TRAY) ×2 IMPLANT
PACK TRENDGUARD 450 HYBRID PRO (MISCELLANEOUS) IMPLANT
POUCH LAPAROSCOPIC INSTRUMENT (MISCELLANEOUS) ×2 IMPLANT
PROTECTOR NERVE ULNAR (MISCELLANEOUS) ×4 IMPLANT
SCALPEL HRMNC RUM II 2.5 SILVR (DISPOSABLE) ×1 IMPLANT
SCISSORS LAP 5X35 DISP (ENDOMECHANICALS) IMPLANT
SET CYSTO W/LG BORE CLAMP LF (SET/KITS/TRAYS/PACK) ×2 IMPLANT
SET IRRIG TUBING LAPAROSCOPIC (IRRIGATION / IRRIGATOR) ×2 IMPLANT
SET TRI-LUMEN FLTR TB AIRSEAL (TUBING) ×2 IMPLANT
SET TUBE SMOKE EVAC HIGH FLOW (TUBING) ×2 IMPLANT
SHEARS HARMONIC ACE PLUS 36CM (ENDOMECHANICALS) ×2 IMPLANT
SLEEVE ADV FIXATION 5X100MM (TROCAR) ×2 IMPLANT
SUT VIC AB 0 CT1 27 (SUTURE) ×3
SUT VIC AB 0 CT1 27XBRD ANBCTR (SUTURE) ×3 IMPLANT
SUT VICRYL 0 UR6 27IN ABS (SUTURE) IMPLANT
SUT VICRYL 4-0 PS2 18IN ABS (SUTURE) ×2 IMPLANT
SUT VLOC 180 0 9IN  GS21 (SUTURE) ×1
SUT VLOC 180 0 9IN GS21 (SUTURE) ×1 IMPLANT
SYR 10ML LL (SYRINGE) ×2 IMPLANT
SYR 50ML LL SCALE MARK (SYRINGE) ×4 IMPLANT
SYSTEM CARTER THOMASON II (TROCAR) IMPLANT
SYSTEM CONTND EXTRCTN KII BLLN (MISCELLANEOUS) IMPLANT
TIP RUMI ORANGE 6.7MMX12CM (TIP) IMPLANT
TIP UTERINE 5.1X6CM LAV DISP (MISCELLANEOUS) IMPLANT
TIP UTERINE 6.7X10CM GRN DISP (MISCELLANEOUS) ×2 IMPLANT
TIP UTERINE 6.7X6CM WHT DISP (MISCELLANEOUS) IMPLANT
TIP UTERINE 6.7X8CM BLUE DISP (MISCELLANEOUS) IMPLANT
TOWEL GREEN STERILE FF (TOWEL DISPOSABLE) ×4 IMPLANT
TRAY FOLEY W/BAG SLVR 14FR (SET/KITS/TRAYS/PACK) ×2 IMPLANT
TRENDGUARD 450 HYBRID PRO PACK (MISCELLANEOUS)
TROCAR ADV FIXATION 5X100MM (TROCAR) ×2 IMPLANT
TROCAR PORT AIRSEAL 5X120 (TROCAR) ×2 IMPLANT
TROCAR PORT AIRSEAL 8X120 (TROCAR) ×2 IMPLANT
TROCAR XCEL NON BLADE 8MM B8LT (ENDOMECHANICALS) ×2 IMPLANT
TROCAR XCEL NON-BLD 5MMX100MML (ENDOMECHANICALS) ×2 IMPLANT
UNDERPAD 30X36 HEAVY ABSORB (UNDERPADS AND DIAPERS) ×2 IMPLANT
WARMER LAPAROSCOPE (MISCELLANEOUS) ×2 IMPLANT

## 2020-10-21 NOTE — Progress Notes (Signed)
Patient discharged home, questions asked and answered.  To  Car via wheelchair escorted by [place], NT

## 2020-10-21 NOTE — Op Note (Addendum)
OPERATIVE REPORT   PREOPERATIVE DIAGNOSIS:   Uterine fibroid, right ovarian cyst   POSTOPERATIVE DIAGNOSIS:   Uterine fibroid, right ovarian cyst, pelvic adhesions   PROCEDURES:  Total laparoscopic hysterectomy with bilateral salpingectomy, right oophorectomy, lysis of adhesions, vaginal morcellation of uterus in [place] bag, cystoscopy   SURGEON:  Lenard Galloway, M.D.   ASSISTANT:  Dorothy Spark, M.D.   ANESTHESIA:  General endotracheal, intraperitoneal ropivicaine 30 mL diluted in 30 mL of normal saline, local with 0.25% Marcaine.   IVF:  1100 cc LR.   ESTIMATED BLOOD LOSS:  10 cc.   URINE OUTPUT:   200 cc.   COMPLICATIONS:  None.   INDICATIONS FOR THE PROCEDURE:      The patient is a 50 year old G73P0 African American female who presents with heavy and prolonged menstruation, pelvic pressure, and urinary urgency.  Pelvic ultrasound confirms the presence of a 6.44 cm fibroid displacing the endometrial cavity.  The right ovary contains a 1.75 cm cyst suspected to be a dermoid.  Endometrial biopsy shows inactive endometrium with no hyperplasia or malignancy.  The patient declines future childbearing and is requesting definitive treatment of her uterine fibroid and bleeding.  She requests hysterectomy procedure.   A plan is made to proceed with a total laparoscopic hysterectomy with bilateral salpingectomy, right oophorectomy, possible left oophorectomy, vaginal morcellation of the uterus in an Alexis bag, and cystoscopy after risks, benefits, and alternatives are reviewed.   FINDINGS:      Exam under anesthesia reveals a 12 week size uterus with fibroid just above the level of the cervix.  No adnexal masses were noted.   Laparoscopy revealed a globally enlarged uterus, normal tubes, a normal left ovary, and a right ovary which was adherent to the right pelvic side wall.  The upper abdomen was normal and demonstrated a normal liver.  The appendix was not well visualized. There was no  endometriosis seen in the abdomen or pelvis.     Cystoscopy at the termination of the procedure showed the bladder to be normal throughout 360 degrees including the bladder dome and trigone. There was no evidence of any foreign body or lesions in the bladder or the urethra. Both of the ureters were noted to be patent bilaterally.   SPECIMENS:      The uterus, cervix, bilateral tubes, and right ovary were went to pathology.  The specimen weight was 337.5 grams.    DESCRIPTION OF PROCEDURE:     The patient was reidentified in the preoperative hold area.   She did receive Cefotetan  for antibiotic prophylaxis.  She received Lovenox, TED hose, and PAS stockings for DVT prophylaxis.   In the operating room, the patient was placed in the dorsal lithotomy position on the operating room table.   Her legs were placed in the Rotan stirrups and her arms were both tucked at her sides. The Trendguard was used to support her shoulders.  The patient received general endotracheal anesthesia. The abdomen and vagina were then sterilely prepped and she was sterilely draped.   A speculum was placed in the vagina and a single-tooth tenaculum was placed on the anterior cervical lip.  A figure-of-eight suture of 0 Vicryl was placed on each the anterior and the posterior cervical lips. The uterus was sounded to 10 cm.  The cervix was then dilated with [facility] dilators.  A 10 RUMI tip with a KOH ring was then placed through the cervix and into the uterine cavity without difficulty.  The remaining vaginal instruments were then removed.  A Foley catheter was placed inside the bladder.   Attention was turned to the abdomen where the umbilical region was injected with 0.25% Marcaine and a small incision created.  A Veress needle was then used to insufflate the abdomen with CO2 gas after a saline drop test was performed and the fluid flowed freely.  A 5 mm umbilical incision was created with a scalpel after the skin.  A 5 mm  camera port was then placed using the Optiview.  An 8 mm incisions was created in the left mid abdomen and a 5 mm incision was created in the left lower abdomen after the skin was injected locally with 0.25% Marcaine.  Respective sized trocars were then placed under visualization of the laparoscope.  A 5 mm Airseal trocar was placed in the right mid quadrant after injecting with Marcaine and incising with a scalpel.    Ropivicine 30 cc diluted in 30 cc of normal saline was placed inside the peritoneal cavity.    The patient was placed in Trendelenburg position.  An inspection of the abdomen and pelvis was performed. The findings are as noted above.    The bilateral ureters were identified.  The left fallopian tube was grasped and the Ligasure was used to cauterize and cut through the mesosalpinx to the level of the uterus.  The fallopian tube was removed from the peritoneal cavity and later sent to pathology.  The left utero-ovarian ligament was similarly cauterized and cut with the Ligasure.  The left round ligament was then cauterized and divided with same instrument.  Dissection was performed to the anterior and posterior leaves of the broad ligaments using the Harmonic scalpel.  The incision was carried across the anterior cul-de-sac along the vesicouterine fold and the bladder was dissected away from the cervix. The peritoneum was taken down posteriorly.  The left uterine artery was skeletonized using the Harmonic scalpel.  It was cauterized and cut with the Ligasure instrument.   The same procedure that was performed on the left side was repeated on the right side with respect to the isolation, cautery, and transection of the proximal fallopian tube, utero-ovarian ligament, round ligament, broad ligament, bladder flap dissection, and right uterine artery.    The KOH ring was nicely visible.  The colpotomy incision was performed with the Harmonic scalpel in a circumferential fashion.  The Alexis bag  was placed in the peritoneal cavity through the vaginal, and the uterus was placed inside the bag.  The Alexis bag was brought into the vagina, and a weight speculum was placed in the bag. The specimen was then morcellated into pieces with a scalpel.  The uterus, cervix, left fallopian tube were later sent to pathology after the right tube and ovary were removed.   The balloon occluder was placed in the vagina.  The right tube and ovary were removed.  Adhesions of the right ovary to the right pelvic side wall were lysed with the Harmonic scalpel.  The right infundibulopelvic ligament was cauterized and transected with the Ligasure after the right ureter was re-identified.  The specimen was removed from the peritoneal cavity through the vagina.    The balloon was replaced in the vagina.  The vaginal cuff was sutured using a running suture of 0 V-Loc.  The vagina was closed from the patient's right hand side to the left hand side and then back 2 sutures towards the midline.  This provided good full-thickness closure  of the vaginal cuff.  The laparoscopic needle for suturing was removed from the peritoneal cavity.   The pelvis was irrigated and suctioned.   The pneumoperitoneal was let down.  There was good hemostasis of the operative sites and pedicles.     The CO2 pneumoperitoneum was released.  The patient received manual breaths to remove any remaining CO2 gas and all trocars were removed.   The vaginal occluder balloon was removed from the vagina.    The patient's Foley catheter was removed and cystoscopy was performed and the findings are as noted above.  The Foley catheter was left out.    Final inspection of the vagina demonstrated good hemostasis of the vaginal cuff.   The right and left trocar sites were closed with subcuticular sutures of 4/0 Vicryl.  Dermabond was placed over all of the incisions.   This concluded the patient's procedure.  She was extubated and escorted to the recovery  room in stable and awake condition.  There were no complications to the procedure.  All needle, instrument, and sponge counts were correct.  An MD assistant was necessary for tissue and uterine manipulation, retraction, and uterine morcellation due to the complexity of the case and uterine size due to the fibroids.   Lenard Galloway, M.D.

## 2020-10-21 NOTE — Progress Notes (Signed)
Day of Surgery Procedure(s) (LRB): TOTAL LAPAROSCOPIC HYSTERECTOMY WITH  BILATERAL SALPINGECTOMY, RIGHT OOPHERECTOMY; LYSIS OF ADHESIONS; VAGINAL MORSELLATION (N/A) CYSTOSCOPY (N/A)  Subjective: Patient reports tolerating PO and no problems voiding.   Good pain control with Percocet.  Wants discharge to home.  Using incentive spirometry, up to 2000 per patient.   Objective: I have reviewed patient's vital signs, intake and output and labs. Vitals:   10/21/20 1330 10/21/20 1637  BP: 135/78 124/70  Pulse: 85 100  Resp:  16  Temp: 98.8 F (37.1 C) 98.4 F (36.9 C)  SpO2: 98% 100%   1300 cc/210 cc UO + voids  CBC    Component Value Date/Time   WBC 10.0 10/21/2020 1503   RBC 4.29 10/21/2020 1503   HGB 13.3 10/21/2020 1503   HCT 40.3 10/21/2020 1503   PLT 278 10/21/2020 1503   MCV 93.9 10/21/2020 1503   MCH 31.0 10/21/2020 1503   MCHC 33.0 10/21/2020 1503   RDW 13.2 10/21/2020 1503   LYMPHSABS 2,744 01/15/2020 1507   EOSABS 62 01/15/2020 1507   BASOSABS 28 01/15/2020 1507     General: alert and cooperative Resp: clear to auscultation bilaterally Cardio: regular rate and rhythm, S1, S2 normal, no murmur, click, rub or gallop GI: soft, non-tender; bowel sounds normal; no masses,  no organomegaly and incision: clean, dry and intact Extremities: Ted hose and PAS on.  DPs 2+ bilaterally. Vaginal Bleeding: none  Assessment: s/p Procedure(s): TOTAL LAPAROSCOPIC HYSTERECTOMY WITH  BILATERAL SALPINGECTOMY, RIGHT OOPHERECTOMY; LYSIS OF ADHESIONS; VAGINAL MORSELLATION (N/A) CYSTOSCOPY (N/A): progressing well  Plan: Encourage ambulation Discontinue IV fluids Increase oral hydration.   Will discharge to home.  Rx for Percocet and Motrin.  FU in 1 week.  Instructions and precautions given. Surgical findings and procedure discussed with patient.    LOS: 0 days    Arloa Koh 10/21/2020, 6:19 PM

## 2020-10-21 NOTE — Anesthesia Postprocedure Evaluation (Signed)
Anesthesia Post Note  Patient: Mary Banks  Procedure(s) Performed: TOTAL LAPAROSCOPIC HYSTERECTOMY WITH  BILATERAL SALPINGECTOMY, RIGHT OOPHERECTOMY; LYSIS OF ADHESIONS; VAGINAL MORSELLATION (N/A ) CYSTOSCOPY (N/A )     Patient location during evaluation: PACU Anesthesia Type: General Level of consciousness: awake and alert Pain management: pain level controlled Vital Signs Assessment: post-procedure vital signs reviewed and stable Respiratory status: spontaneous breathing, nonlabored ventilation, respiratory function stable and patient connected to nasal cannula oxygen Cardiovascular status: blood pressure returned to baseline and stable Postop Assessment: no apparent nausea or vomiting Anesthetic complications: no   No complications documented.  Last Vitals:  Vitals:   10/21/20 1208 10/21/20 1330  BP: (!) 139/97 135/78  Pulse: 84 85  Resp: 16   Temp: 37.2 C 37.1 C  SpO2: 97% 98%    Last Pain:  Vitals:   10/21/20 1330  TempSrc: Oral  PainSc: 0-No pain                 Tiajuana Amass

## 2020-10-21 NOTE — Progress Notes (Signed)
Discharge instructions given to patient. Reviewed follow up appointment, postop care, and rx.

## 2020-10-21 NOTE — Plan of Care (Signed)
  Problem: Education: °Goal: Knowledge of General Education information will improve °Description: Including pain rating scale, medication(s)/side effects and non-pharmacologic comfort measures °Outcome: Completed/Met °  °Problem: Health Behavior/Discharge Planning: °Goal: Ability to manage health-related needs will improve °Outcome: Completed/Met °  °Problem: Clinical Measurements: °Goal: Ability to maintain clinical measurements within normal limits will improve °Outcome: Completed/Met °Goal: Will remain free from infection °Outcome: Completed/Met °Goal: Diagnostic test results will improve °Outcome: Completed/Met °Goal: Respiratory complications will improve °Outcome: Completed/Met °Goal: Cardiovascular complication will be avoided °Outcome: Completed/Met °  °Problem: Activity: °Goal: Risk for activity intolerance will decrease °Outcome: Completed/Met °  °Problem: Nutrition: °Goal: Adequate nutrition will be maintained °Outcome: Completed/Met °  °Problem: Coping: °Goal: Level of anxiety will decrease °Outcome: Completed/Met °  °Problem: Elimination: °Goal: Will not experience complications related to bowel motility °Outcome: Completed/Met °Goal: Will not experience complications related to urinary retention °Outcome: Completed/Met °  °Problem: Pain Managment: °Goal: General experience of comfort will improve °Outcome: Completed/Met °  °Problem: Safety: °Goal: Ability to remain free from injury will improve °Outcome: Completed/Met °  °Problem: Skin Integrity: °Goal: Risk for impaired skin integrity will decrease °Outcome: Completed/Met °  °Problem: Education: °Goal: Knowledge of the prescribed therapeutic regimen will improve °Outcome: Completed/Met °Goal: Understanding of sexual limitations or changes related to disease process or condition will improve °Outcome: Completed/Met °Goal: Individualized Educational Video(s) °Outcome: Completed/Met °  °Problem: Self-Concept: °Goal: Communication of feelings regarding  changes in body function or appearance will improve °Outcome: Completed/Met °  °Problem: Skin Integrity: °Goal: Demonstration of wound healing without infection will improve °Outcome: Completed/Met °  °

## 2020-10-21 NOTE — Transfer of Care (Signed)
Immediate Anesthesia Transfer of Care Note  Patient: Mary Banks  Procedure(s) Performed: TOTAL LAPAROSCOPIC HYSTERECTOMY WITH  BILATERAL SALPINGECTOMY, RIGHT OOPHERECTOMY; LYSIS OF ADHESIONS; VAGINAL MORSELLATION (N/A ) CYSTOSCOPY (N/A )  Patient Location: PACU  Anesthesia Type:General  Level of Consciousness: awake, alert  and oriented  Airway & Oxygen Therapy: Patient Spontanous Breathing and Patient connected to face mask oxygen  Post-op Assessment: Report given to RN, Post -op Vital signs reviewed and stable and Patient moving all extremities  Post vital signs: Reviewed and stable  Last Vitals:  Vitals Value Taken Time  BP 125/72 10/21/20 1105  Temp 36.5 C 10/21/20 1105  Pulse 77 10/21/20 1106  Resp 17 10/21/20 1106  SpO2 98 % 10/21/20 1106  Vitals shown include unvalidated device data.  Last Pain:  Vitals:   10/21/20 0639  TempSrc:   PainSc: 6          Complications: No complications documented.

## 2020-10-21 NOTE — Progress Notes (Signed)
Update to History and Physical  No marked change in status since office pre-op visit.   Patient examined.   OK to proceed with surgery. 

## 2020-10-21 NOTE — Anesthesia Procedure Notes (Signed)
Procedure Name: Intubation Date/Time: 10/21/2020 7:42 AM Performed by: Amadeo Garnet, CRNA Pre-anesthesia Checklist: Patient identified, Emergency Drugs available, Suction available and Patient being monitored Patient Re-evaluated:Patient Re-evaluated prior to induction Oxygen Delivery Method: Circle system utilized Preoxygenation: Pre-oxygenation with 100% oxygen Induction Type: IV induction Ventilation: Mask ventilation without difficulty, Oral airway inserted - appropriate to patient size and Two handed mask ventilation required Laryngoscope Size: Mac and 4 Grade View: Grade II Tube type: Oral Number of attempts: 1 Airway Equipment and Method: Stylet and Oral airway Placement Confirmation: ETT inserted through vocal cords under direct vision,  positive ETCO2 and breath sounds checked- equal and bilateral Secured at: 22 cm Tube secured with: Tape Dental Injury: Teeth and Oropharynx as per pre-operative assessment

## 2020-10-22 ENCOUNTER — Encounter (HOSPITAL_COMMUNITY): Payer: Self-pay | Admitting: Obstetrics and Gynecology

## 2020-10-22 LAB — SURGICAL PATHOLOGY

## 2020-10-27 NOTE — Discharge Summary (Signed)
Physician Discharge Summary  Patient ID: Mary Banks MRN: [id number] DOB/AGE: October 07, 1970 50 y.o.  Admit date: 10/21/2020 Discharge date:  10/22/20  Admission Diagnoses: 1.  Uterine fibroid 2.  Right ovarian cyst  Discharge Diagnoses:  1.  Uterine fibroid 2.  Right ovarian adhesions. 3.  Status post total laparoscopic hysterectomy with bilateral salpingectomy, right oophorectomy, lysis of adhesions, vaginal morcellation of uterus in [place] bag,cystoscopy   Active Problems:   Status post laparoscopic hysterectomy   Discharged Condition: good  Hospital Course: The patient was admitted on 10/21/20 for a total laparoscopic hysterectomy with bilateral salpingectomy, right oophorectomy, lysis of adhesions, vaginal morcellation of uterus in [place] bag,cystoscopy which were performed without complication while under general anesthesia.  The patient's post op course was uneventful.  She had a Dilaudid and Toradol for pain control initially, and this was converted over to Percocet and Motrin when she began taking po well.  She ambulated independently and wore PAS and Ted hose for DVT prophylaxis while in bed. She received Lovenox peop for DVT prophylaxis.  Her foley catheter was removed at the termination of surgery, and she voided spontaneously post op. The patient's vital signs remained stable and she demonstrated no signs of infection during her hospitalization.  The patient's post op day zero Hgb was 13.3.  She was tolerating the this well.  She had very minimal vaginal bleeding, and her incisions demonstrated no signs of erythema or significant drainage.  She was found to be in good condition and ready for discharge on post op day zero.  Consults: None  Significant Diagnostic Studies: labs:  See hospital course  Treatments: surgery:  Total laparoscopic hysterectomy with bilateral salpingectomy, right oophorectomy, lysis of adhesions, vaginal morcellation of uterus in [place]  bag,cystoscopy  Discharge Exam: Blood pressure 124/70, pulse 100, temperature 98.4 F (36.9 C), temperature source Oral, resp. rate 16, height 5' 5.5" (1.664 m), weight (!) 139.7 kg, SpO2 100 %.  General: alert and cooperative Resp: clear to auscultation bilaterally Cardio: regular rate and rhythm, S1, S2 normal, no murmur, click, rub or gallop GI: soft, non-tender; bowel sounds normal; no masses,  no organomegaly and incision: clean, dry and intact Extremities: Ted hose and PAS on.  DPs 2+ bilaterally. Vaginal Bleeding: none  Disposition: Discharge disposition: 01-Home or Self Care     Discharge instructions were reviewed in verbal and written form.  Discharge Instructions    Discharge patient   Complete by: As directed    Discharge disposition: 01-Home or Self Care   Discharge patient date: 10/21/2020     Allergies as of 10/21/2020      Reactions   Morphine Other (See Comments)   Other Reaction: hallucinations Hallucinations   Echinacea-golden Seal [nutritional Supplements]    Specifically tea      Medication List    STOP taking these medications   naproxen sodium 220 MG tablet Commonly known as: ALEVE     TAKE these medications   acyclovir 800 MG tablet Commonly known as: ZOVIRAX Take 1 tablet 2 x /day for 5 days as needed for Herpes Flare & repea tas needed What changed:   how much to take  how to take this  when to take this  reasons to take this  additional instructions   ALPRAZolam 0.5 MG tablet Commonly known as: XANAX Take 0.5 mg by mouth 2 (two) times daily as needed for anxiety.   cetirizine 10 MG tablet Commonly known as: ZYRTEC Take 10 mg by mouth in the morning.  DIGESTIVE ENZYME PO Take 1 capsule by mouth in the morning.   ibuprofen 800 MG tablet Commonly known as: ADVIL Take 1 tablet (800 mg total) by mouth every 8 (eight) hours as needed.   losartan-hydrochlorothiazide 100-25 MG tablet Commonly known as: HYZAAR Take 1 tab  daily for blood pressure. What changed:   how much to take  how to take this  when to take this  additional instructions   MULTIVITAMIN ADULT PO Take 1 tablet by mouth in the morning.   multivitamin with minerals Tabs tablet Take 1 tablet by mouth in the morning.   oxyCODONE-acetaminophen 5-325 MG tablet Commonly known as: PERCOCET/ROXICET Take 1-2 tablets by mouth every 4 (four) hours as needed for moderate pain.   Ozempic (1 MG/DOSE) 4 MG/3ML Sopn Generic drug: Semaglutide (1 MG/DOSE) Inject 1 mg into the skin once a week. What changed: when to take this   phentermine 37.5 MG capsule Take 1 cap in the morning daily as needed for appetite and weight loss.   vitamin B-12 1000 MCG tablet Commonly known as: CYANOCOBALAMIN Take 1,000 mcg by mouth in the morning.   Vitamin D3 1.25 MG (50000 UT) Caps TAKE 1 CAPSULE ONCE A WEEK FOR VITAMIN D DEFICIENCY What changed:   how much to take  how to take this  when to take this  additional instructions      She will follow up in one week in the office.  Signed: Arloa Koh 10/27/2020, 11:00 AM

## 2020-10-29 ENCOUNTER — Encounter: Payer: Self-pay | Admitting: Obstetrics and Gynecology

## 2020-10-29 ENCOUNTER — Other Ambulatory Visit: Payer: Self-pay

## 2020-10-29 ENCOUNTER — Ambulatory Visit (INDEPENDENT_AMBULATORY_CARE_PROVIDER_SITE_OTHER): Payer: BC Managed Care – PPO | Admitting: Obstetrics and Gynecology

## 2020-10-29 VITALS — BP 122/68 | HR 91 | Ht 65.0 in | Wt 313.0 lb

## 2020-10-29 DIAGNOSIS — Z90710 Acquired absence of both cervix and uterus: Secondary | ICD-10-CM
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
## 2020-11-05 ENCOUNTER — Encounter: Payer: Self-pay | Admitting: Obstetrics and Gynecology

## 2020-11-05 MED ORDER — IBUPROFEN 800 MG PO TABS: 800.0000 mg | ORAL_TABLET | Freq: Three times a day (TID) | ORAL | 0 refills | Status: DC | PRN

## 2020-11-05 NOTE — Telephone Encounter (Signed)
Motrin refill sent to [facility] on [address].

## 2020-11-20 ENCOUNTER — Encounter: Payer: Self-pay | Admitting: Obstetrics and Gynecology

## 2020-11-20 NOTE — Telephone Encounter (Signed)
6 week post op visit is 11/28/20 at 1:45pm.

## 2020-11-20 NOTE — Telephone Encounter (Signed)
It really does take 6 weeks for the initial healing to take place following hysterectomy.  As long as she is not having additional symptoms such as bleeding, dysuria, unusual discharge, fever, nausea or vomiting, this is likely her normal healing.   Ok to keep scheduled appointment.

## 2020-11-22 MED ORDER — IBUPROFEN 800 MG PO TABS: 800.0000 mg | ORAL_TABLET | Freq: Three times a day (TID) | ORAL | 0 refills | Status: DC | PRN

## 2020-11-22 NOTE — Telephone Encounter (Signed)
Okay to refill ibuprofen 

## 2020-11-22 NOTE — Telephone Encounter (Signed)
Ok to refill the Ibuprofen.  Thank you!

## 2020-11-28 ENCOUNTER — Encounter: Payer: Self-pay | Admitting: Obstetrics and Gynecology

## 2020-11-28 ENCOUNTER — Other Ambulatory Visit: Payer: Self-pay

## 2020-11-28 ENCOUNTER — Ambulatory Visit (INDEPENDENT_AMBULATORY_CARE_PROVIDER_SITE_OTHER): Payer: BC Managed Care – PPO | Admitting: Obstetrics and Gynecology

## 2020-11-28 VITALS — BP 144/88 | HR 108 | Ht 65.25 in | Wt 317.0 lb

## 2020-11-28 DIAGNOSIS — Z90710 Acquired absence of both cervix and uterus: Secondary | ICD-10-CM
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
## 2020-12-03 ENCOUNTER — Ambulatory Visit: Payer: Self-pay | Admitting: Obstetrics and Gynecology

## 2020-12-08 DIAGNOSIS — U071 COVID-19: Secondary | ICD-10-CM

## 2020-12-08 HISTORY — DX: COVID-19: U07.1

## 2020-12-10 ENCOUNTER — Encounter: Payer: BC Managed Care – PPO | Admitting: Adult Health

## 2020-12-12 ENCOUNTER — Encounter: Payer: BC Managed Care – PPO | Admitting: Adult Health

## 2021-01-10 ENCOUNTER — Other Ambulatory Visit: Payer: Self-pay | Admitting: Adult Health

## 2021-01-10 DIAGNOSIS — E559 Vitamin D deficiency, unspecified: Secondary | ICD-10-CM

## 2021-01-13 ENCOUNTER — Ambulatory Visit (INDEPENDENT_AMBULATORY_CARE_PROVIDER_SITE_OTHER): Payer: BC Managed Care – PPO | Admitting: Obstetrics and Gynecology

## 2021-01-13 ENCOUNTER — Encounter: Payer: Self-pay | Admitting: Obstetrics and Gynecology

## 2021-01-13 ENCOUNTER — Other Ambulatory Visit: Payer: Self-pay

## 2021-01-13 VITALS — BP 134/80

## 2021-01-13 DIAGNOSIS — Z90710 Acquired absence of both cervix and uterus: Secondary | ICD-10-CM
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
## 2021-01-14 ENCOUNTER — Encounter: Payer: Self-pay | Admitting: Adult Health
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
## 2021-01-15 ENCOUNTER — Encounter: Payer: BC Managed Care – PPO | Admitting: Adult Health

## 2021-03-10 ENCOUNTER — Other Ambulatory Visit: Payer: Self-pay | Admitting: Adult Health

## 2021-03-28 ENCOUNTER — Encounter: Payer: BC Managed Care – PPO | Admitting: Adult Health
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
## 2021-04-01 ENCOUNTER — Other Ambulatory Visit: Payer: Self-pay

## 2021-04-01 ENCOUNTER — Encounter: Payer: Self-pay | Admitting: Nurse Practitioner

## 2021-04-01 ENCOUNTER — Ambulatory Visit: Payer: BC Managed Care – PPO | Admitting: Nurse Practitioner

## 2021-04-01 VITALS — BP 122/78 | HR 78 | Temp 97.2°F | Wt 316.0 lb

## 2021-04-01 DIAGNOSIS — E559 Vitamin D deficiency, unspecified: Secondary | ICD-10-CM

## 2021-04-01 DIAGNOSIS — R7309 Other abnormal glucose: Secondary | ICD-10-CM | POA: Diagnosis not present

## 2021-04-01 DIAGNOSIS — E782 Mixed hyperlipidemia: Secondary | ICD-10-CM | POA: Diagnosis not present

## 2021-04-01 DIAGNOSIS — I10 Essential (primary) hypertension: Secondary | ICD-10-CM | POA: Diagnosis not present

## 2021-04-01 DIAGNOSIS — Z6841 Body Mass Index (BMI) 40.0 and over, adult: Secondary | ICD-10-CM

## 2021-04-01 MED ORDER — MOUNJARO 7.5 MG/0.5ML ~~LOC~~ SOAJ: 7.5000 mg | SUBCUTANEOUS | 1 refills | Status: DC

## 2021-04-01 MED ORDER — PHENTERMINE HCL 37.5 MG PO TABS: ORAL_TABLET | ORAL | 2 refills | Status: DC

## 2021-04-01 NOTE — Patient Instructions (Signed)
Tirzepatide Injection What is this medication? TIRZEPATIDE (tir ZEP a tide) treats type 2 diabetes. It works by increasing insulin levels in your body, which decreases your blood sugar (glucose). Changes to diet and exercise are often combined with this medication. This medicine may be used for other purposes; ask your health care provider or pharmacist if you have questions. COMMON BRAND NAME(S): MOUNJARO What should I tell my care team before I take this medication? They need to know if you have any of these conditions: Endocrine tumors (MEN 2) or if someone in your family had these tumors Eye disease, vision problems Gallbladder disease History of pancreatitis Kidney disease Stomach or intestine problems Thyroid cancer or if someone in your family had thyroid cancer An unusual or allergic reaction to tirzepatide, other medications, foods, dyes, or preservatives Pregnant or trying to get pregnant Breast-feeding How should I use this medication? This medication is injected under the skin. You will be taught how to prepare and give it. It is given once every week (every 7 days). Keep taking it unless your health care provider tells you to stop. If you use this medication with insulin, you should inject this medication and the insulin separately. Do not mix them together. Do not give the injections right next to each other. Change (rotate) injection sites with each injection. This medication comes with INSTRUCTIONS FOR USE. Ask your pharmacist for directions on how to use this medication. Read the information carefully. Talk to your pharmacist or care team if you have questions. It is important that you put your used needles and syringes in a special sharps container. Do not put them in a trash can. If you do not have a sharps container, call your pharmacist or care team to get one. A special MedGuide will be given to you by the pharmacist with each prescription and refill. Be sure to read this  information carefully each time. Talk to your care team about the use of this medication in children. Special care may be needed. Overdosage: If you think you have taken too much of this medicine contact a poison control center or emergency room at once. NOTE: This medicine is only for you. Do not share this medicine with others. What if I miss a dose? If you miss a dose, take it as soon as you can unless it is more than 4 days (96 hours) late. If it is more than 4 days late, skip the missed dose. Take the next dose at the normal time. Do not take 2 doses within 3 days of each other. What may interact with this medication? Alcohol containing beverages Antiviral medications for HIV or AIDS Aspirin and aspirin-like medications Beta-blockers like atenolol, metoprolol, propranolol Certain medications for blood pressure, heart disease, irregular heart beat Chromium Clonidine Diuretics Female hormones, such as estrogens or progestins, birth control pills Fenofibrate Gemfibrozil Guanethidine Isoniazid Lanreotide Female hormones or anabolic steroids MAOIs like Carbex, Eldepryl, Marplan, Nardil, and Parnate Medications for weight loss Medications for allergies, asthma, cold, or cough Medications for depression, anxiety, or psychotic disturbances Niacin Nicotine NSAIDs, medications for pain and inflammation, like ibuprofen or naproxen Octreotide Other medications for diabetes, like glyburide, glipizide, or glimepiride Pasireotide Pentamidine Phenytoin Probenecid Quinolone antibiotics such as ciprofloxacin, levofloxacin, ofloxacin Reserpine Some herbal dietary supplements Steroid medications such as prednisone or cortisone Sulfamethoxazole; trimethoprim Thyroid hormones Warfarin This list may not describe all possible interactions. Give your health care provider a list of all the medicines, herbs, non-prescription drugs, or dietary supplements you  use. Also tell them if you smoke, drink  alcohol, or use illegal drugs. Some items may interact with your medicine. What should I watch for while using this medication? Visit your care team for regular checks on your progress. Drink plenty of fluids while taking this medication. Check with your care team if you get an attack of severe diarrhea, nausea, and vomiting. The loss of too much body fluid can make it dangerous for you to take this medication. A test called the HbA1C (A1C) will be monitored. This is a simple blood test. It measures your blood sugar control over the last 2 to 3 months. You will receive this test every 3 to 6 months. Learn how to check your blood sugar. Learn the symptoms of low and high blood sugar and how to manage them. Always carry a quick-source of sugar with you in case you have symptoms of low blood sugar. Examples include hard sugar candy or glucose tablets. Make sure others know that you can choke if you eat or drink when you develop serious symptoms of low blood sugar, such as seizures or unconsciousness. They must get medical help at once. Tell your care team if you have high blood sugar. You might need to change the dose of your medication. If you are sick or exercising more than usual, you might need to change the dose of your medication. Do not skip meals. Ask your care team if you should avoid alcohol. Many nonprescription cough and cold products contain sugar or alcohol. These can affect blood sugar. Pens should never be shared. Even if the needle is changed, sharing may result in passing of viruses like hepatitis or HIV. Wear a medical ID bracelet or chain, and carry a card that describes your disease and details of your medication and dosage times. Birth control may not work properly while you are taking this medication. If you take birth control pills by mouth, your care team may recommend another type of birth control for 4 weeks after you start this medication and for 4 weeks after each increase in  your dose of this medication. Ask your care team which birth control methods you should use. What side effects may I notice from receiving this medication? Side effects that you should report to your care team as soon as possible: Allergic reactions-skin rash, itching, hives, swelling of the face, lips, tongue, or throat Change in vision Dehydration-increased thirst, dry mouth, feeling faint or lightheaded, headache, dark yellow or brown urine Gallbladder problems-severe stomach pain, nausea, vomiting, fever Kidney injury-decrease in the amount of urine, swelling of the ankles, hands, or feet Pancreatitis-severe stomach pain that spreads to your back or gets worse after eating or when touched, fever, nausea, vomiting Thyroid cancer-new mass or lump in the neck, pain or trouble swallowing, trouble breathing, hoarseness Side effects that usually do not require medical attention (report these to your care team if they continue or are bothersome): Constipation Diarrhea Loss of Appetite Nausea Stomach pain Upset stomach Vomiting This list may not describe all possible side effects. Call your doctor for medical advice about side effects. You may report side effects to FDA at 1-800-FDA-1088. Where should I keep my medication? Keep out of the reach of children and pets. Refrigeration (preferred): Store unopened pens in a refrigerator between 2 and 8 degrees C (36 and 46 degrees F). Keep it in the original carton until you are ready to take it. Do not freeze or use if the medication has been frozen.  Protect from light. Get rid of any unused medication after the expiration date on the label. Room Temperature: The pen may be stored at room temperature below 30 degrees C (86 degrees F) for up to a total of 21 days if needed. Protect from light. Avoid exposure to extreme heat. If it is stored at room temperature, throw away any unused medication after 21 days or after it expires, whichever is first. The  pen has glass parts. Handle it carefully. If you drop the pen on a hard surface, do not use it. Use a new pen for your injection. To get rid of medications that are no longer needed or have expired: Take the medication to a medication take-back program. Check with your pharmacy or law enforcement to find a location. If you cannot return the medication, ask your pharmacist or care team how to get rid of this medication safely. NOTE: This sheet is a summary. It may not cover all possible information. If you have questions about this medicine, talk to your doctor, pharmacist, or health care provider.  2022 Elsevier/Gold Standard (2020-11-19 13:57:48)

## 2021-04-02 LAB — COMPLETE METABOLIC PANEL WITH GFR
AG Ratio: 1.4 (calc) (ref 1.0–2.5)
ALT: 12 U/L (ref 6–29)
AST: 18 U/L (ref 10–35)
Albumin: 4.1 g/dL (ref 3.6–5.1)
Alkaline phosphatase (APISO): 60 U/L (ref 37–153)
BUN: 9 mg/dL (ref 7–25)
CO2: 29 mmol/L (ref 20–32)
Calcium: 9.5 mg/dL (ref 8.6–10.4)
Chloride: 103 mmol/L (ref 98–110)
Creat: 0.8 mg/dL (ref 0.50–1.03)
Globulin: 2.9 g/dL (calc) (ref 1.9–3.7)
Glucose, Bld: 84 mg/dL (ref 65–99)
Potassium: 4.6 mmol/L (ref 3.5–5.3)
Sodium: 139 mmol/L (ref 135–146)
Total Bilirubin: 0.5 mg/dL (ref 0.2–1.2)
Total Protein: 7 g/dL (ref 6.1–8.1)
eGFR: 89 mL/min/{1.73_m2} (ref >=60)

## 2021-04-02 LAB — CBC WITH DIFFERENTIAL/PLATELET
Absolute Monocytes: 508 cells/uL (ref 200–950)
Basophils Absolute: 32 cells/uL (ref 0–200)
Basophils Relative: 0.6 %
Eosinophils Absolute: 59 cells/uL (ref 15–500)
Eosinophils Relative: 1.1 %
HCT: 39.4 % (ref 35.0–45.0)
Hemoglobin: 13.2 g/dL (ref 11.7–15.5)
Lymphs Abs: 2117 cells/uL (ref 850–3900)
MCH: 30.6 pg (ref 27.0–33.0)
MCHC: 33.5 g/dL (ref 32.0–36.0)
MCV: 91.4 fL (ref 80.0–100.0)
MPV: 10 fL (ref 7.5–12.5)
Monocytes Relative: 9.4 %
Neutro Abs: 2684 cells/uL (ref 1500–7800)
Neutrophils Relative %: 49.7 %
Platelets: 307 10*3/uL (ref 140–400)
RBC: 4.31 10*6/uL (ref 3.80–5.10)
RDW: 13 % (ref 11.0–15.0)
Total Lymphocyte: 39.2 %
WBC: 5.4 10*3/uL (ref 3.8–10.8)

## 2021-04-02 LAB — LIPID PANEL
Cholesterol: 204 mg/dL — ABNORMAL HIGH (ref <200)
HDL: 51 mg/dL (ref >=50)
LDL Cholesterol (Calc): 130 mg/dL (calc) — ABNORMAL HIGH
Non-HDL Cholesterol (Calc): 153 mg/dL (calc) — ABNORMAL HIGH (ref <130)
Total CHOL/HDL Ratio: 4 (calc) (ref <5.0)
Triglycerides: 115 mg/dL (ref <150)

## 2021-04-02 LAB — HEMOGLOBIN A1C
Hgb A1c MFr Bld: 5.5 % of total Hgb (ref <5.7)
Mean Plasma Glucose: 111 mg/dL
eAG (mmol/L): 6.2 mmol/L

## 2021-04-02 LAB — TSH: TSH: 1.65 mIU/L

## 2021-04-02 LAB — VITAMIN D 25 HYDROXY (VIT D DEFICIENCY, FRACTURES): Vit D, 25-Hydroxy: 72 ng/mL (ref 30–100)

## 2021-04-16 IMAGING — MG DIGITAL SCREENING BILATERAL MAMMOGRAM WITH TOMO AND CAD
7 series · 8 of 23 positions shown · non-contrast
Comparison: Previous exam(s).

CLINICAL DATA: Screening.

EXAM:
DIGITAL SCREENING BILATERAL MAMMOGRAM WITH TOMO AND CAD

[R MLO synth-2D]
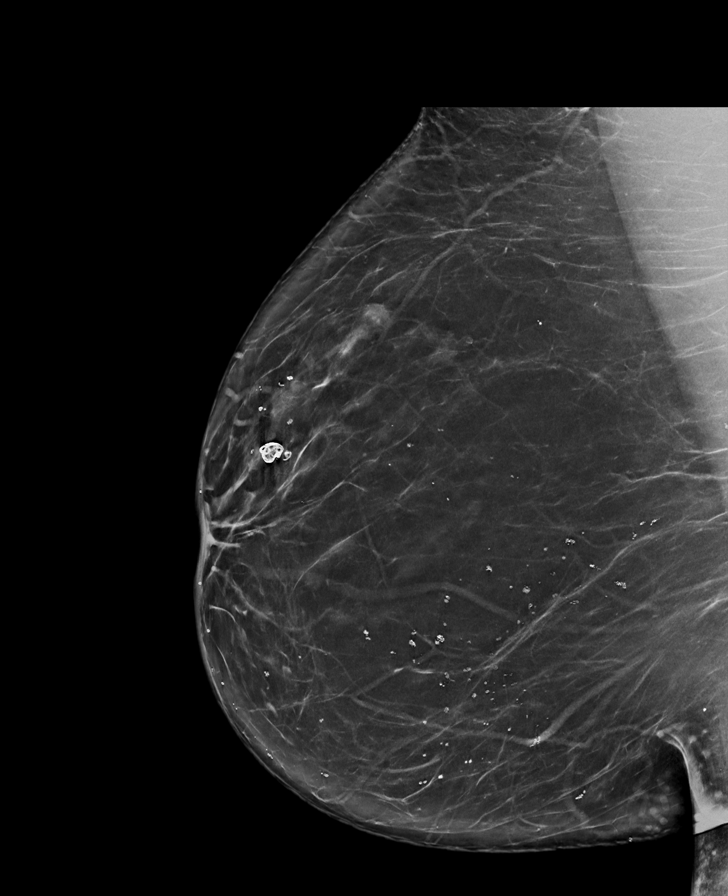

[R CC synth-2D]
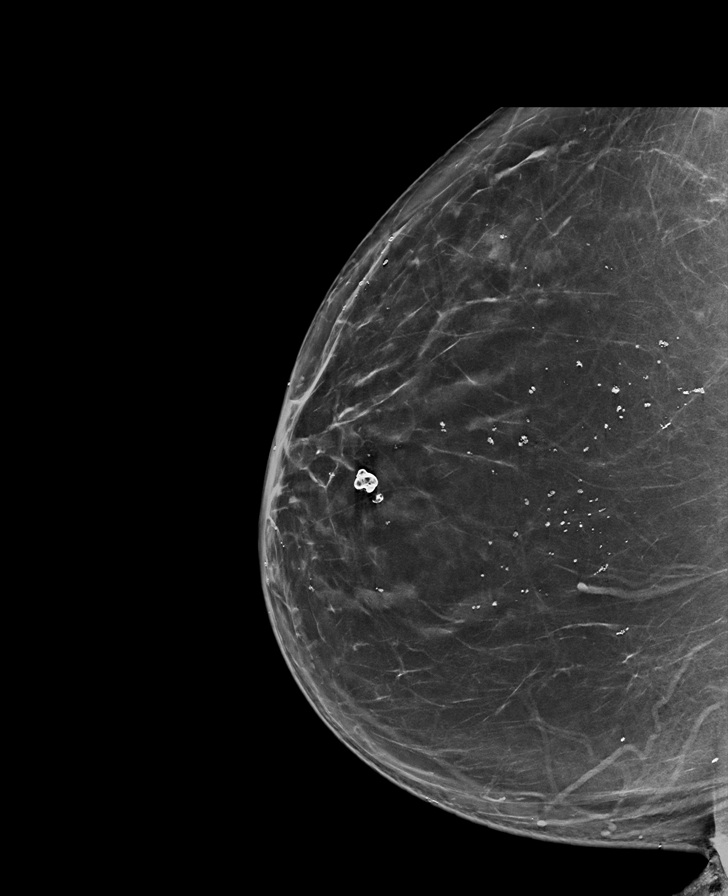

[L MLO synth-2D]
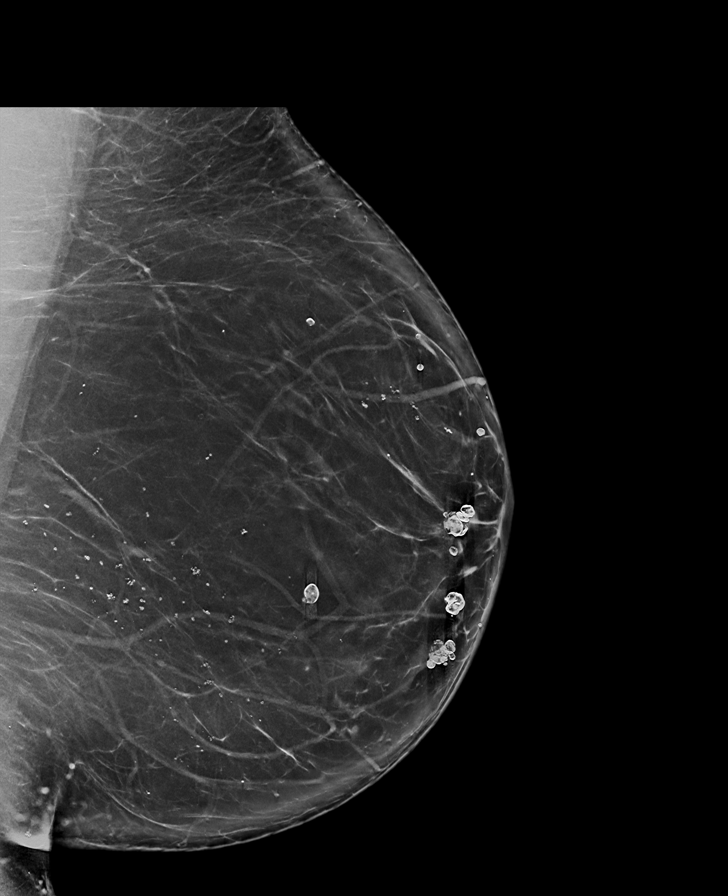

[R MLO tomo · 2 of 98 frames shown]
[frame 32/98]
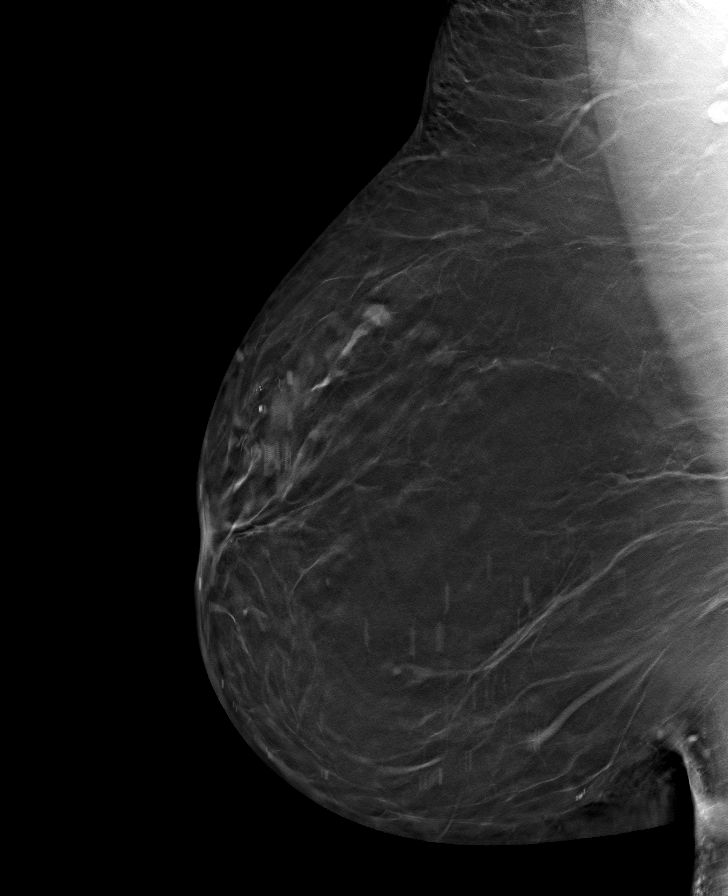
[frame 49/98]
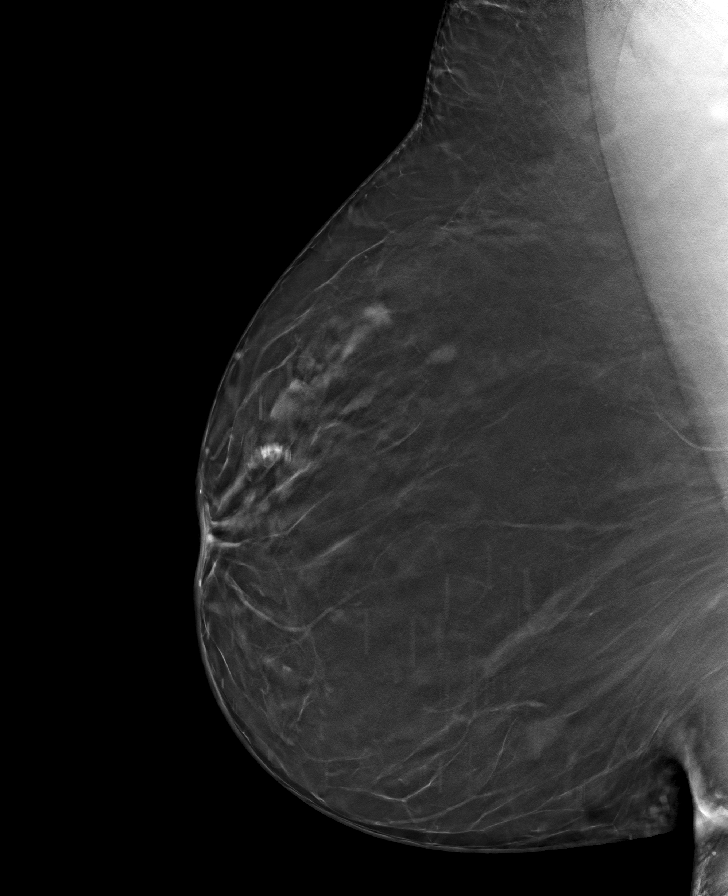

[R CC tomo · tomo slice 45/90.0]
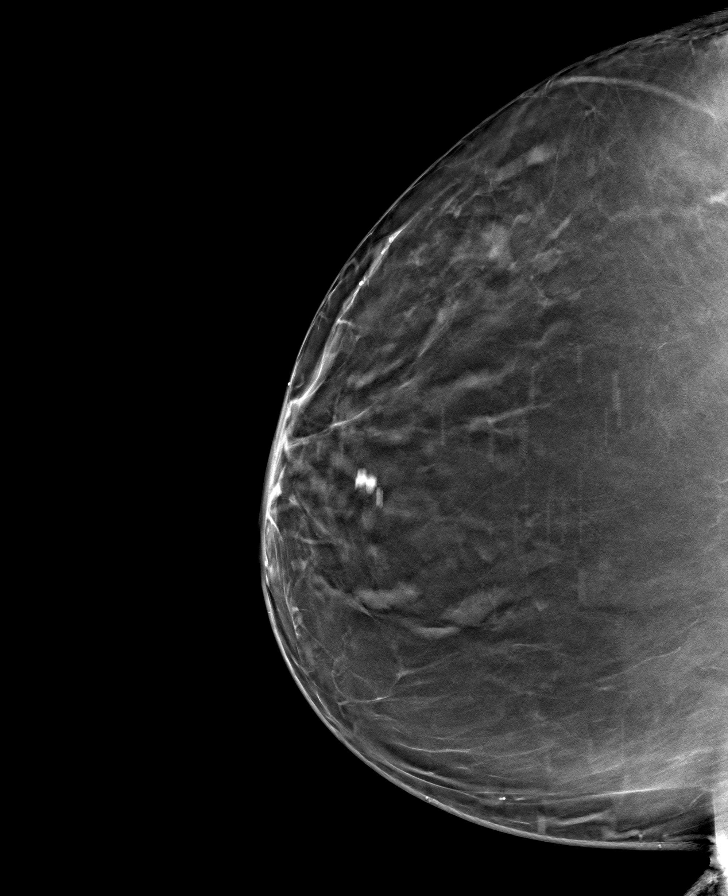

[L CV tomo · tomo slice 41/82.0]
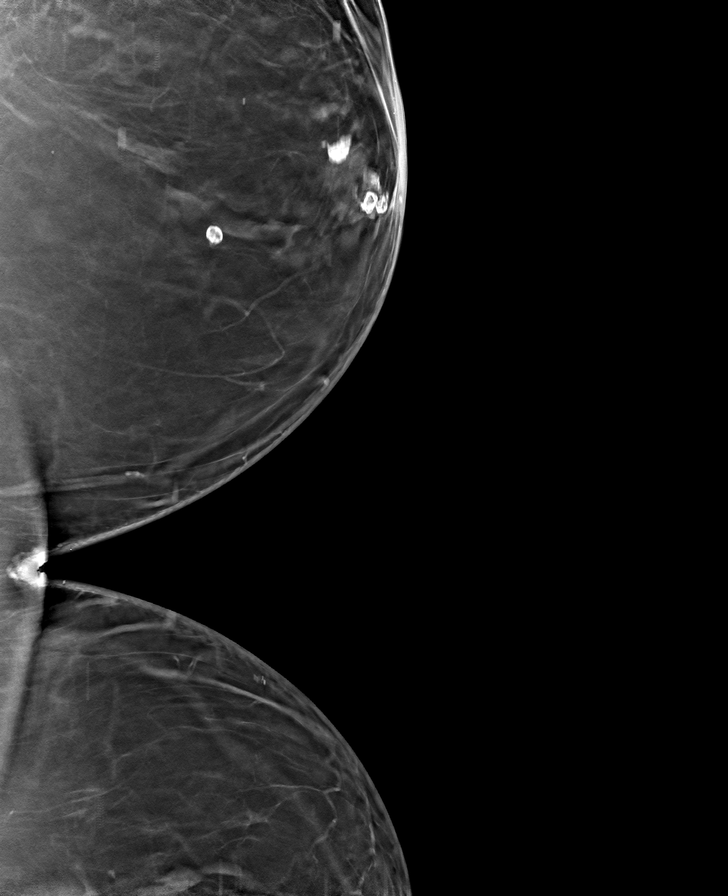

[L CC tomo · tomo slice 45/89.0]
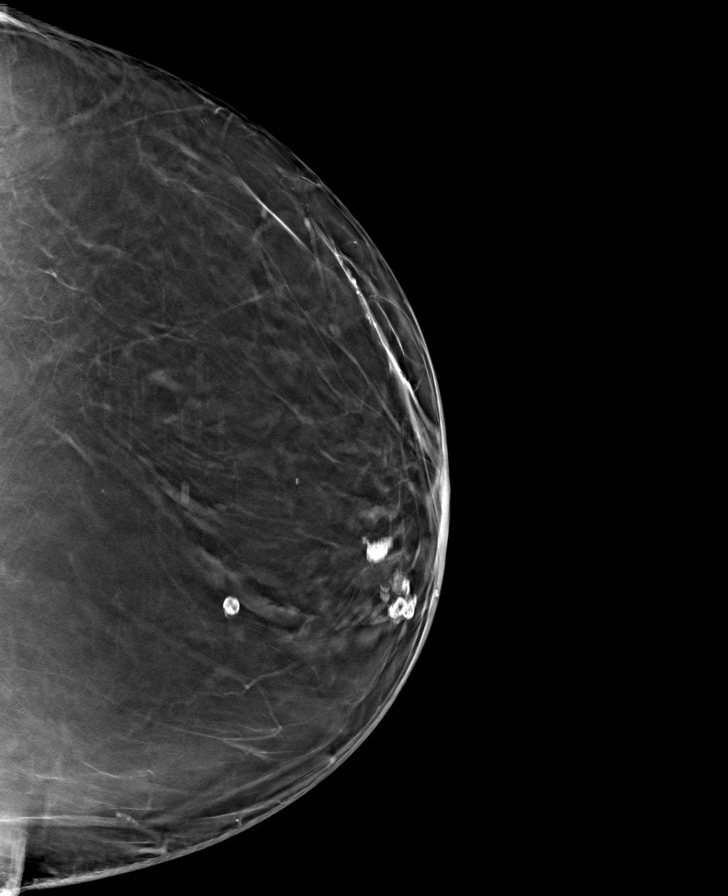

[8 of 23 positions shown; findings below may reference images not displayed]

ACR Breast Density Category b: There are scattered areas of
fibroglandular density.
FINDINGS: There are no findings suspicious for malignancy. Images were
processed with CAD.
IMPRESSION: No mammographic evidence of malignancy. A result letter of this
screening mammogram will be mailed directly to the patient.

RECOMMENDATION:
Screening mammogram in one year. (Code:[id number])

BI-RADS CATEGORY  1: Negative.
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
## 2021-05-01 ENCOUNTER — Other Ambulatory Visit: Payer: Self-pay

## 2021-05-01 ENCOUNTER — Encounter: Payer: Self-pay | Admitting: Adult Health

## 2021-05-01 ENCOUNTER — Ambulatory Visit (INDEPENDENT_AMBULATORY_CARE_PROVIDER_SITE_OTHER): Payer: BC Managed Care – PPO | Admitting: Adult Health

## 2021-05-01 VITALS — BP 122/80 | HR 75 | Temp 97.7°F | Ht 65.0 in | Wt 307.0 lb

## 2021-05-01 DIAGNOSIS — Z0000 Encounter for general adult medical examination without abnormal findings: Secondary | ICD-10-CM

## 2021-05-01 DIAGNOSIS — Z131 Encounter for screening for diabetes mellitus: Secondary | ICD-10-CM | POA: Diagnosis not present

## 2021-05-01 DIAGNOSIS — Z79899 Other long term (current) drug therapy: Secondary | ICD-10-CM | POA: Diagnosis not present

## 2021-05-01 DIAGNOSIS — R5382 Chronic fatigue, unspecified: Secondary | ICD-10-CM

## 2021-05-01 DIAGNOSIS — Z136 Encounter for screening for cardiovascular disorders: Secondary | ICD-10-CM | POA: Diagnosis not present

## 2021-05-01 DIAGNOSIS — I10 Essential (primary) hypertension: Secondary | ICD-10-CM

## 2021-05-01 DIAGNOSIS — Z1389 Encounter for screening for other disorder: Secondary | ICD-10-CM

## 2021-05-01 DIAGNOSIS — E782 Mixed hyperlipidemia: Secondary | ICD-10-CM

## 2021-05-01 DIAGNOSIS — E559 Vitamin D deficiency, unspecified: Secondary | ICD-10-CM

## 2021-05-01 DIAGNOSIS — R7309 Other abnormal glucose: Secondary | ICD-10-CM

## 2021-05-01 DIAGNOSIS — F419 Anxiety disorder, unspecified: Secondary | ICD-10-CM

## 2021-05-01 DIAGNOSIS — Z0001 Encounter for general adult medical examination with abnormal findings: Secondary | ICD-10-CM

## 2021-05-01 DIAGNOSIS — Z130 Encounter for screening for diseases of the blood and blood-forming organs and certain disorders involving the immune mechanism: Secondary | ICD-10-CM

## 2021-05-01 DIAGNOSIS — E538 Deficiency of other specified B group vitamins: Secondary | ICD-10-CM

## 2021-05-01 DIAGNOSIS — Z90710 Acquired absence of both cervix and uterus: Secondary | ICD-10-CM

## 2021-05-01 NOTE — Patient Instructions (Addendum)
Mary Banks , Thank you for taking time to come for your Annual Wellness Visit. I appreciate your ongoing commitment to your health goals. Please review the following plan we discussed and let me know if I can assist you in the future.   These are the goals we discussed:  Goals      HEMOGLOBIN A1C < 5.7     LDL CALC < 100     Weight (lb) < 290 lb (131.5 kg)        This is a list of the screening recommended for you and due dates:  Health Maintenance  Topic Date Due   Zoster (Shingles) Vaccine (1 of 2) 08/01/2021*   Pneumococcal Vaccination (1 - PCV) 05/02/2031*   Mammogram  08/07/2022   Pap Smear  06/07/2023   Tetanus Vaccine  03/21/2028   Colon Cancer Screening  08/15/2028   Flu Shot  Completed   COVID-19 Vaccine  Completed   HPV Vaccine  Aged Out   Hepatitis C Screening: USPSTF Recommendation to screen - Ages 18-79 yo.  Discontinued   HIV Screening  Discontinued  *Topic was postponed. The date shown is not the original due date.     High-Fiber Eating Plan Fiber, also called dietary fiber, is a type of carbohydrate. It is found foods such as fruits, vegetables, whole grains, and beans. A high-fiber diet can have many health benefits. Your health care provider may recommend a high-fiber diet to help: Prevent constipation. Fiber can make your bowel movements more regular. Lower your cholesterol. Relieve the following conditions: Inflammation of veins in the anus (hemorrhoids). Inflammation of specific areas of the digestive tract (uncomplicated diverticulosis). A problem of the large intestine, also called the colon, that sometimes causes pain and diarrhea (irritable bowel syndrome, or IBS). Prevent overeating as part of a weight-loss plan. Prevent heart disease, type 2 diabetes, and certain cancers. What are tips for following this plan? Reading food labels  Check the nutrition facts label on food products for the amount of dietary fiber. Choose foods that have 5 grams of  fiber or more per serving. The goals for recommended daily fiber intake include: Men (age 26 or younger): 34-38 g. Men (over age 22): 28-34 g. Women (age 76 or younger): 25-28 g. Women (over age 26): 22-25 g. Your daily fiber goal is _____________ g. Shopping Choose whole fruits and vegetables instead of processed forms, such as apple juice or applesauce. Choose a wide variety of high-fiber foods such as avocados, lentils, oats, and kidney beans. Read the nutrition facts label of the foods you choose. Be aware of foods with added fiber. These foods often have high sugar and sodium amounts per serving. Cooking Use whole-grain flour for baking and cooking. Cook with brown rice instead of white rice. Meal planning Start the day with a breakfast that is high in fiber, such as a cereal that contains 5 g of fiber or more per serving. Eat breads and cereals that are made with whole-grain flour instead of refined flour or white flour. Eat brown rice, bulgur wheat, or millet instead of white rice. Use beans in place of meat in soups, salads, and pasta dishes. Be sure that half of the grains you eat each day are whole grains. General information You can get the recommended daily intake of dietary fiber by: Eating a variety of fruits, vegetables, grains, nuts, and beans. Taking a fiber supplement if you are not able to take in enough fiber in your diet. It is better  to get fiber through food than from a supplement. Gradually increase how much fiber you consume. If you increase your intake of dietary fiber too quickly, you may have bloating, cramping, or gas. Drink plenty of water to help you digest fiber. Choose high-fiber snacks, such as berries, raw vegetables, nuts, and popcorn. What foods should I eat? Fruits Berries. Pears. Apples. Oranges. Avocado. Prunes and raisins. Dried figs. Vegetables Sweet potatoes. Spinach. Kale. Artichokes. Cabbage. Broccoli. Cauliflower. Green peas. Carrots.  Squash. Grains Whole-grain breads. Multigrain cereal. Oats and oatmeal. Brown rice. Barley. Bulgur wheat. [place]. Quinoa. Bran muffins. Popcorn. Rye wafer crackers. Meats and other proteins Navy beans, kidney beans, and pinto beans. Soybeans. Split peas. Lentils. Nuts and seeds. Dairy Fiber-fortified yogurt. Beverages Fiber-fortified soy milk. Fiber-fortified orange juice. Other foods Fiber bars. The items listed above may not be a complete list of recommended foods and beverages. Contact a dietitian for more information. What foods should I avoid? Fruits Fruit juice. Cooked, strained fruit. Vegetables Fried potatoes. Canned vegetables. Well-cooked vegetables. Grains White bread. Pasta made with refined flour. White rice. Meats and other proteins Fatty cuts of meat. Fried chicken or fried fish. Dairy Milk. Yogurt. Cream cheese. Sour cream. Fats and oils Butters. Beverages Soft drinks. Other foods Cakes and pastries. The items listed above may not be a complete list of foods and beverages to avoid. Talk with your dietitian about what choices are best for you. Summary Fiber is a type of carbohydrate. It is found in foods such as fruits, vegetables, whole grains, and beans. A high-fiber diet has many benefits. It can help to prevent constipation, lower blood cholesterol, aid weight loss, and reduce your risk of heart disease, diabetes, and certain cancers. Increase your intake of fiber gradually. Increasing fiber too quickly may cause cramping, bloating, and gas. Drink plenty of water while you increase the amount of fiber you consume. The best sources of fiber include whole fruits and vegetables, whole grains, nuts, seeds, and beans. This information is not intended to replace advice given to you by your health care provider. Make sure you discuss any questions you have with your health care provider. Document Revised: 10/26/2019 Document Reviewed: 10/26/2019 Elsevier Patient  Education  2022 [employer].

## 2021-05-02 LAB — VITAMIN B12: Vitamin B-12: 687 pg/mL (ref 200–1100)

## 2021-05-02 LAB — MAGNESIUM: Magnesium: 2.2 mg/dL (ref 1.5–2.5)

## 2021-05-02 LAB — INSULIN, RANDOM: Insulin: 7.9 u[IU]/mL

## 2021-05-03 LAB — MICROALBUMIN / CREATININE URINE RATIO
Creatinine, Urine: 234 mg/dL (ref 20–275)
Microalb Creat Ratio: 2 mcg/mg creat (ref <30)
Microalb, Ur: 0.5 mg/dL

## 2021-05-03 LAB — URINALYSIS, ROUTINE W REFLEX MICROSCOPIC
Bilirubin Urine: NEGATIVE
Glucose, UA: NEGATIVE
Hgb urine dipstick: NEGATIVE
Ketones, ur: NEGATIVE
Leukocytes,Ua: NEGATIVE
Nitrite: NEGATIVE
Protein, ur: NEGATIVE
Specific Gravity, Urine: 1.024 (ref 1.001–1.035)
pH: 6.5 (ref 5.0–8.0)

## 2021-05-05 DIAGNOSIS — Z1389 Encounter for screening for other disorder: Secondary | ICD-10-CM

## 2021-05-23 ENCOUNTER — Encounter: Payer: Self-pay | Admitting: Nurse Practitioner

## 2021-06-01 ENCOUNTER — Encounter: Payer: Self-pay | Admitting: Nurse Practitioner

## 2021-06-02 ENCOUNTER — Other Ambulatory Visit: Payer: Self-pay | Admitting: Nurse Practitioner

## 2021-06-02 DIAGNOSIS — R7309 Other abnormal glucose: Secondary | ICD-10-CM

## 2021-06-02 MED ORDER — MOUNJARO 10 MG/0.5ML ~~LOC~~ SOAJ: 10.0000 mg | SUBCUTANEOUS | 3 refills | Status: DC

## 2021-06-16 ENCOUNTER — Other Ambulatory Visit: Payer: Self-pay | Admitting: Adult Health

## 2021-06-16 DIAGNOSIS — E559 Vitamin D deficiency, unspecified: Secondary | ICD-10-CM

## 2021-07-09 ENCOUNTER — Telehealth: Payer: BC Managed Care – PPO | Admitting: Physician Assistant

## 2021-07-09 DIAGNOSIS — J208 Acute bronchitis due to other specified organisms: Secondary | ICD-10-CM

## 2021-07-09 MED ORDER — BENZONATATE 100 MG PO CAPS: 100.0000 mg | ORAL_CAPSULE | Freq: Three times a day (TID) | ORAL | 0 refills | Status: DC | PRN

## 2021-07-09 NOTE — Progress Notes (Signed)
I have spent 5 minutes in review of e-visit questionnaire, review and updating patient chart, medical decision making and response to patient.   Loetta Connelley Cody Mariano Doshi, PA-C    

## 2021-07-09 NOTE — Progress Notes (Signed)
We are sorry that you are not feeling well.  Here is how we plan to help! ° °Based on your presentation I believe you most likely have A cough due to a virus.  This is called viral bronchitis and is best treated by rest, plenty of fluids and control of the cough.  You may use Ibuprofen or Tylenol as directed to help your symptoms.   °  °In addition you may use A prescription cough medication called Tessalon Perles 100mg. You may take 1-2 capsules every 8 hours as needed for your cough. ° ° °From your responses in the eVisit questionnaire you describe inflammation in the upper respiratory tract which is causing a significant cough.  This is commonly called Bronchitis and has four common causes:   °Allergies °Viral Infections °Acid Reflux °Bacterial Infection °Allergies, viruses and acid reflux are treated by controlling symptoms or eliminating the cause. An example might be a cough caused by taking certain blood pressure medications. You stop the cough by changing the medication. Another example might be a cough caused by acid reflux. Controlling the reflux helps control the cough. ° °USE OF BRONCHODILATOR ("RESCUE") INHALERS: °There is a risk from using your bronchodilator too frequently.  The risk is that over-reliance on a medication which only relaxes the muscles surrounding the breathing tubes can reduce the effectiveness of medications prescribed to reduce swelling and congestion of the tubes themselves.  Although you feel brief relief from the bronchodilator inhaler, your asthma may actually be worsening with the tubes becoming more swollen and filled with mucus.  This can delay other crucial treatments, such as oral steroid medications. If you need to use a bronchodilator inhaler daily, several times per day, you should discuss this with your provider.  There are probably better treatments that could be used to keep your asthma under control.  °   °HOME CARE °Only take medications as instructed by your  medical team. °Complete the entire course of an antibiotic. °Drink plenty of fluids and get plenty of rest. °Avoid close contacts especially the very young and the elderly °Cover your mouth if you cough or cough into your sleeve. °Always remember to wash your hands °A steam or ultrasonic humidifier can help congestion.  ° °GET HELP RIGHT AWAY IF: °You develop worsening fever. °You become short of breath °You cough up blood. °Your symptoms persist after you have completed your treatment plan °MAKE SURE YOU  °Understand these instructions. °Will watch your condition. °Will get help right away if you are not doing well or get worse. °  ° °Thank you for choosing an e-visit. ° °Your e-visit answers were reviewed by a board certified advanced clinical practitioner to complete your personal care plan. Depending upon the condition, your plan could have included both over the counter or prescription medications. ° °Please review your pharmacy choice. Make sure the pharmacy is open so you can pick up prescription now. If there is a problem, you may contact your provider through MyChart messaging and have the prescription routed to another pharmacy.  Your safety is important to us. If you have drug allergies check your prescription carefully.  ° °For the next 24 hours you can use MyChart to ask questions about today's visit, request a non-urgent call back, or ask for a work or school excuse. °You will get an email in the next two days asking about your experience. I hope that your e-visit has been valuable and will speed your recovery. ° °
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
## 2021-08-06 ENCOUNTER — Ambulatory Visit: Payer: BC Managed Care – PPO | Admitting: Adult Health

## 2021-08-06 DIAGNOSIS — R7309 Other abnormal glucose: Secondary | ICD-10-CM

## 2021-08-06 DIAGNOSIS — E782 Mixed hyperlipidemia: Secondary | ICD-10-CM

## 2021-08-06 DIAGNOSIS — E538 Deficiency of other specified B group vitamins: Secondary | ICD-10-CM

## 2021-08-06 DIAGNOSIS — E559 Vitamin D deficiency, unspecified: Secondary | ICD-10-CM

## 2021-08-06 DIAGNOSIS — I10 Essential (primary) hypertension: Secondary | ICD-10-CM

## 2021-08-06 DIAGNOSIS — F419 Anxiety disorder, unspecified: Secondary | ICD-10-CM

## 2021-08-14 ENCOUNTER — Other Ambulatory Visit (HOSPITAL_COMMUNITY): Payer: Self-pay

## 2021-08-14 ENCOUNTER — Encounter: Payer: Self-pay | Admitting: Adult Health

## 2021-08-14 ENCOUNTER — Ambulatory Visit (INDEPENDENT_AMBULATORY_CARE_PROVIDER_SITE_OTHER): Payer: BC Managed Care – PPO | Admitting: Adult Health

## 2021-08-14 ENCOUNTER — Other Ambulatory Visit: Payer: Self-pay

## 2021-08-14 VITALS — BP 122/82 | HR 95 | Temp 97.5°F | Wt 295.0 lb

## 2021-08-14 DIAGNOSIS — Z79899 Other long term (current) drug therapy: Secondary | ICD-10-CM

## 2021-08-14 DIAGNOSIS — E559 Vitamin D deficiency, unspecified: Secondary | ICD-10-CM | POA: Diagnosis not present

## 2021-08-14 DIAGNOSIS — E782 Mixed hyperlipidemia: Secondary | ICD-10-CM | POA: Diagnosis not present

## 2021-08-14 DIAGNOSIS — E538 Deficiency of other specified B group vitamins: Secondary | ICD-10-CM

## 2021-08-14 DIAGNOSIS — F419 Anxiety disorder, unspecified: Secondary | ICD-10-CM

## 2021-08-14 DIAGNOSIS — I10 Essential (primary) hypertension: Secondary | ICD-10-CM

## 2021-08-14 DIAGNOSIS — R7309 Other abnormal glucose: Secondary | ICD-10-CM | POA: Diagnosis not present

## 2021-08-14 MED ORDER — MOUNJARO 12.5 MG/0.5ML ~~LOC~~ SOAJ
12.5000 mg | SUBCUTANEOUS | 0 refills | Status: DC
  Filled 2021-08-14 – 2021-08-21 (×5): qty 2, 28d supply, fill #0

## 2021-08-14 NOTE — Patient Instructions (Addendum)
° ° ° °  [email or web address] [place], [place], [place] [address]  [phone]   Suggest checking we[employer] website for sample letter petitioning HR for weight loss med coverage letter - share with your coworkers!
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
## 2021-08-15 ENCOUNTER — Other Ambulatory Visit: Payer: Self-pay | Admitting: Adult Health

## 2021-08-15 ENCOUNTER — Other Ambulatory Visit (HOSPITAL_COMMUNITY): Payer: Self-pay

## 2021-08-15 ENCOUNTER — Encounter: Payer: Self-pay | Admitting: Adult Health

## 2021-08-15 LAB — CBC WITH DIFFERENTIAL/PLATELET
Absolute Monocytes: 490 cells/uL (ref 200–950)
Basophils Absolute: 30 cells/uL (ref 0–200)
Basophils Relative: 0.5 %
Eosinophils Absolute: 47 cells/uL (ref 15–500)
Eosinophils Relative: 0.8 %
HCT: 40.2 % (ref 35.0–45.0)
Hemoglobin: 13.4 g/dL (ref 11.7–15.5)
Lymphs Abs: 2584 cells/uL (ref 850–3900)
MCH: 29.8 pg (ref 27.0–33.0)
MCHC: 33.3 g/dL (ref 32.0–36.0)
MCV: 89.5 fL (ref 80.0–100.0)
MPV: 10.7 fL (ref 7.5–12.5)
Monocytes Relative: 8.3 %
Neutro Abs: 2749 cells/uL (ref 1500–7800)
Neutrophils Relative %: 46.6 %
Platelets: 310 10*3/uL (ref 140–400)
RBC: 4.49 10*6/uL (ref 3.80–5.10)
RDW: 13.7 % (ref 11.0–15.0)
Total Lymphocyte: 43.8 %
WBC: 5.9 10*3/uL (ref 3.8–10.8)

## 2021-08-15 LAB — COMPLETE METABOLIC PANEL WITH GFR
AG Ratio: 1.4 (calc) (ref 1.0–2.5)
ALT: 11 U/L (ref 6–29)
AST: 19 U/L (ref 10–35)
Albumin: 4.2 g/dL (ref 3.6–5.1)
Alkaline phosphatase (APISO): 62 U/L (ref 37–153)
BUN: 9 mg/dL (ref 7–25)
CO2: 26 mmol/L (ref 20–32)
Calcium: 9.6 mg/dL (ref 8.6–10.4)
Chloride: 103 mmol/L (ref 98–110)
Creat: 0.91 mg/dL (ref 0.50–1.03)
Globulin: 3.1 g/dL (calc) (ref 1.9–3.7)
Glucose, Bld: 81 mg/dL (ref 65–99)
Potassium: 3.9 mmol/L (ref 3.5–5.3)
Sodium: 139 mmol/L (ref 135–146)
Total Bilirubin: 0.8 mg/dL (ref 0.2–1.2)
Total Protein: 7.3 g/dL (ref 6.1–8.1)
eGFR: 76 mL/min/{1.73_m2} (ref >=60)

## 2021-08-15 LAB — VITAMIN D 25 HYDROXY (VIT D DEFICIENCY, FRACTURES): Vit D, 25-Hydroxy: 79 ng/mL (ref 30–100)

## 2021-08-15 LAB — LIPID PANEL
Cholesterol: 207 mg/dL — ABNORMAL HIGH (ref <200)
HDL: 50 mg/dL (ref >=50)
LDL Cholesterol (Calc): 137 mg/dL (calc) — ABNORMAL HIGH
Non-HDL Cholesterol (Calc): 157 mg/dL (calc) — ABNORMAL HIGH (ref <130)
Total CHOL/HDL Ratio: 4.1 (calc) (ref <5.0)
Triglycerides: 101 mg/dL (ref <150)

## 2021-08-15 LAB — HEMOGLOBIN A1C
Hgb A1c MFr Bld: 5.4 % of total Hgb (ref <5.7)
Mean Plasma Glucose: 108 mg/dL
eAG (mmol/L): 6 mmol/L

## 2021-08-15 LAB — TSH: TSH: 2.29 mIU/L

## 2021-08-15 LAB — MAGNESIUM: Magnesium: 2.2 mg/dL (ref 1.5–2.5)

## 2021-08-15 MED ORDER — ALPRAZOLAM 0.5 MG PO TABS: 0.5000 mg | ORAL_TABLET | Freq: Two times a day (BID) | ORAL | 0 refills | Status: DC | PRN

## 2021-08-15 NOTE — Progress Notes (Signed)
Future Appointments  Date Time Provider [place]  05/01/2022 10:00 AM Liane Comber, NP [facility] None    PDMP reviewed for alprazolam refill request.

## 2021-08-19 ENCOUNTER — Other Ambulatory Visit (HOSPITAL_COMMUNITY): Payer: Self-pay

## 2021-08-21 ENCOUNTER — Other Ambulatory Visit: Payer: Self-pay | Admitting: Adult Health

## 2021-08-21 ENCOUNTER — Other Ambulatory Visit (HOSPITAL_COMMUNITY): Payer: Self-pay

## 2021-08-21 MED ORDER — MOUNJARO 12.5 MG/0.5ML ~~LOC~~ SOAJ: 12.5000 mg | SUBCUTANEOUS | 0 refills | Status: DC

## 2021-09-05 ENCOUNTER — Other Ambulatory Visit: Payer: Self-pay | Admitting: Adult Health

## 2021-09-05 DIAGNOSIS — Z1231 Encounter for screening mammogram for malignant neoplasm of breast: Secondary | ICD-10-CM

## 2021-09-11 ENCOUNTER — Other Ambulatory Visit (HOSPITAL_COMMUNITY): Payer: Self-pay

## 2021-09-17 ENCOUNTER — Ambulatory Visit
Admission: RE | Admit: 2021-09-17 | Discharge: 2021-09-17 | Disposition: A | Discharge status: Home / Self Care | Payer: BC Managed Care – PPO | Source: Ambulatory Visit | Attending: Adult Health | Admitting: Adult Health

## 2021-09-17 DIAGNOSIS — Z1231 Encounter for screening mammogram for malignant neoplasm of breast: Secondary | ICD-10-CM

## 2021-09-23 ENCOUNTER — Other Ambulatory Visit: Payer: Self-pay | Admitting: Adult Health

## 2021-09-24 ENCOUNTER — Other Ambulatory Visit: Payer: Self-pay | Admitting: Adult Health

## 2021-09-27 ENCOUNTER — Other Ambulatory Visit: Payer: Self-pay | Admitting: Nurse Practitioner

## 2021-11-20 ENCOUNTER — Ambulatory Visit: Payer: BC Managed Care – PPO | Admitting: Adult Health
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
## 2021-12-02 ENCOUNTER — Ambulatory Visit (INDEPENDENT_AMBULATORY_CARE_PROVIDER_SITE_OTHER): Payer: BC Managed Care – PPO | Admitting: Adult Health

## 2021-12-02 ENCOUNTER — Encounter: Payer: Self-pay | Admitting: Adult Health

## 2021-12-02 DIAGNOSIS — R7309 Other abnormal glucose: Secondary | ICD-10-CM

## 2021-12-02 DIAGNOSIS — I10 Essential (primary) hypertension: Secondary | ICD-10-CM

## 2021-12-02 DIAGNOSIS — E782 Mixed hyperlipidemia: Secondary | ICD-10-CM

## 2021-12-02 DIAGNOSIS — E538 Deficiency of other specified B group vitamins: Secondary | ICD-10-CM

## 2021-12-02 DIAGNOSIS — E559 Vitamin D deficiency, unspecified: Secondary | ICD-10-CM

## 2021-12-04 ENCOUNTER — Other Ambulatory Visit: Payer: Self-pay | Admitting: Nurse Practitioner

## 2021-12-04 DIAGNOSIS — E559 Vitamin D deficiency, unspecified: Secondary | ICD-10-CM

## 2021-12-18 ENCOUNTER — Other Ambulatory Visit: Payer: Self-pay | Admitting: Adult Health

## 2021-12-19 ENCOUNTER — Other Ambulatory Visit: Payer: Self-pay | Admitting: Adult Health

## 2022-01-04 ENCOUNTER — Telehealth: Payer: BC Managed Care – PPO | Admitting: Physician Assistant

## 2022-01-04 DIAGNOSIS — R3989 Other symptoms and signs involving the genitourinary system: Secondary | ICD-10-CM

## 2022-01-05 MED ORDER — CEPHALEXIN 500 MG PO CAPS: 500.0000 mg | ORAL_CAPSULE | Freq: Two times a day (BID) | ORAL | 0 refills | Status: DC

## 2022-01-05 NOTE — Progress Notes (Signed)

## 2022-01-15 ENCOUNTER — Encounter: Payer: BC Managed Care – PPO | Admitting: Adult Health

## 2022-02-05 ENCOUNTER — Encounter: Payer: Self-pay | Admitting: Radiology

## 2022-02-05 ENCOUNTER — Telehealth: Payer: Self-pay | Admitting: Nurse Practitioner

## 2022-02-05 ENCOUNTER — Ambulatory Visit: Payer: BC Managed Care – PPO | Admitting: Radiology

## 2022-02-05 ENCOUNTER — Ambulatory Visit: Payer: BC Managed Care – PPO | Admitting: Obstetrics and Gynecology

## 2022-02-05 VITALS — BP 120/78

## 2022-02-05 DIAGNOSIS — N952 Postmenopausal atrophic vaginitis: Secondary | ICD-10-CM | POA: Diagnosis not present

## 2022-02-05 DIAGNOSIS — Z113 Encounter for screening for infections with a predominantly sexual mode of transmission: Secondary | ICD-10-CM

## 2022-02-05 DIAGNOSIS — N949 Unspecified condition associated with female genital organs and menstrual cycle: Secondary | ICD-10-CM

## 2022-02-05 LAB — WET PREP FOR TRICH, YEAST, CLUE

## 2022-02-05 MED ORDER — ESTRADIOL 10 MCG VA TABS: 1.0000 | ORAL_TABLET | VAGINAL | 4 refills | Status: DC

## 2022-02-05 NOTE — Progress Notes (Signed)
      Subjective: Mary Banks is a 51 y.o. female who complains of  vaginal, vulvar pain, noticed 3-4 weeks after having intercourse---abstained x's 5 years (used Monistat over 1 week ago with some relief and symptoms returned) Elects for STI screen today   - Sexually active with 1 new female partner(s) - Last sexual encounter: 5 days ago - Contraception: hyst  Review of Systems  All other systems reviewed and are negative.      02/05/2022    3:14 PM 12/02/2021   10:47 AM 08/14/2021    4:23 PM  Vitals with BMI  Weight  288 lbs 707 lbs  Systolic [phone]  Diastolic 78 80 82  Pulse  95 95     Objective:  -Vulva: without lesions or discharge -Vagina: discharge present, aptima swab and wet prep obtained -Cervix/Uterus: surgically absent -Perineum: no lesions  -Adnexa: no masses or tenderness   Microscopic wet-mount exam shows negative for pathogens, normal epithelial cells.   Chaperone offered and declined.  Assessment:/Plan:   1. Vaginal discomfort R/t atrophy - WET PREP FOR TRICH, YEAST, CLUE  2. Vaginal atrophy  - Estradiol (YUVAFEM) 10 MCG TABS vaginal tablet; Place 1 tablet (10 mcg total) vaginally 2 (two) times a week.  Dispense: 24 tablet; Refill: 4  3. Screening for venereal disease  - SURESWAB CT/NG/T. vaginalis    Will contact patient with results of testing completed today. Avoid intercourse until symptoms are resolved. Safe sex encouraged. Avoid the use of soaps or perfumed products in the peri area. Avoid tub baths and sitting in sweaty or wet clothing for prolonged periods of time.

## 2022-02-05 NOTE — Telephone Encounter (Signed)
Please call patient to discuss this with her

## 2022-02-05 NOTE — Telephone Encounter (Signed)
Pharmacy said that Mary Banks is no longer a part of [facility] and will need to request an alternative. She said she was under the impression that it was just added to their formulary, so she is wanting to know what she has to do from here.

## 2022-02-06 LAB — SURESWAB CT/NG/T. VAGINALIS
C. trachomatis RNA, TMA: NOT DETECTED
N. gonorrhoeae RNA, TMA: NOT DETECTED
Trichomonas vaginalis RNA: NOT DETECTED
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
## 2022-02-09 ENCOUNTER — Encounter: Payer: Self-pay | Admitting: Nurse Practitioner

## 2022-02-09 ENCOUNTER — Ambulatory Visit (INDEPENDENT_AMBULATORY_CARE_PROVIDER_SITE_OTHER): Payer: BC Managed Care – PPO | Admitting: Nurse Practitioner

## 2022-02-09 VITALS — BP 120/80 | HR 87 | Temp 97.5°F | Ht 65.0 in | Wt 297.4 lb

## 2022-02-09 DIAGNOSIS — I10 Essential (primary) hypertension: Secondary | ICD-10-CM | POA: Diagnosis not present

## 2022-02-09 DIAGNOSIS — R7309 Other abnormal glucose: Secondary | ICD-10-CM | POA: Diagnosis not present

## 2022-02-09 DIAGNOSIS — Z6841 Body Mass Index (BMI) 40.0 and over, adult: Secondary | ICD-10-CM | POA: Diagnosis not present

## 2022-02-09 MED ORDER — OZEMPIC (2 MG/DOSE) 8 MG/3ML ~~LOC~~ SOPN: 2.0000 mg | PEN_INJECTOR | SUBCUTANEOUS | 3 refills | Status: DC

## 2022-02-12 ENCOUNTER — Telehealth: Payer: Self-pay

## 2022-02-12 NOTE — Telephone Encounter (Signed)
Prior auth for Ozempic denied.

## 2022-03-05 ENCOUNTER — Ambulatory Visit: Payer: BC Managed Care – PPO | Admitting: Nurse Practitioner

## 2022-03-13 ENCOUNTER — Other Ambulatory Visit: Payer: Self-pay | Admitting: Internal Medicine

## 2022-03-13 ENCOUNTER — Encounter: Payer: Self-pay | Admitting: Internal Medicine

## 2022-03-13 DIAGNOSIS — K120 Recurrent oral aphthae: Secondary | ICD-10-CM

## 2022-03-13 MED ORDER — ACYCLOVIR 800 MG PO TABS: ORAL_TABLET | ORAL | 0 refills | Status: DC

## 2022-04-16 ENCOUNTER — Other Ambulatory Visit: Payer: Self-pay | Admitting: Internal Medicine

## 2022-04-16 DIAGNOSIS — K120 Recurrent oral aphthae: Secondary | ICD-10-CM

## 2022-05-01 ENCOUNTER — Encounter: Payer: Self-pay | Admitting: Nurse Practitioner

## 2022-05-01 ENCOUNTER — Ambulatory Visit (INDEPENDENT_AMBULATORY_CARE_PROVIDER_SITE_OTHER): Payer: BC Managed Care – PPO | Admitting: Nurse Practitioner

## 2022-05-01 VITALS — BP 122/80 | HR 74 | Temp 97.5°F | Ht 65.0 in | Wt 304.0 lb

## 2022-05-01 DIAGNOSIS — R7309 Other abnormal glucose: Secondary | ICD-10-CM

## 2022-05-01 DIAGNOSIS — Z79899 Other long term (current) drug therapy: Secondary | ICD-10-CM

## 2022-05-01 DIAGNOSIS — Z136 Encounter for screening for cardiovascular disorders: Secondary | ICD-10-CM

## 2022-05-01 DIAGNOSIS — Z13220 Encounter for screening for lipoid disorders: Secondary | ICD-10-CM | POA: Diagnosis not present

## 2022-05-01 DIAGNOSIS — Z1329 Encounter for screening for other suspected endocrine disorder: Secondary | ICD-10-CM

## 2022-05-01 DIAGNOSIS — I10 Essential (primary) hypertension: Secondary | ICD-10-CM

## 2022-05-01 DIAGNOSIS — Z0000 Encounter for general adult medical examination without abnormal findings: Secondary | ICD-10-CM | POA: Diagnosis not present

## 2022-05-01 DIAGNOSIS — E559 Vitamin D deficiency, unspecified: Secondary | ICD-10-CM

## 2022-05-01 DIAGNOSIS — F419 Anxiety disorder, unspecified: Secondary | ICD-10-CM

## 2022-05-01 DIAGNOSIS — Z131 Encounter for screening for diabetes mellitus: Secondary | ICD-10-CM

## 2022-05-01 DIAGNOSIS — Z1389 Encounter for screening for other disorder: Secondary | ICD-10-CM | POA: Diagnosis not present

## 2022-05-01 DIAGNOSIS — Z90710 Acquired absence of both cervix and uterus: Secondary | ICD-10-CM

## 2022-05-01 DIAGNOSIS — E782 Mixed hyperlipidemia: Secondary | ICD-10-CM

## 2022-05-01 DIAGNOSIS — Z23 Encounter for immunization: Secondary | ICD-10-CM

## 2022-05-01 DIAGNOSIS — Z0001 Encounter for general adult medical examination with abnormal findings: Secondary | ICD-10-CM

## 2022-05-01 MED ORDER — BUSPIRONE HCL 5 MG PO TABS: ORAL_TABLET | ORAL | 0 refills | Status: DC

## 2022-05-01 MED ORDER — PHENTERMINE HCL 37.5 MG PO TABS: ORAL_TABLET | ORAL | 2 refills | Status: DC

## 2022-05-01 MED ORDER — ALPRAZOLAM 0.5 MG PO TABS: 0.5000 mg | ORAL_TABLET | Freq: Two times a day (BID) | ORAL | 0 refills | Status: DC | PRN

## 2022-05-01 MED ORDER — WEGOVY 0.25 MG/0.5ML ~~LOC~~ SOAJ: 0.2500 mg | SUBCUTANEOUS | 0 refills | Status: DC
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
## 2022-05-01 NOTE — Patient Instructions (Signed)

## 2022-05-04 LAB — LIPID PANEL
Cholesterol: 220 mg/dL — ABNORMAL HIGH (ref <200)
HDL: 64 mg/dL (ref >=50)
LDL Cholesterol (Calc): 135 mg/dL (calc) — ABNORMAL HIGH
Non-HDL Cholesterol (Calc): 156 mg/dL (calc) — ABNORMAL HIGH (ref <130)
Total CHOL/HDL Ratio: 3.4 (calc) (ref <5.0)
Triglycerides: 100 mg/dL (ref <150)

## 2022-05-04 LAB — URINALYSIS, ROUTINE W REFLEX MICROSCOPIC
Bilirubin Urine: NEGATIVE
Glucose, UA: NEGATIVE
Hgb urine dipstick: NEGATIVE
Ketones, ur: NEGATIVE
Leukocytes,Ua: NEGATIVE
Nitrite: NEGATIVE
Protein, ur: NEGATIVE
Specific Gravity, Urine: 1.02 (ref 1.001–1.035)
pH: 8 (ref 5.0–8.0)

## 2022-05-04 LAB — COMPLETE METABOLIC PANEL WITH GFR
AG Ratio: 1.5 (calc) (ref 1.0–2.5)
ALT: 14 U/L (ref 6–29)
AST: 21 U/L (ref 10–35)
Albumin: 4.3 g/dL (ref 3.6–5.1)
Alkaline phosphatase (APISO): 59 U/L (ref 37–153)
BUN: 11 mg/dL (ref 7–25)
CO2: 31 mmol/L (ref 20–32)
Calcium: 9.5 mg/dL (ref 8.6–10.4)
Chloride: 103 mmol/L (ref 98–110)
Creat: 0.83 mg/dL (ref 0.50–1.03)
Globulin: 2.9 g/dL (calc) (ref 1.9–3.7)
Glucose, Bld: 89 mg/dL (ref 65–99)
Potassium: 4.4 mmol/L (ref 3.5–5.3)
Sodium: 140 mmol/L (ref 135–146)
Total Bilirubin: 0.6 mg/dL (ref 0.2–1.2)
Total Protein: 7.2 g/dL (ref 6.1–8.1)
eGFR: 85 mL/min/{1.73_m2} (ref >=60)

## 2022-05-04 LAB — CBC WITH DIFFERENTIAL/PLATELET
Absolute Monocytes: 491 cells/uL (ref 200–950)
Basophils Absolute: 32 cells/uL (ref 0–200)
Basophils Relative: 0.6 %
Eosinophils Absolute: 49 cells/uL (ref 15–500)
Eosinophils Relative: 0.9 %
HCT: 39.9 % (ref 35.0–45.0)
Hemoglobin: 13.5 g/dL (ref 11.7–15.5)
Lymphs Abs: 2516 cells/uL (ref 850–3900)
MCH: 31.8 pg (ref 27.0–33.0)
MCHC: 33.8 g/dL (ref 32.0–36.0)
MCV: 94.1 fL (ref 80.0–100.0)
MPV: 10.3 fL (ref 7.5–12.5)
Monocytes Relative: 9.1 %
Neutro Abs: 2311 cells/uL (ref 1500–7800)
Neutrophils Relative %: 42.8 %
Platelets: 288 10*3/uL (ref 140–400)
RBC: 4.24 10*6/uL (ref 3.80–5.10)
RDW: 13.1 % (ref 11.0–15.0)
Total Lymphocyte: 46.6 %
WBC: 5.4 10*3/uL (ref 3.8–10.8)

## 2022-05-04 LAB — MAGNESIUM: Magnesium: 2.1 mg/dL (ref 1.5–2.5)

## 2022-05-04 LAB — TSH: TSH: 1.63 mIU/L

## 2022-05-04 LAB — HEMOGLOBIN A1C
Hgb A1c MFr Bld: 5.8 % of total Hgb — ABNORMAL HIGH (ref <5.7)
Mean Plasma Glucose: 120 mg/dL
eAG (mmol/L): 6.6 mmol/L

## 2022-05-04 LAB — MICROALBUMIN / CREATININE URINE RATIO
Creatinine, Urine: 134 mg/dL (ref 20–275)
Microalb, Ur: <0.2 mg/dL

## 2022-05-04 LAB — INSULIN, RANDOM: Insulin: 13.3 u[IU]/mL

## 2022-05-04 LAB — VITAMIN D 25 HYDROXY (VIT D DEFICIENCY, FRACTURES): Vit D, 25-Hydroxy: 75 ng/mL (ref 30–100)

## 2022-05-05 ENCOUNTER — Telehealth: Payer: Self-pay | Admitting: Nurse Practitioner

## 2022-05-05 ENCOUNTER — Encounter: Payer: Self-pay | Admitting: Nurse Practitioner

## 2022-05-05 DIAGNOSIS — R7309 Other abnormal glucose: Secondary | ICD-10-CM

## 2022-05-05 DIAGNOSIS — E782 Mixed hyperlipidemia: Secondary | ICD-10-CM

## 2022-05-05 MED ORDER — PHENTERMINE HCL 37.5 MG PO TABS: ORAL_TABLET | ORAL | 2 refills | Status: DC

## 2022-05-05 MED ORDER — ALPRAZOLAM 0.5 MG PO TABS: 0.5000 mg | ORAL_TABLET | Freq: Two times a day (BID) | ORAL | 0 refills | Status: DC | PRN

## 2022-05-05 MED ORDER — LOSARTAN POTASSIUM-HCTZ 100-25 MG PO TABS: ORAL_TABLET | ORAL | 0 refills | Status: DC
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
## 2022-05-06 MED ORDER — BUSPIRONE HCL 5 MG PO TABS: ORAL_TABLET | ORAL | 0 refills | Status: DC

## 2022-05-06 MED ORDER — WEGOVY 0.25 MG/0.5ML ~~LOC~~ SOAJ: 0.2500 mg | SUBCUTANEOUS | 0 refills | Status: DC

## 2022-06-04 ENCOUNTER — Telehealth: Payer: BC Managed Care – PPO | Admitting: Physician Assistant

## 2022-06-04 DIAGNOSIS — J019 Acute sinusitis, unspecified: Secondary | ICD-10-CM

## 2022-06-04 DIAGNOSIS — B9689 Other specified bacterial agents as the cause of diseases classified elsewhere: Secondary | ICD-10-CM

## 2022-06-04 MED ORDER — AMOXICILLIN-POT CLAVULANATE 875-125 MG PO TABS: 1.0000 | ORAL_TABLET | Freq: Two times a day (BID) | ORAL | 0 refills | Status: DC

## 2022-06-04 NOTE — Progress Notes (Signed)
I have spent 5 minutes in review of e-visit questionnaire, review and updating patient chart, medical decision making and response to patient.   Efstathios Sawin Cody Nimrat Woolworth, PA-C    

## 2022-06-04 NOTE — Progress Notes (Signed)

## 2022-07-09 ENCOUNTER — Encounter: Payer: Self-pay | Admitting: Nurse Practitioner

## 2022-07-09 ENCOUNTER — Other Ambulatory Visit: Payer: Self-pay | Admitting: Nurse Practitioner

## 2022-07-09 ENCOUNTER — Other Ambulatory Visit (HOSPITAL_BASED_OUTPATIENT_CLINIC_OR_DEPARTMENT_OTHER): Payer: Self-pay

## 2022-07-09 MED ORDER — WEGOVY 1 MG/0.5ML ~~LOC~~ SOAJ
1.0000 mg | SUBCUTANEOUS | 3 refills | Status: DC
  Filled 2022-07-09: qty 2, 28d supply, fill #0

## 2022-07-09 MED ORDER — WEGOVY 1 MG/0.5ML ~~LOC~~ SOAJ
1.0000 mg | SUBCUTANEOUS | 3 refills | Status: DC
  Filled 2022-07-09: qty 2, fill #0

## 2022-07-10 ENCOUNTER — Other Ambulatory Visit (HOSPITAL_BASED_OUTPATIENT_CLINIC_OR_DEPARTMENT_OTHER): Payer: Self-pay

## 2022-07-20 ENCOUNTER — Other Ambulatory Visit (HOSPITAL_BASED_OUTPATIENT_CLINIC_OR_DEPARTMENT_OTHER): Payer: Self-pay

## 2022-08-01 ENCOUNTER — Other Ambulatory Visit: Payer: Self-pay | Admitting: Nurse Practitioner

## 2022-08-03 ENCOUNTER — Other Ambulatory Visit: Payer: Self-pay | Admitting: Nurse Practitioner

## 2022-08-03 ENCOUNTER — Encounter: Payer: Self-pay | Admitting: Nurse Practitioner

## 2022-08-03 MED ORDER — PHENTERMINE HCL 37.5 MG PO TABS: ORAL_TABLET | ORAL | 0 refills | Status: DC

## 2022-08-04 MED ORDER — ALPRAZOLAM 0.5 MG PO TABS: 0.5000 mg | ORAL_TABLET | Freq: Two times a day (BID) | ORAL | 0 refills | Status: DC | PRN

## 2022-09-01 ENCOUNTER — Encounter: Payer: BC Managed Care – PPO | Admitting: Nurse Practitioner

## 2022-09-01 ENCOUNTER — Ambulatory Visit (INDEPENDENT_AMBULATORY_CARE_PROVIDER_SITE_OTHER): Payer: BC Managed Care – PPO | Admitting: Nurse Practitioner

## 2022-09-01 VITALS — BP 152/80 | HR 78 | Temp 97.3°F | Ht 65.0 in | Wt 320.0 lb

## 2022-09-01 DIAGNOSIS — R7309 Other abnormal glucose: Secondary | ICD-10-CM

## 2022-09-01 DIAGNOSIS — F419 Anxiety disorder, unspecified: Secondary | ICD-10-CM

## 2022-09-01 DIAGNOSIS — Z79899 Other long term (current) drug therapy: Secondary | ICD-10-CM

## 2022-09-01 DIAGNOSIS — Z6841 Body Mass Index (BMI) 40.0 and over, adult: Secondary | ICD-10-CM

## 2022-09-01 DIAGNOSIS — E782 Mixed hyperlipidemia: Secondary | ICD-10-CM

## 2022-09-01 DIAGNOSIS — K591 Functional diarrhea: Secondary | ICD-10-CM

## 2022-09-01 DIAGNOSIS — I10 Essential (primary) hypertension: Secondary | ICD-10-CM | POA: Diagnosis not present

## 2022-09-01 MED ORDER — ZEPBOUND 2.5 MG/0.5ML ~~LOC~~ SOAJ: 2.5000 mg | SUBCUTANEOUS | 2 refills | Status: DC

## 2022-09-01 NOTE — Progress Notes (Signed)
Mary Banks,  We would recommend a follow up with your primary care. Likely you are experiencing diarrhea from the antibiotic, but if this persists you will need to have stool samples performed to rule out other causes of diarrhea.   While awaiting your primary care appointment you may try an over the counter probiotic to help with symptoms.  Increase your water to assure you are not getting dehydrated, your urine should be clear to pale yellow. Darker urine is a sign of dehydration.   If you are unable to see your primary care doctor this week you can visit one of these in person urgent care locations for additional assistance:  NOTE: There will be NO CHARGE for this eVisit   If you are having a true medical emergency please call 911.      For an urgent face to face visit, [facility] has eight urgent care centers for your convenience:   NEW!! [facility] [place] [facility] [place] Get Driving Directions [id number] [address] [place], [place] [facility] [place] [facility] [place] Get Driving Directions [id number] [place], [place] [address]   [place] [facility]) Get Driving Directions [id number] [address], [place] [address]  [place] [facility] [place] ([place]) Get Driving Directions [id number] [address] [place],  [place]  [address]  [place] [facility] - at [address] Get Driving Directions  [phone] [address] [place],  [place] [address]   [facility] Get Driving Directions [id number] [address] [place], [place], [place] [address]   [facility] Get Driving Directions  [id number] [address] [place], [place] [address]   [facility] [place] Get Driving  Directions [id number] [address] [place], [place] [address]  Your MyChart E-visit questionnaire answers were reviewed by a board certified advanced clinical practitioner to complete your personal care plan based on your specific symptoms.  Thank you for using e-Visits.
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
## 2022-09-02 ENCOUNTER — Encounter: Payer: Self-pay | Admitting: Nurse Practitioner

## 2022-09-02 LAB — COMPLETE METABOLIC PANEL WITH GFR
AG Ratio: 1.6 (calc) (ref 1.0–2.5)
ALT: 15 U/L (ref 6–29)
AST: 21 U/L (ref 10–35)
Albumin: 4.3 g/dL (ref 3.6–5.1)
Alkaline phosphatase (APISO): 64 U/L (ref 37–153)
BUN: 14 mg/dL (ref 7–25)
CO2: 29 mmol/L (ref 20–32)
Calcium: 9.3 mg/dL (ref 8.6–10.4)
Chloride: 105 mmol/L (ref 98–110)
Creat: 0.87 mg/dL (ref 0.50–1.03)
Globulin: 2.7 g/dL (calc) (ref 1.9–3.7)
Glucose, Bld: 90 mg/dL (ref 65–99)
Potassium: 4.3 mmol/L (ref 3.5–5.3)
Sodium: 142 mmol/L (ref 135–146)
Total Bilirubin: 0.4 mg/dL (ref 0.2–1.2)
Total Protein: 7 g/dL (ref 6.1–8.1)
eGFR: 80 mL/min/{1.73_m2} (ref >=60)

## 2022-09-02 LAB — LIPID PANEL
Cholesterol: 221 mg/dL — ABNORMAL HIGH (ref <200)
HDL: 63 mg/dL (ref >=50)
LDL Cholesterol (Calc): 127 mg/dL (calc) — ABNORMAL HIGH
Non-HDL Cholesterol (Calc): 158 mg/dL (calc) — ABNORMAL HIGH (ref <130)
Total CHOL/HDL Ratio: 3.5 (calc) (ref <5.0)
Triglycerides: 190 mg/dL — ABNORMAL HIGH (ref <150)

## 2022-09-02 LAB — CBC WITH DIFFERENTIAL/PLATELET
Absolute Monocytes: 595 cells/uL (ref 200–950)
Basophils Absolute: 28 cells/uL (ref 0–200)
Basophils Relative: 0.4 %
Eosinophils Absolute: 70 cells/uL (ref 15–500)
Eosinophils Relative: 1 %
HCT: 38.8 % (ref 35.0–45.0)
Hemoglobin: 13 g/dL (ref 11.7–15.5)
Lymphs Abs: 2646 cells/uL (ref 850–3900)
MCH: 31.3 pg (ref 27.0–33.0)
MCHC: 33.5 g/dL (ref 32.0–36.0)
MCV: 93.3 fL (ref 80.0–100.0)
MPV: 10.7 fL (ref 7.5–12.5)
Monocytes Relative: 8.5 %
Neutro Abs: 3661 cells/uL (ref 1500–7800)
Neutrophils Relative %: 52.3 %
Platelets: 305 10*3/uL (ref 140–400)
RBC: 4.16 10*6/uL (ref 3.80–5.10)
RDW: 12.8 % (ref 11.0–15.0)
Total Lymphocyte: 37.8 %
WBC: 7 10*3/uL (ref 3.8–10.8)

## 2022-09-02 LAB — HEMOGLOBIN A1C
Hgb A1c MFr Bld: 5.9 % of total Hgb — ABNORMAL HIGH (ref <5.7)
Mean Plasma Glucose: 123 mg/dL
eAG (mmol/L): 6.8 mmol/L

## 2022-09-16 ENCOUNTER — Other Ambulatory Visit: Payer: Self-pay | Admitting: Nurse Practitioner

## 2022-09-23 ENCOUNTER — Telehealth: Payer: BC Managed Care – PPO | Admitting: Physician Assistant

## 2022-09-23 DIAGNOSIS — R3989 Other symptoms and signs involving the genitourinary system: Secondary | ICD-10-CM | POA: Diagnosis not present

## 2022-09-23 MED ORDER — CEPHALEXIN 500 MG PO CAPS: 500.0000 mg | ORAL_CAPSULE | Freq: Two times a day (BID) | ORAL | 0 refills | Status: DC

## 2022-09-23 NOTE — Progress Notes (Signed)

## 2022-11-02 ENCOUNTER — Ambulatory Visit: Payer: BC Managed Care – PPO | Admitting: Nurse Practitioner

## 2022-11-12 ENCOUNTER — Other Ambulatory Visit: Payer: Self-pay | Admitting: Nurse Practitioner

## 2022-11-12 DIAGNOSIS — E559 Vitamin D deficiency, unspecified: Secondary | ICD-10-CM

## 2022-11-17 ENCOUNTER — Encounter: Payer: Self-pay | Admitting: Nurse Practitioner

## 2022-11-17 ENCOUNTER — Ambulatory Visit (INDEPENDENT_AMBULATORY_CARE_PROVIDER_SITE_OTHER): Payer: BC Managed Care – PPO | Admitting: Nurse Practitioner

## 2022-11-17 VITALS — BP 122/80 | HR 86 | Temp 97.7°F | Ht 65.0 in | Wt 323.2 lb

## 2022-11-17 DIAGNOSIS — Z79899 Other long term (current) drug therapy: Secondary | ICD-10-CM

## 2022-11-17 DIAGNOSIS — F419 Anxiety disorder, unspecified: Secondary | ICD-10-CM

## 2022-11-17 DIAGNOSIS — I10 Essential (primary) hypertension: Secondary | ICD-10-CM | POA: Diagnosis not present

## 2022-11-17 DIAGNOSIS — E782 Mixed hyperlipidemia: Secondary | ICD-10-CM | POA: Diagnosis not present

## 2022-11-17 DIAGNOSIS — R7309 Other abnormal glucose: Secondary | ICD-10-CM | POA: Diagnosis not present

## 2022-11-17 DIAGNOSIS — Z6841 Body Mass Index (BMI) 40.0 and over, adult: Secondary | ICD-10-CM

## 2022-11-17 DIAGNOSIS — Z9229 Personal history of other drug therapy: Secondary | ICD-10-CM
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
## 2022-11-18 LAB — COMPLETE METABOLIC PANEL WITH GFR
AG Ratio: 1.4 (calc) (ref 1.0–2.5)
ALT: 14 U/L (ref 6–29)
AST: 24 U/L (ref 10–35)
Albumin: 4.2 g/dL (ref 3.6–5.1)
Alkaline phosphatase (APISO): 67 U/L (ref 37–153)
BUN: 14 mg/dL (ref 7–25)
CO2: 31 mmol/L (ref 20–32)
Calcium: 9.7 mg/dL (ref 8.6–10.4)
Chloride: 102 mmol/L (ref 98–110)
Creat: 0.85 mg/dL (ref 0.50–1.03)
Globulin: 3.1 g/dL (calc) (ref 1.9–3.7)
Glucose, Bld: 93 mg/dL (ref 65–99)
Potassium: 4.4 mmol/L (ref 3.5–5.3)
Sodium: 140 mmol/L (ref 135–146)
Total Bilirubin: 0.4 mg/dL (ref 0.2–1.2)
Total Protein: 7.3 g/dL (ref 6.1–8.1)
eGFR: 82 mL/min/{1.73_m2} (ref >=60)

## 2022-11-18 LAB — CBC WITH DIFFERENTIAL/PLATELET
Absolute Monocytes: 689 cells/uL (ref 200–950)
Basophils Absolute: 43 cells/uL (ref 0–200)
Basophils Relative: 0.6 %
Eosinophils Absolute: 57 cells/uL (ref 15–500)
Eosinophils Relative: 0.8 %
HCT: 40.1 % (ref 35.0–45.0)
Hemoglobin: 13.6 g/dL (ref 11.7–15.5)
Lymphs Abs: 3074 cells/uL (ref 850–3900)
MCH: 31.7 pg (ref 27.0–33.0)
MCHC: 33.9 g/dL (ref 32.0–36.0)
MCV: 93.5 fL (ref 80.0–100.0)
MPV: 10.7 fL (ref 7.5–12.5)
Monocytes Relative: 9.7 %
Neutro Abs: 3238 cells/uL (ref 1500–7800)
Neutrophils Relative %: 45.6 %
Platelets: 291 10*3/uL (ref 140–400)
RBC: 4.29 10*6/uL (ref 3.80–5.10)
RDW: 12.9 % (ref 11.0–15.0)
Total Lymphocyte: 43.3 %
WBC: 7.1 10*3/uL (ref 3.8–10.8)

## 2022-11-18 LAB — HEMOGLOBIN A1C
Hgb A1c MFr Bld: 5.7 % of total Hgb — ABNORMAL HIGH (ref <5.7)
Mean Plasma Glucose: 117 mg/dL
eAG (mmol/L): 6.5 mmol/L

## 2022-11-18 LAB — LIPID PANEL
Cholesterol: 220 mg/dL — ABNORMAL HIGH (ref <200)
HDL: 59 mg/dL (ref >=50)
LDL Cholesterol (Calc): 133 mg/dL (calc) — ABNORMAL HIGH
Non-HDL Cholesterol (Calc): 161 mg/dL (calc) — ABNORMAL HIGH (ref <130)
Total CHOL/HDL Ratio: 3.7 (calc) (ref <5.0)
Triglycerides: 149 mg/dL (ref <150)

## 2022-11-19 ENCOUNTER — Encounter: Payer: Self-pay | Admitting: Nurse Practitioner

## 2022-11-19 DIAGNOSIS — I10 Essential (primary) hypertension: Secondary | ICD-10-CM

## 2022-11-19 DIAGNOSIS — R7309 Other abnormal glucose: Secondary | ICD-10-CM

## 2022-11-19 DIAGNOSIS — E782 Mixed hyperlipidemia: Secondary | ICD-10-CM

## 2022-11-20 MED ORDER — WEGOVY 0.25 MG/0.5ML ~~LOC~~ SOAJ
0.2500 mg | SUBCUTANEOUS | 1 refills | Status: DC
Start: 2022-11-20 — End: 2023-03-25

## 2022-11-23 DIAGNOSIS — Z9229 Personal history of other drug therapy: Secondary | ICD-10-CM | POA: Insufficient documentation

## 2022-11-23 NOTE — Patient Instructions (Signed)
Calorie Counting for Weight Loss Calories are units of energy. Your body needs a certain number of calories from food to keep going throughout the day. When you eat or drink more calories than your body needs, your body stores the extra calories mostly as fat. When you eat or drink fewer calories than your body needs, your body burns fat to get the energy it needs. Calorie counting means keeping track of how many calories you eat and drink each day. Calorie counting can be helpful if you need to lose weight. If you eat fewer calories than your body needs, you should lose weight. Ask your health care provider what a healthy weight is for you. For calorie counting to work, you will need to eat the right number of calories each day to lose a healthy amount of weight per week. A dietitian can help you figure out how many calories you need in a day and will suggest ways to reach your calorie goal. A healthy amount of weight to lose each week is usually 1-2 lb (0.5-0.9 kg). This usually means that your daily calorie intake should be reduced by 500-750 calories. Eating 1,200-1,500 calories a day can help most women lose weight. Eating 1,500-1,800 calories a day can help most men lose weight. What do I need to know about calorie counting? Work with your health care provider or dietitian to determine how many calories you should get each day. To meet your daily calorie goal, you will need to: Find out how many calories are in each food that you would like to eat. Try to do this before you eat. Decide how much of the food you plan to eat. Keep a food log. Do this by writing down what you ate and how many calories it had. To successfully lose weight, it is important to balance calorie counting with a healthy lifestyle that includes regular activity. Where do I find calorie information?  The number of calories in a food can be found on a Nutrition Facts label. If a food does not have a Nutrition Facts label, try  to look up the calories online or ask your dietitian for help. Remember that calories are listed per serving. If you choose to have more than one serving of a food, you will have to multiply the calories per serving by the number of servings you plan to eat. For example, the label on a package of bread might say that a serving size is 1 slice and that there are 90 calories in a serving. If you eat 1 slice, you will have eaten 90 calories. If you eat 2 slices, you will have eaten 180 calories. How do I keep a food log? After each time that you eat, record the following in your food log as soon as possible: What you ate. Be sure to include toppings, sauces, and other extras on the food. How much you ate. This can be measured in cups, ounces, or number of items. How many calories were in each food and drink. The total number of calories in the food you ate. Keep your food log near you, such as in a pocket-sized notebook or on an app or website on your mobile phone. Some programs will calculate calories for you and show you how many calories you have left to meet your daily goal. What are some portion-control tips? Know how many calories are in a serving. This will help you know how many servings you can have of a certain   food. Use a measuring cup to measure serving sizes. You could also try weighing out portions on a kitchen scale. With time, you will be able to estimate serving sizes for some foods. Take time to put servings of different foods on your favorite plates or in your favorite bowls and cups so you know what a serving looks like. Try not to eat straight from a food's packaging, such as from a bag or box. Eating straight from the package makes it hard to see how much you are eating and can lead to overeating. Put the amount you would like to eat in a cup or on a plate to make sure you are eating the right portion. Use smaller plates, glasses, and bowls for smaller portions and to prevent  overeating. Try not to multitask. For example, avoid watching TV or using your computer while eating. If it is time to eat, sit down at a table and enjoy your food. This will help you recognize when you are full. It will also help you be more mindful of what and how much you are eating. What are tips for following this plan? Reading food labels Check the calorie count compared with the serving size. The serving size may be smaller than what you are used to eating. Check the source of the calories. Try to choose foods that are high in protein, fiber, and vitamins, and low in saturated fat, trans fat, and sodium. Shopping Read nutrition labels while you shop. This will help you make healthy decisions about which foods to buy. Pay attention to nutrition labels for low-fat or fat-free foods. These foods sometimes have the same number of calories or more calories than the full-fat versions. They also often have added sugar, starch, or salt to make up for flavor that was removed with the fat. Make a grocery list of lower-calorie foods and stick to it. Cooking Try to cook your favorite foods in a healthier way. For example, try baking instead of frying. Use low-fat dairy products. Meal planning Use more fruits and vegetables. One-half of your plate should be fruits and vegetables. Include lean proteins, such as chicken, turkey, and fish. Lifestyle Each week, aim to do one of the following: 150 minutes of moderate exercise, such as walking. 75 minutes of vigorous exercise, such as running. General information Know how many calories are in the foods you eat most often. This will help you calculate calorie counts faster. Find a way of tracking calories that works for you. Get creative. Try different apps or programs if writing down calories does not work for you. What foods should I eat?  Eat nutritious foods. It is better to have a nutritious, high-calorie food, such as an avocado, than a food with  few nutrients, such as a bag of potato chips. Use your calories on foods and drinks that will fill you up and will not leave you hungry soon after eating. Examples of foods that fill you up are nuts and nut butters, vegetables, lean proteins, and high-fiber foods such as whole grains. High-fiber foods are foods with more than 5 g of fiber per serving. Pay attention to calories in drinks. Low-calorie drinks include water and unsweetened drinks. The items listed above may not be a complete list of foods and beverages you can eat. Contact a dietitian for more information. What foods should I limit? Limit foods or drinks that are not good sources of vitamins, minerals, or protein or that are high in unhealthy fats. These   include: Candy. Other sweets. Sodas, specialty coffee drinks, alcohol, and juice. The items listed above may not be a complete list of foods and beverages you should avoid. Contact a dietitian for more information. How do I count calories when eating out? Pay attention to portions. Often, portions are much larger when eating out. Try these tips to keep portions smaller: Consider sharing a meal instead of getting your own. If you get your own meal, eat only half of it. Before you start eating, ask for a container and put half of your meal into it. When available, consider ordering smaller portions from the menu instead of full portions. Pay attention to your food and drink choices. Knowing the way food is cooked and what is included with the meal can help you eat fewer calories. If calories are listed on the menu, choose the lower-calorie options. Choose dishes that include vegetables, fruits, whole grains, low-fat dairy products, and lean proteins. Choose items that are boiled, broiled, grilled, or steamed. Avoid items that are buttered, battered, fried, or served with cream sauce. Items labeled as crispy are usually fried, unless stated otherwise. Choose water, low-fat milk,  unsweetened iced tea, or other drinks without added sugar. If you want an alcoholic beverage, choose a lower-calorie option, such as a glass of wine or light beer. Ask for dressings, sauces, and syrups on the side. These are usually high in calories, so you should limit the amount you eat. If you want a salad, choose a garden salad and ask for grilled meats. Avoid extra toppings such as bacon, cheese, or fried items. Ask for the dressing on the side, or ask for olive oil and vinegar or lemon to use as dressing. Estimate how many servings of a food you are given. Knowing serving sizes will help you be aware of how much food you are eating at restaurants. Where to find more information Centers for Disease Control and Prevention: www.cdc.gov [place] Department of Agriculture: myplate.gov Summary Calorie counting means keeping track of how many calories you eat and drink each day. If you eat fewer calories than your body needs, you should lose weight. A healthy amount of weight to lose per week is usually 1-2 lb (0.5-0.9 kg). This usually means reducing your daily calorie intake by 500-750 calories. The number of calories in a food can be found on a Nutrition Facts label. If a food does not have a Nutrition Facts label, try to look up the calories online or ask your dietitian for help. Use smaller plates, glasses, and bowls for smaller portions and to prevent overeating. Use your calories on foods and drinks that will fill you up and not leave you hungry shortly after a meal. This information is not intended to replace advice given to you by your health care provider. Make sure you discuss any questions you have with your health care provider. Document Revised: 08/03/2019 Document Reviewed: 08/03/2019 Elsevier Patient Education  2023 [employer] Inc[employer]  

## 2022-11-25 ENCOUNTER — Other Ambulatory Visit: Payer: Self-pay | Admitting: Adult Health

## 2022-11-25 DIAGNOSIS — Z1231 Encounter for screening mammogram for malignant neoplasm of breast: Secondary | ICD-10-CM

## 2022-12-08 ENCOUNTER — Other Ambulatory Visit: Payer: Self-pay | Admitting: Nurse Practitioner

## 2022-12-08 DIAGNOSIS — Z6841 Body Mass Index (BMI) 40.0 and over, adult: Secondary | ICD-10-CM

## 2022-12-08 MED ORDER — ALPRAZOLAM 0.5 MG PO TABS: 0.5000 mg | ORAL_TABLET | Freq: Two times a day (BID) | ORAL | 0 refills | Status: DC | PRN

## 2022-12-08 MED ORDER — PHENTERMINE HCL 37.5 MG PO TABS
ORAL_TABLET | ORAL | 0 refills | Status: DC
Start: 2022-12-08 — End: 2023-01-19

## 2022-12-15 ENCOUNTER — Ambulatory Visit
Admission: RE | Admit: 2022-12-15 | Discharge: 2022-12-15 | Disposition: A | Discharge status: Home / Self Care | Payer: BC Managed Care – PPO | Source: Ambulatory Visit | Attending: Adult Health | Admitting: Adult Health

## 2022-12-15 DIAGNOSIS — Z1231 Encounter for screening mammogram for malignant neoplasm of breast: Secondary | ICD-10-CM

## 2022-12-16 ENCOUNTER — Other Ambulatory Visit: Payer: Self-pay | Admitting: Nurse Practitioner

## 2023-01-19 ENCOUNTER — Other Ambulatory Visit: Payer: Self-pay | Admitting: Nurse Practitioner

## 2023-02-12 ENCOUNTER — Telehealth: Payer: BC Managed Care – PPO | Admitting: Nurse Practitioner

## 2023-02-12 DIAGNOSIS — J069 Acute upper respiratory infection, unspecified: Secondary | ICD-10-CM | POA: Diagnosis not present

## 2023-02-12 MED ORDER — NAPROXEN 500 MG PO TABS
500.0000 mg | ORAL_TABLET | Freq: Two times a day (BID) | ORAL | 0 refills | Status: AC | PRN
Start: 2023-02-12 — End: 2023-02-22

## 2023-02-12 NOTE — Progress Notes (Signed)
E-Visit for Upper Respiratory Infection   We are sorry you are not feeling well.  Here is how we plan to help!  You may consider taking another COVID test tomorrow as some people have tested negative early, and then positive later in the course of their illness.   Be sure to eat three protein sources a day to assure resolution of your virus, and stay hydrated.  For the pain we will prescribe: Meds ordered this encounter  Medications   naproxen (NAPROSYN) 500 MG tablet    Sig: Take 1 tablet (500 mg total) by mouth 2 (two) times daily as needed for up to 10 days for moderate pain or headache (take with food).    Dispense:  20 tablet    Refill:  0     This should not be taken with any other NSAID (avoid ibuprofen, aleve, advil)   Based on what you have shared with me, it looks like you may have a viral upper respiratory infection.  Upper respiratory infections are caused by a large number of viruses; however, rhinovirus is the most common cause.   Symptoms vary from person to person, with common symptoms including sore throat, cough, fatigue or lack of energy and feeling of general discomfort.  A low-grade fever of up to 100.4 may present, but is often uncommon.  Symptoms vary however, and are closely related to a person's age or underlying illnesses.  The most common symptoms associated with an upper respiratory infection are nasal discharge or congestion, cough, sneezing, headache and pressure in the ears and face.  These symptoms usually persist for about 3 to 10 days, but can last up to 2 weeks.  It is important to know that upper respiratory infections do not cause serious illness or complications in most cases.    Upper respiratory infections can be transmitted from person to person, with the most common method of transmission being a person's hands.  The virus is able to live on the skin and can infect other persons for up to 2 hours after direct contact.  Also, these can be transmitted  when someone coughs or sneezes; thus, it is important to cover the mouth to reduce this risk.  To keep the spread of the illness at bay, good hand hygiene is very important.  This is an infection that is most likely caused by a virus. There are no specific treatments other than to help you with the symptoms until the infection runs its course.  We are sorry you are not feeling well.  Here is how we plan to help!   For nasal congestion, you may use an oral decongestants such as Mucinex D or if you have glaucoma or high blood pressure use plain Mucinex.  Saline nasal spray or nasal drops can help and can safely be used as often as needed for congestion.   If you do not have a history of heart disease, hypertension, diabetes or thyroid disease, prostate/bladder issues or glaucoma, you may also use Sudafed to treat nasal congestion.  It is highly recommended that you consult with a pharmacist or your primary care physician to ensure this medication is safe for you to take.     If you have a cough, you may use cough suppressants such as Delsym and Robitussin.  If you have glaucoma or high blood pressure, you can also use Coricidin HBP.    If you have a sore or scratchy throat, use a saltwater gargle-  to  teaspoon  of salt dissolved in a 4-ounce to 8-ounce glass of warm water.  Gargle the solution for approximately 15-30 seconds and then spit.  It is important not to swallow the solution.  You can also use throat lozenges/cough drops and Chloraseptic spray to help with throat pain or discomfort.  Warm or cold liquids can also be helpful in relieving throat pain.  For headache, pain or general discomfort, you can use Ibuprofen or Tylenol as directed.   Some authorities believe that zinc sprays or the use of Echinacea may shorten the course of your symptoms.   HOME CARE Only take medications as instructed by your medical team. Be sure to drink plenty of fluids. Water is fine as well as fruit juices,  sodas and electrolyte beverages. You may want to stay away from caffeine or alcohol. If you are nauseated, try taking small sips of liquids. How do you know if you are getting enough fluid? Your urine should be a pale yellow or almost colorless. Get rest. Taking a steamy shower or using a humidifier may help nasal congestion and ease sore throat pain. You can place a towel over your head and breathe in the steam from hot water coming from a faucet. Using a saline nasal spray works much the same way. Cough drops, hard candies and sore throat lozenges may ease your cough. Avoid close contacts especially the very young and the elderly Cover your mouth if you cough or sneeze Always remember to wash your hands.   GET HELP RIGHT AWAY IF: You develop worsening fever. If your symptoms do not improve within 10 days You develop yellow or green discharge from your nose over 3 days. You have coughing fits You develop a severe head ache or visual changes. You develop shortness of breath, difficulty breathing or start having chest pain Your symptoms persist after you have completed your treatment plan  MAKE SURE YOU  Understand these instructions. Will watch your condition. Will get help right away if you are not doing well or get worse.  Thank you for choosing an e-visit.  Your e-visit answers were reviewed by a board certified advanced clinical practitioner to complete your personal care plan. Depending upon the condition, your plan could have included both over the counter or prescription medications.  Please review your pharmacy choice. Make sure the pharmacy is open so you can pick up prescription now. If there is a problem, you may contact your provider through [employer] and have the prescription routed to another pharmacy.  Your safety is important to [place]. If you have drug allergies check your prescription carefully.   For the next 24 hours you can use MyChart to ask questions about  today's visit, request a non-urgent call back, or ask for a work or school excuse. You will get an email in the next two days asking about your experience. I hope that your e-visit has been valuable and will speed your recovery.  I spent approximately 5 minutes reviewing the patient's history, current symptoms and coordinating their care today.

## 2023-03-04 ENCOUNTER — Other Ambulatory Visit: Payer: Self-pay | Admitting: Nurse Practitioner

## 2023-03-07 ENCOUNTER — Other Ambulatory Visit: Payer: Self-pay | Admitting: Nurse Practitioner

## 2023-03-07 DIAGNOSIS — K120 Recurrent oral aphthae: Secondary | ICD-10-CM

## 2023-03-10 ENCOUNTER — Encounter: Payer: Self-pay | Admitting: Nurse Practitioner

## 2023-03-10 MED ORDER — DICLOFENAC EPOLAMINE 1.3 % EX PTCH: 1.0000 | MEDICATED_PATCH | Freq: Two times a day (BID) | CUTANEOUS | 0 refills | Status: DC

## 2023-03-12 ENCOUNTER — Encounter: Payer: Self-pay | Admitting: Nurse Practitioner

## 2023-03-15 ENCOUNTER — Ambulatory Visit: Payer: BC Managed Care – PPO | Admitting: Nurse Practitioner
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
## 2023-03-16 ENCOUNTER — Ambulatory Visit: Payer: BC Managed Care – PPO | Admitting: Nurse Practitioner
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
## 2023-03-25 ENCOUNTER — Encounter: Payer: Self-pay | Admitting: Nurse Practitioner

## 2023-03-25 ENCOUNTER — Other Ambulatory Visit: Payer: Self-pay | Admitting: Nurse Practitioner

## 2023-03-25 ENCOUNTER — Ambulatory Visit (INDEPENDENT_AMBULATORY_CARE_PROVIDER_SITE_OTHER): Payer: BC Managed Care – PPO | Admitting: Nurse Practitioner

## 2023-03-25 VITALS — BP 128/78 | HR 106 | Temp 97.9°F | Resp 17 | Ht 65.0 in | Wt 327.2 lb

## 2023-03-25 DIAGNOSIS — E782 Mixed hyperlipidemia: Secondary | ICD-10-CM

## 2023-03-25 DIAGNOSIS — R350 Frequency of micturition: Secondary | ICD-10-CM

## 2023-03-25 DIAGNOSIS — R102 Pelvic and perineal pain: Secondary | ICD-10-CM

## 2023-03-25 DIAGNOSIS — R52 Pain, unspecified: Secondary | ICD-10-CM

## 2023-03-25 DIAGNOSIS — Z79899 Other long term (current) drug therapy: Secondary | ICD-10-CM

## 2023-03-25 DIAGNOSIS — M25562 Pain in left knee: Secondary | ICD-10-CM

## 2023-03-25 DIAGNOSIS — M25561 Pain in right knee: Secondary | ICD-10-CM

## 2023-03-25 DIAGNOSIS — M25552 Pain in left hip: Secondary | ICD-10-CM

## 2023-03-25 DIAGNOSIS — R7309 Other abnormal glucose: Secondary | ICD-10-CM

## 2023-03-25 DIAGNOSIS — Z6841 Body Mass Index (BMI) 40.0 and over, adult: Secondary | ICD-10-CM

## 2023-03-25 DIAGNOSIS — G8929 Other chronic pain: Secondary | ICD-10-CM

## 2023-03-25 MED ORDER — TIRZEPATIDE-WEIGHT MANAGEMENT 2.5 MG/0.5ML ~~LOC~~ SOLN
2.5000 mg | SUBCUTANEOUS | 2 refills | Status: AC
Start: 2023-03-25 — End: ?
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
## 2023-03-25 NOTE — Patient Instructions (Signed)
Arthritis Arthritis means joint pain. It can also mean joint disease. A joint is a place where bones come together. There are more than 100 types of arthritis. What are the causes? Wear and tear of a joint. This is the most common cause. Too much of a chemical called uric acid in the blood, which leads to pain in the joint (gout). Pain and swelling (inflammation) in a joint. Infection of a joint. Injuries in the joint. A reaction to medicines (allergy). In some cases, the cause may not be known. What are the signs or symptoms? Pain in a joint when moving. Redness at a joint. Swelling at a joint. Stiffness at a joint. Warmth coming from the joint. A fever. A feeling of being sick. How is this treated? This condition may be treated with: Treating the cause, if it is known. Rest. Raising (elevating) the joint. Putting cold or hot packs on the joint. Medicines to treat symptoms and reduce pain and swelling. Shots (injections) of medicines, such as cortisone, into the joint. You may also be told to make changes in your life, such as doing exercises and losing weight. Follow these instructions at home: Medicines Take over-the-counter and prescription medicines only as told by your doctor. Do not take aspirin for pain if your doctor says that you may have gout. Activity Rest your joint if your doctor tells you to. Avoid activities that make the pain worse. Exercise your joint regularly as told by your doctor. Try doing exercises like: Swimming. Water aerobics. Biking. Walking. Managing pain, stiffness, and swelling     If told, put ice on the affected area. To do this: Put ice in a plastic bag. Place a towel between your skin and the bag. Leave the ice on for 20 minutes, 2-3 times a day. Take off the ice if your skin turns bright red. This is very important. If you cannot feel pain, heat, or cold, you have a greater risk of damage to the area. If your joint is swollen, raise  (elevate) it above the level of your heart if told by your doctor. If your joint feels stiff in the morning, try taking a warm shower. If told, put heat on the affected area. Do this as often as told by your doctor. Use the heat source that your doctor recommends, such as a moist heat pack or a heating pad. If you have diabetes, do not apply heat without asking your doctor. To apply heat: Place a towel between your skin and the heat source. Leave the heat on for 20-30 minutes. Take off the heat if your skin turns bright red. This is very important. If you cannot feel pain, heat, or cold, you have a greater risk of getting burned. General instructions Maintain a healthy weight. Follow instructions from your doctor for weight control. Do not smoke or use any products that contain nicotine or tobacco. If you need help quitting, ask your doctor. Keep all follow-up visits. Where to find more information [employer]: www.niams.http://www.myers.net/ Contact a doctor if: The pain gets worse. You have a fever. Get help right away if: You have very bad pain in your joint. You have swelling in your joint. Your joint is red. Many joints become painful and swollen. You have very bad back pain. Your leg is very weak. Summary Arthritis means joint pain. It can also mean joint disease. A joint is a place where bones come together. The most common cause of this condition is wear and  tear of a joint. Symptoms of this condition include redness, swelling, or stiffness of the joint. This condition is treated with rest, raising the joint, medicines, and putting cold or hot packs on the joint. Follow your doctor's instructions about medicines, activity, exercises, and other home care treatments. This information is not intended to replace advice given to you by your health care provider. Make sure you discuss any questions you have with your health care provider. Document Revised: 04/01/2021 Document  Reviewed: 04/01/2021 Elsevier Patient Education  2024 [employer].

## 2023-03-26 ENCOUNTER — Other Ambulatory Visit: Payer: Self-pay | Admitting: Nurse Practitioner

## 2023-03-26 ENCOUNTER — Encounter: Payer: Self-pay | Admitting: Nurse Practitioner

## 2023-03-26 MED ORDER — NITROFURANTOIN MONOHYD MACRO 100 MG PO CAPS: 100.0000 mg | ORAL_CAPSULE | Freq: Two times a day (BID) | ORAL | 0 refills | Status: AC

## 2023-03-27 LAB — URINALYSIS, ROUTINE W REFLEX MICROSCOPIC
Bilirubin Urine: NEGATIVE
Glucose, UA: NEGATIVE
Hgb urine dipstick: NEGATIVE
Hyaline Cast: NONE SEEN /LPF
Ketones, ur: NEGATIVE
Nitrite: POSITIVE — AB
RBC / HPF: NONE SEEN /HPF (ref 0–2)
Specific Gravity, Urine: 1.022 (ref 1.001–1.035)
pH: 8 (ref 5.0–8.0)

## 2023-03-27 LAB — MICROSCOPIC MESSAGE

## 2023-03-27 LAB — COMPLETE METABOLIC PANEL WITH GFR
AG Ratio: 1.5 (calc) (ref 1.0–2.5)
ALT: 13 U/L (ref 6–29)
AST: 20 U/L (ref 10–35)
Albumin: 4.3 g/dL (ref 3.6–5.1)
Alkaline phosphatase (APISO): 62 U/L (ref 37–153)
BUN: 12 mg/dL (ref 7–25)
CO2: 30 mmol/L (ref 20–32)
Calcium: 9.4 mg/dL (ref 8.6–10.4)
Chloride: 102 mmol/L (ref 98–110)
Creat: 0.82 mg/dL (ref 0.50–1.03)
Globulin: 2.8 g/dL (calc) (ref 1.9–3.7)
Glucose, Bld: 91 mg/dL (ref 65–99)
Potassium: 4.3 mmol/L (ref 3.5–5.3)
Sodium: 140 mmol/L (ref 135–146)
Total Bilirubin: 0.6 mg/dL (ref 0.2–1.2)
Total Protein: 7.1 g/dL (ref 6.1–8.1)
eGFR: 85 mL/min/{1.73_m2} (ref >=60)

## 2023-03-27 LAB — URINE CULTURE
MICRO NUMBER:: 15490105
SPECIMEN QUALITY:: ADEQUATE

## 2023-03-27 LAB — CBC WITH DIFFERENTIAL/PLATELET
Absolute Monocytes: 489 cells/uL (ref 200–950)
Basophils Absolute: 21 cells/uL (ref 0–200)
Basophils Relative: 0.4 %
Eosinophils Absolute: 52 cells/uL (ref 15–500)
Eosinophils Relative: 1 %
HCT: 40.1 % (ref 35.0–45.0)
Hemoglobin: 13.1 g/dL (ref 11.7–15.5)
Lymphs Abs: 2366 cells/uL (ref 850–3900)
MCH: 31.2 pg (ref 27.0–33.0)
MCHC: 32.7 g/dL (ref 32.0–36.0)
MCV: 95.5 fL (ref 80.0–100.0)
MPV: 10.3 fL (ref 7.5–12.5)
Monocytes Relative: 9.4 %
Neutro Abs: 2272 cells/uL (ref 1500–7800)
Neutrophils Relative %: 43.7 %
Platelets: 283 10*3/uL (ref 140–400)
RBC: 4.2 10*6/uL (ref 3.80–5.10)
RDW: 13.3 % (ref 11.0–15.0)
Total Lymphocyte: 45.5 %
WBC: 5.2 10*3/uL (ref 3.8–10.8)

## 2023-03-27 LAB — INSULIN, RANDOM: Insulin: 20.5 u[IU]/mL — ABNORMAL HIGH

## 2023-03-27 LAB — LIPID PANEL
Cholesterol: 199 mg/dL (ref <200)
HDL: 53 mg/dL (ref >=50)
LDL Cholesterol (Calc): 123 mg/dL (calc) — ABNORMAL HIGH
Non-HDL Cholesterol (Calc): 146 mg/dL (calc) — ABNORMAL HIGH (ref <130)
Total CHOL/HDL Ratio: 3.8 (calc) (ref <5.0)
Triglycerides: 122 mg/dL (ref <150)

## 2023-03-27 LAB — HEMOGLOBIN A1C
Hgb A1c MFr Bld: 5.9 % of total Hgb — ABNORMAL HIGH (ref <5.7)
Mean Plasma Glucose: 123 mg/dL
eAG (mmol/L): 6.8 mmol/L

## 2023-03-28 MED ORDER — FLUCONAZOLE 150 MG PO TABS
ORAL_TABLET | ORAL | 0 refills | Status: DC
Start: 2023-03-28 — End: 2023-04-14
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
## 2023-04-01 ENCOUNTER — Other Ambulatory Visit: Payer: Self-pay | Admitting: Nurse Practitioner

## 2023-04-01 ENCOUNTER — Other Ambulatory Visit: Payer: Self-pay

## 2023-04-01 DIAGNOSIS — E782 Mixed hyperlipidemia: Secondary | ICD-10-CM

## 2023-04-01 DIAGNOSIS — R7309 Other abnormal glucose: Secondary | ICD-10-CM

## 2023-04-01 MED ORDER — TIRZEPATIDE-WEIGHT MANAGEMENT 2.5 MG/0.5ML ~~LOC~~ SOAJ: 2.5000 mg | SUBCUTANEOUS | 0 refills | Status: DC

## 2023-04-07 ENCOUNTER — Other Ambulatory Visit: Payer: Self-pay | Admitting: Nurse Practitioner

## 2023-04-07 DIAGNOSIS — R7309 Other abnormal glucose: Secondary | ICD-10-CM

## 2023-04-07 DIAGNOSIS — E782 Mixed hyperlipidemia: Secondary | ICD-10-CM

## 2023-04-07 MED ORDER — TIRZEPATIDE-WEIGHT MANAGEMENT 2.5 MG/0.5ML ~~LOC~~ SOLN: 2.5000 mg | SUBCUTANEOUS | 2 refills | Status: DC

## 2023-04-14 ENCOUNTER — Encounter: Payer: Self-pay | Admitting: Obstetrics and Gynecology

## 2023-04-14 ENCOUNTER — Ambulatory Visit: Payer: BC Managed Care – PPO | Admitting: Obstetrics and Gynecology

## 2023-04-14 VITALS — BP 128/76 | HR 101 | Ht 65.25 in | Wt 320.0 lb

## 2023-04-14 DIAGNOSIS — N951 Menopausal and female climacteric states: Secondary | ICD-10-CM

## 2023-04-16 ENCOUNTER — Other Ambulatory Visit: Payer: Self-pay | Admitting: Nurse Practitioner

## 2023-04-17 ENCOUNTER — Other Ambulatory Visit: Payer: Self-pay | Admitting: Nurse Practitioner

## 2023-04-20 MED ORDER — DICLOFENAC SODIUM 50 MG PO TBEC: 50.0000 mg | DELAYED_RELEASE_TABLET | Freq: Two times a day (BID) | ORAL | 1 refills | Status: DC | PRN

## 2023-04-22 ENCOUNTER — Telehealth: Payer: Self-pay | Admitting: Nurse Practitioner

## 2023-04-22 NOTE — Telephone Encounter (Signed)
Called with information on Zepbound. Send to [employer] (one word) "cash pay for zeppund vial" Address- [phone], [place], [place] [address] Phone number- [phone]

## 2023-05-07 ENCOUNTER — Encounter: Payer: BC Managed Care – PPO | Admitting: Nurse Practitioner

## 2023-05-17 ENCOUNTER — Ambulatory Visit (INDEPENDENT_AMBULATORY_CARE_PROVIDER_SITE_OTHER): Payer: BC Managed Care – PPO | Admitting: Nurse Practitioner

## 2023-05-17 ENCOUNTER — Other Ambulatory Visit: Payer: Self-pay

## 2023-05-17 ENCOUNTER — Encounter: Payer: Self-pay | Admitting: Nurse Practitioner

## 2023-05-17 VITALS — BP 110/70 | HR 86 | Temp 98.0°F | Resp 16 | Ht 65.25 in | Wt 316.6 lb

## 2023-05-17 DIAGNOSIS — E559 Vitamin D deficiency, unspecified: Secondary | ICD-10-CM

## 2023-05-17 DIAGNOSIS — I10 Essential (primary) hypertension: Secondary | ICD-10-CM | POA: Diagnosis not present

## 2023-05-17 DIAGNOSIS — Z1389 Encounter for screening for other disorder: Secondary | ICD-10-CM | POA: Diagnosis not present

## 2023-05-17 DIAGNOSIS — Z0000 Encounter for general adult medical examination without abnormal findings: Secondary | ICD-10-CM | POA: Diagnosis not present

## 2023-05-17 DIAGNOSIS — E782 Mixed hyperlipidemia: Secondary | ICD-10-CM

## 2023-05-17 DIAGNOSIS — Z13220 Encounter for screening for lipoid disorders: Secondary | ICD-10-CM | POA: Diagnosis not present

## 2023-05-17 DIAGNOSIS — Z131 Encounter for screening for diabetes mellitus: Secondary | ICD-10-CM | POA: Diagnosis not present

## 2023-05-17 DIAGNOSIS — R7309 Other abnormal glucose: Secondary | ICD-10-CM

## 2023-05-17 DIAGNOSIS — Z1329 Encounter for screening for other suspected endocrine disorder: Secondary | ICD-10-CM

## 2023-05-17 DIAGNOSIS — Z79899 Other long term (current) drug therapy: Secondary | ICD-10-CM

## 2023-05-17 DIAGNOSIS — Z136 Encounter for screening for cardiovascular disorders: Secondary | ICD-10-CM | POA: Diagnosis not present

## 2023-05-17 DIAGNOSIS — Z0001 Encounter for general adult medical examination with abnormal findings: Secondary | ICD-10-CM

## 2023-05-17 DIAGNOSIS — Z90710 Acquired absence of both cervix and uterus: Secondary | ICD-10-CM

## 2023-05-17 DIAGNOSIS — F419 Anxiety disorder, unspecified: Secondary | ICD-10-CM

## 2023-05-17 MED ORDER — TIRZEPATIDE-WEIGHT MANAGEMENT 2.5 MG/0.5ML ~~LOC~~ SOLN: 2.5000 mg | SUBCUTANEOUS | 2 refills | Status: DC

## 2023-05-17 NOTE — Progress Notes (Signed)
Complete Physical  Assessment and Plan:  Mary Banks was seen today for annual exam.  Diagnoses and all orders for this visit:  Encounter for Annual Physical Exam with abnormal findings Due annually  Health Maintenance reviewed  Hypertension, unspecified type Discussed DASH (Dietary Approaches to Stop Hypertension) DASH diet is lower in sodium than a typical American diet. Cut back on foods that are high in saturated fat, cholesterol, and trans fats. Eat more whole-grain foods, fish, poultry, and nuts Remain active and exercise as tolerated daily.  Monitor BP at home-Call if greater than 130/80.  Check CMP/CBC  Moderate mixed hyperlipidemia not requiring statin therapy Discussed lifestyle modifications. Recommended diet heavy in fruits and veggies, omega 3's. Decrease consumption of animal meats, cheeses, and dairy products. Remain active and exercise as tolerated. Continue to monitor. Check lipids/TSH  Other abnormal glucose (hx of prediabetes) Education: Reviewed 'ABCs' of diabetes management  Discussed goals to be met and/or maintained include A1C (<7) Blood pressure (<130/80) Cholesterol (LDL <70) Continue Eye Exam yearly  Continue Dental Exam Q6 mo Discussed dietary recommendations Discussed Physical Activity recommendations Check A1C  Morbid obesity with BMI of 50.0-59.9, adult [facility]) Start [facility] as directed. Discussed appropriate BMI Diet modification. Physical activity. Encouraged/praised to build confidence.  Vitamin D deficiency Continue supplement Continue to monitor  Anxiety Reviewed relaxation techniques.  Sleep hygiene. Recommended Cognitive Behavioral Therapy (CBT). Recommended mindfulness meditation and exercise.   Insight-oriented psychotherapy provided. Psychoeducation:  encouraged personality growth wand development through coping techniques and problem-solving skills. Limit/Decrease/Monitor drug/alcohol intake.    S/p lab hysterectomy For  uterine leioma; 1 ovary spared; GYN following   Medication management All medications discussed and reviewed in full. All questions and concerns regarding medications addressed.    Screening for cardiovascular condiiotn Monitor EKG  Screening for hematuria or proteinturia Monitor UA   Screening for thyroid disorder Check and montior TSH levels  Screening for Diabetes Mellitus Check and monitor A1c and insulin  Orders Placed This Encounter  Procedures   CBC with Differential/Platelet   COMPLETE METABOLIC PANEL WITH GFR   Magnesium   Lipid panel   TSH   Hemoglobin A1c   Insulin, random   VITAMIN D 25 Hydroxy (Vit-D Deficiency, Fractures)   Urinalysis, Routine w reflex microscopic   Microalbumin / creatinine urine ratio   EKG 12-Lead    Notify office for further evaluation and treatment, questions or concerns if any reported s/s fail to improve.   The patient was advised to call back or seek an in-person evaluation if any symptoms worsen or if the condition fails to improve as anticipated.   Further disposition pending results of labs. Discussed med's effects and SE's.    I discussed the assessment and treatment plan with the patient. The patient was provided an opportunity to ask questions and all were answered. The patient agreed with the plan and demonstrated an understanding of the instructions.  Discussed med's effects and SE's. Screening labs and tests as requested with regular follow-up as recommended.  I provided 35 minutes of face-to-face time during this encounter including counseling, chart review, and critical decision making was preformed.  Today's Plan of Care is based on a patient-centered health care approach known as shared decision making - the decisions, tests and treatments allow for patient preferences and values to be balanced with clinical evidence.     Future Appointments  Date Time Provider Department Center  11/16/2023 11:00 AM [name],  NP [facility] None  05/16/2024 10:00 AM [name], NP [facility]  None    HPI  52 y.o. female  presents for a complete physical and follow up for has Hypertension; Morbid obesity with BMI of 50.0-59.9, adult (HCC); Vitamin D deficiency; Anxiety; Hyperlipemia; Other abnormal glucose (prediabetes); B12 deficiency; Status post laparoscopic hysterectomy; and History of rabies vaccination on their problem list..  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 Mary Banks is single but has recently started dating someone  new.  Mary Banks is enjoying her new relationship.  Defers STD testing today.  Overall Mary Banks reports feeling well.    Brother passed last year around this time, from AL Amyloidosis - Blood protein disorder r/t myeloma.  He did not have the herideritary type.  He had the heart test and was diagnosed.  Mary Banks is interested in genetic testing, but not today.  Tearful in clinic and has increase in anxiety.  Has taken Buspar in the past.  Mary Banks does have a long hx of anxiety, was recently improved until stress with closing home has xanax PRN, typically takes very rarely, 1-2 per moth, has taken 2-3 tabs in recent week d/t coping with brother's death.   Hx of uterine leiomyoma recently s/p lab hysterectomy in April 2022 with L ovary spared; follows with GYN at [facility].   BMI is Body mass index is 52.28 kg/m., Mary Banks is working on diet and exercise. Mary Banks is prescribed phentermine for weight loss with mild benefit (uses PRN only); was recently switched from ozempic to [facility] with improved results but is unable to sustain Rx via insurance.    Denies palpitations, anxiety, trouble sleeping, elevated BP.  Mary Banks has reduced alcohol, 0-1/week Working on consistent water intake,  Sleep - improving, working up to 8 hours consistently  Next goal <300 lb Wt Readings from Last 3 Encounters:  05/17/23 (!) 316 lb 9.6 oz (143.6 kg)  04/14/23 (!) 320 lb (145.2 kg)  03/25/23 (!) 327 lb 3.2  oz (148.4 kg)   Her blood pressure has been controlled at home (110-120s/70-80s), today their BP is BP: 110/70 Mary Banks does workout. Mary Banks denies chest pain, shortness of breath, dizziness.   Mary Banks is not on cholesterol medication and denies myalgias. Her cholesterol is not at goal. 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 The cholesterol last visit was:   Lab Results  Component Value Date   CHOL 199 03/25/2023   HDL 53 03/25/2023   LDLCALC 123 (H) 03/25/2023   TRIG 122 03/25/2023   CHOLHDL 3.8 03/25/2023   Mary Banks has been working on diet and exercise for hx of prediabetes, has been on ozempic/now mounjaro for weight loss benefits and controlled in normal range. Last A1C in the office was:  Lab Results  Component Value Date   HGBA1C 5.9 (H) 03/25/2023   Last GFR: Lab Results  Component Value Date   GFRAA 97 08/19/2020   Patient is on Vitamin D supplement, hx of fatigue improved since.  Lab Results  Component Value Date   VD25OH 61 05/01/2022     Mary Banks is on a B12 supplement with last check above goal, has been off of supplement.  Lab Results  Component Value Date   VITAMINB12 687 05/01/2021     Current Medications:  Current Outpatient Medications on File Prior to Visit  Medication Sig Dispense Refill   acyclovir (ZOVIRAX) 800 MG tablet TAKE 1 TABLET BY MOUTH TWO TIMES A DAY FOR 5 DAYS AS NEEDED FOR HERPES FLARE & REPEAT 180 tablet 1   ALPRAZolam (  XANAX) 0.5 MG tablet Take 1 tablet (0.5 mg total) by mouth 2 (two) times daily as needed for anxiety. 30 tablet 0   cetirizine (ZYRTEC) 10 MG tablet Take 10 mg by mouth in the morning.     Cholecalciferol (D3-50) 1.25 MG (50000 UT) capsule TAKE 1 CAPSULE ONCE A WEEK FOR VITAMIN D DEFICIENCY 12 capsule 1   diclofenac (FLECTOR) 1.3 % PTCH Place 1 patch onto the skin 2 (two) times daily. 30 patch 0   Digestive Enzymes (DIGESTIVE ENZYME PO) Take 1 capsule by mouth in the morning.     Estradiol (YUVAFEM) 10 MCG TABS vaginal tablet  Place 1 tablet (10 mcg total) vaginally 2 (two) times a week. 24 tablet 4   losartan-hydrochlorothiazide (HYZAAR) 100-25 MG tablet TAKE 1 TABLET BY MOUTH EVERY DAY 90 tablet 0   Multiple Vitamin (MULTIVITAMIN WITH MINERALS) TABS tablet Take 1 tablet by mouth in the morning.     phentermine (ADIPEX-P) 37.5 MG tablet TAKE 1/2 TO 1 TABLET EVERY MORNING FOR DIETING & WEIGHT LOSS 30 tablet 0   diclofenac (VOLTAREN) 50 MG EC tablet Take 1 tablet (50 mg total) by mouth 2 (two) times daily as needed. (Patient not taking: Reported on 05/17/2023) 60 tablet 1   tirzepatide (ZEPBOUND) 2.5 MG/0.5ML injection vial Inject 2.5 mg into the skin once a week. Patient Address: [address] [place] [address]; [phone] (M); [email or web address]. (Patient not taking: Reported on 04/14/2023) 2 mL 2   No current facility-administered medications on file prior to visit.   Allergies:  Allergies  Allergen Reactions   Morphine Other (See Comments)    Other Reaction: hallucinations Hallucinations    Echinacea-Golden Seal [Nutritional Supplements]     Specifically tea   Medical History:  Mary Banks has Hypertension; Morbid obesity with BMI of 50.0-59.9, adult (HCC); Vitamin D deficiency; Anxiety; Hyperlipemia; Other abnormal glucose (prediabetes); B12 deficiency; Status post laparoscopic hysterectomy; and History of rabies vaccination on their problem list.  Health Maintenance:   Immunization History  Administered Date(s) Administered   Hepatitis A, Adult 07/06/2004   Hepatitis B, ADULT 07/06/2004   Influenza Inj Mdck Quad With Preservative 04/10/2019, 05/16/2020   Influenza,inj,Quad PF,6+ Mos 07/06/2008, 05/07/2011, 08/02/2013, 04/01/2016, 05/24/2017, 04/11/2018, 05/01/2022   Influenza,trivalent, recombinat, inj, PF 05/10/2023   Influenza-Unspecified 03/22/2021   PFIZER(Purple Top)SARS-COV-2 Vaccination 09/10/2019, 10/11/2019, 04/25/2020   Pfizer Covid-19 Vaccine Bivalent Booster 94yrs & up 03/22/2021    Pfizer(Comirnaty)Fall Seasonal Vaccine 12 years and older 05/10/2023   Tdap 03/21/2018   Tetanus 07/06/2004   Tetanus: 2019 Pneumonia: get age 69 Flu vaccine: 05/2023 Shingrix: check with insurance, postpone for now  Covid 19: 2/2, 2021, pfizer, boosted 05/2023  LMP: Patient's last menstrual period was 07/18/2020 (approximate). Pap: now s/p partial hysterectomy 2022 goes to GYN annually, last PAP 2020 due to hx of abnormal in 2019 - re-check for HPV only 2026 MGM: 09/2021 - has upcoming for 06/2023 DEXA: defer to GYN or get at age 58  Colonoscopy: 08/2018, Dr. Marina Goodell, normal, 10 year  EGD: n/a  Last Dental Exam: Dr. Rubye Oaks, last 2024, goes q64m, will have new dental - [place] Last Eye Exam: [facility], last 01/2022, glasses plans to scheudle   Patient Care Team: Lucky Cowboy, MD as PCP - General (Internal Medicine)  Surgical History:  Mary Banks has a past surgical history that includes Burn debridement surgery (2000); Colonoscopy with propofol (N/A, 08/15/2018); Reduction mammaplasty; Total laparoscopic hysterectomy with salpingectomy (N/A, 10/21/2020); and Cystoscopy (N/A, 10/21/2020). Family History:  Herfamily history includes  Bone cancer in her brother; Diabetes in her mother; Diabetes type II in her maternal grandfather; Hypertension in her father and mother; Ovarian cancer in her maternal grandmother; Thyroid disease in her mother. Social History:  Mary Banks reports that Mary Banks quit smoking about 30 years ago. Her smoking use included cigarettes. Mary Banks started smoking about 33 years ago. Mary Banks has a 1.5 pack-year smoking history. Mary Banks has never used smokeless tobacco. Mary Banks reports current alcohol use of about 1.0 standard drink of alcohol per week. Mary Banks reports that Mary Banks does not currently use drugs.  Review of Systems: Review of Systems  Constitutional:  Negative for malaise/fatigue and weight loss.  HENT:  Negative for hearing loss and tinnitus.   Eyes:  Negative for blurred vision and  double vision.  Respiratory:  Negative for cough, shortness of breath and wheezing.   Cardiovascular:  Negative for chest pain, palpitations, orthopnea, claudication and leg swelling.  Gastrointestinal:  Negative for abdominal pain, blood in stool, constipation, diarrhea, heartburn, melena, nausea and vomiting.  Genitourinary: Negative.   Musculoskeletal:  Negative for joint pain and myalgias.  Skin:  Negative for rash.  Neurological:  Negative for dizziness, tingling, sensory change, weakness and headaches.  Endo/Heme/Allergies:  Negative for polydipsia.  Psychiatric/Behavioral: Negative.    All other systems reviewed and are negative.   Physical Exam: Estimated body mass index is 52.28 kg/m as calculated from the following:   Height as of this encounter: 5' 5.25" (1.657 m).   Weight as of this encounter: 316 lb 9.6 oz (143.6 kg). BP 110/70   Pulse 86   Temp 98 F (36.7 C)   Resp 16   Ht 5' 5.25" (1.657 m)   Wt (!) 316 lb 9.6 oz (143.6 kg)   LMP 07/18/2020 (Approximate)   SpO2 99%   BMI 52.28 kg/m  General Appearance: Well nourished, in no apparent distress.  Eyes: PERRLA, EOMs, conjunctiva no swelling or erythema. Sinuses: No Frontal/maxillary tenderness  ENT/Mouth: Ext aud canals clear, normal light reflex with TMs without erythema, bulging. Good dentition. No erythema, swelling, or exudate on post pharynx. Tonsils not swollen or erythematous. Hearing normal.  Neck: Supple, thyroid normal. No bruits  Respiratory: Respiratory effort normal, BS equal bilaterally without rales, rhonchi, wheezing or stridor.  Cardio: RRR without murmurs, rubs or gallops. Brisk peripheral pulses without edema.  Chest: symmetric, with normal excursions and percussion.  Breasts: Defer to GYN Abdomen: Soft, obese abdomen, nontender, no guarding, rebound, hernias, masses, or organomegaly.  Lymphatics: Non tender without lymphadenopathy.  Genitourinary: defer to GYN Musculoskeletal: Full ROM all  peripheral extremities,5/5 strength, and normal gait.  Skin: Warm, dry without rashes, lesions, ecchymosis. Neuro: Cranial nerves intact, reflexes equal bilaterally. Normal muscle tone, no cerebellar symptoms. Sensation intact.  Psych: Awake and oriented X 3, normal affect, Insight and Judgment appropriate.   EKG: NSR, WNL  Shanea Karney 2:51 PM  Adult [facility] Adolescent Internal Medicine

## 2023-05-17 NOTE — Patient Instructions (Signed)

## 2023-05-18 ENCOUNTER — Other Ambulatory Visit: Payer: Self-pay

## 2023-05-18 DIAGNOSIS — E782 Mixed hyperlipidemia: Secondary | ICD-10-CM

## 2023-05-18 DIAGNOSIS — R7309 Other abnormal glucose: Secondary | ICD-10-CM

## 2023-05-18 LAB — CBC WITH DIFFERENTIAL/PLATELET
Absolute Lymphocytes: 2564 {cells}/uL (ref 850–3900)
Absolute Monocytes: 369 {cells}/uL (ref 200–950)
Basophils Absolute: 31 {cells}/uL (ref 0–200)
Basophils Relative: 0.6 %
Eosinophils Absolute: 62 {cells}/uL (ref 15–500)
Eosinophils Relative: 1.2 %
HCT: 41.4 % (ref 35.0–45.0)
Hemoglobin: 13.6 g/dL (ref 11.7–15.5)
MCH: 30.6 pg (ref 27.0–33.0)
MCHC: 32.9 g/dL (ref 32.0–36.0)
MCV: 93.2 fL (ref 80.0–100.0)
MPV: 10.2 fL (ref 7.5–12.5)
Monocytes Relative: 7.1 %
Neutro Abs: 2174 {cells}/uL (ref 1500–7800)
Neutrophils Relative %: 41.8 %
Platelets: 307 10*3/uL (ref 140–400)
RBC: 4.44 10*6/uL (ref 3.80–5.10)
RDW: 12.5 % (ref 11.0–15.0)
Total Lymphocyte: 49.3 %
WBC: 5.2 10*3/uL (ref 3.8–10.8)

## 2023-05-18 LAB — MICROALBUMIN / CREATININE URINE RATIO
Creatinine, Urine: 59 mg/dL (ref 20–275)
Microalb Creat Ratio: 5 mg/g{creat} (ref <30)
Microalb, Ur: 0.3 mg/dL

## 2023-05-18 LAB — LIPID PANEL
Cholesterol: 214 mg/dL — ABNORMAL HIGH (ref <200)
HDL: 56 mg/dL (ref >=50)
LDL Cholesterol (Calc): 133 mg/dL — ABNORMAL HIGH
Non-HDL Cholesterol (Calc): 158 mg/dL — ABNORMAL HIGH (ref <130)
Total CHOL/HDL Ratio: 3.8 (calc) (ref <5.0)
Triglycerides: 133 mg/dL (ref <150)

## 2023-05-18 LAB — COMPLETE METABOLIC PANEL WITH GFR
AG Ratio: 1.5 (calc) (ref 1.0–2.5)
ALT: 15 U/L (ref 6–29)
AST: 19 U/L (ref 10–35)
Albumin: 4.4 g/dL (ref 3.6–5.1)
Alkaline phosphatase (APISO): 65 U/L (ref 37–153)
BUN: 9 mg/dL (ref 7–25)
CO2: 31 mmol/L (ref 20–32)
Calcium: 9.8 mg/dL (ref 8.6–10.4)
Chloride: 100 mmol/L (ref 98–110)
Creat: 0.91 mg/dL (ref 0.50–1.03)
Globulin: 2.9 g/dL (ref 1.9–3.7)
Glucose, Bld: 96 mg/dL (ref 65–99)
Potassium: 4.5 mmol/L (ref 3.5–5.3)
Sodium: 138 mmol/L (ref 135–146)
Total Bilirubin: 0.6 mg/dL (ref 0.2–1.2)
Total Protein: 7.3 g/dL (ref 6.1–8.1)
eGFR: 75 mL/min/{1.73_m2} (ref >=60)

## 2023-05-18 LAB — URINALYSIS, ROUTINE W REFLEX MICROSCOPIC
Bilirubin Urine: NEGATIVE
Glucose, UA: NEGATIVE
Hgb urine dipstick: NEGATIVE
Ketones, ur: NEGATIVE
Leukocytes,Ua: NEGATIVE
Nitrite: NEGATIVE
Protein, ur: NEGATIVE
Specific Gravity, Urine: 1.009 (ref 1.001–1.035)
pH: 8 (ref 5.0–8.0)

## 2023-05-18 LAB — INSULIN, RANDOM: Insulin: 37.9 u[IU]/mL — ABNORMAL HIGH

## 2023-05-18 LAB — HEMOGLOBIN A1C
Hgb A1c MFr Bld: 6.1 %{Hb} — ABNORMAL HIGH (ref <5.7)
Mean Plasma Glucose: 128 mg/dL
eAG (mmol/L): 7.1 mmol/L

## 2023-05-18 LAB — VITAMIN D 25 HYDROXY (VIT D DEFICIENCY, FRACTURES): Vit D, 25-Hydroxy: 80 ng/mL (ref 30–100)

## 2023-05-18 LAB — MAGNESIUM: Magnesium: 2 mg/dL (ref 1.5–2.5)

## 2023-05-18 LAB — TSH: TSH: 1.87 m[IU]/L

## 2023-05-18 MED ORDER — TIRZEPATIDE-WEIGHT MANAGEMENT 2.5 MG/0.5ML ~~LOC~~ SOLN: 2.5000 mg | SUBCUTANEOUS | 2 refills | Status: DC

## 2023-06-07 ENCOUNTER — Other Ambulatory Visit: Payer: Self-pay | Admitting: Nurse Practitioner

## 2023-06-07 DIAGNOSIS — Z6841 Body Mass Index (BMI) 40.0 and over, adult: Secondary | ICD-10-CM

## 2023-06-09 ENCOUNTER — Other Ambulatory Visit: Payer: Self-pay | Admitting: Nurse Practitioner

## 2023-07-18 ENCOUNTER — Other Ambulatory Visit: Payer: Self-pay | Admitting: Nurse Practitioner

## 2023-07-25 ENCOUNTER — Other Ambulatory Visit: Payer: Self-pay | Admitting: Nurse Practitioner

## 2023-08-03 ENCOUNTER — Other Ambulatory Visit: Payer: Self-pay | Admitting: Nurse Practitioner

## 2023-08-03 DIAGNOSIS — R7309 Other abnormal glucose: Secondary | ICD-10-CM

## 2023-08-03 DIAGNOSIS — E782 Mixed hyperlipidemia: Secondary | ICD-10-CM

## 2023-08-10 ENCOUNTER — Telehealth: Payer: 59 | Admitting: Physician Assistant

## 2023-08-10 DIAGNOSIS — B9689 Other specified bacterial agents as the cause of diseases classified elsewhere: Secondary | ICD-10-CM | POA: Diagnosis not present

## 2023-08-10 DIAGNOSIS — J069 Acute upper respiratory infection, unspecified: Secondary | ICD-10-CM

## 2023-08-10 MED ORDER — AZITHROMYCIN 250 MG PO TABS: ORAL_TABLET | ORAL | 0 refills | Status: AC

## 2023-08-10 MED ORDER — BENZONATATE 100 MG PO CAPS: 100.0000 mg | ORAL_CAPSULE | Freq: Three times a day (TID) | ORAL | 0 refills | Status: DC | PRN

## 2023-08-10 NOTE — Progress Notes (Signed)
E-Visit for Cough  We are sorry that you are not feeling well.  Here is how we plan to help!  Based on your presentation I believe you most likely have A cough due to bacteria.  When patients have a productive cough with a change in color or increased sputum production, we are concerned about bacterial bronchitis.  If left untreated it can progress to pneumonia.  If your symptoms do not improve with your treatment plan it is important that you contact your provider.   I have prescribed Azithromyin 250 mg: two tablets now and then one tablet daily for 4 additonal days    In addition you may use A prescription cough medication called Tessalon Perles 100mg. You may take 1-2 capsules every 8 hours as needed for your cough.  From your responses in the eVisit questionnaire you describe inflammation in the upper respiratory tract which is causing a significant cough.  This is commonly called Bronchitis and has four common causes:   Allergies Viral Infections Acid Reflux Bacterial Infection Allergies, viruses and acid reflux are treated by controlling symptoms or eliminating the cause. An example might be a cough caused by taking certain blood pressure medications. You stop the cough by changing the medication. Another example might be a cough caused by acid reflux. Controlling the reflux helps control the cough.  USE OF BRONCHODILATOR ("RESCUE") INHALERS: There is a risk from using your bronchodilator too frequently.  The risk is that over-reliance on a medication which only relaxes the muscles surrounding the breathing tubes can reduce the effectiveness of medications prescribed to reduce swelling and congestion of the tubes themselves.  Although you feel brief relief from the bronchodilator inhaler, your asthma may actually be worsening with the tubes becoming more swollen and filled with mucus.  This can delay other crucial treatments, such as oral steroid medications. If you need to use a bronchodilator  inhaler daily, several times per day, you should discuss this with your provider.  There are probably better treatments that could be used to keep your asthma under control.     HOME CARE Only take medications as instructed by your medical team. Complete the entire course of an antibiotic. Drink plenty of fluids and get plenty of rest. Avoid close contacts especially the very young and the elderly Cover your mouth if you cough or cough into your sleeve. Always remember to wash your hands A steam or ultrasonic humidifier can help congestion.   GET HELP RIGHT AWAY IF: You develop worsening fever. You become short of breath You cough up blood. Your symptoms persist after you have completed your treatment plan MAKE SURE YOU  Understand these instructions. Will watch your condition. Will get help right away if you are not doing well or get worse.    Thank you for choosing an e-visit.  Your e-visit answers were reviewed by a board certified advanced clinical practitioner to complete your personal care plan. Depending upon the condition, your plan could have included both over the counter or prescription medications.  Please review your pharmacy choice. Make sure the pharmacy is open so you can pick up prescription now. If there is a problem, you may contact your provider through MyChart messaging and have the prescription routed to another pharmacy.  Your safety is important to us. If you have drug allergies check your prescription carefully.   For the next 24 hours you can use MyChart to ask questions about today's visit, request a non-urgent call back, or ask   for a work or school excuse. You will get an email in the next two days asking about your experience. I hope that your e-visit has been valuable and will speed your recovery.  

## 2023-08-10 NOTE — Progress Notes (Signed)
 Message sent to patient requesting further input regarding current symptoms. Awaiting patient response.

## 2023-08-10 NOTE — Progress Notes (Signed)
 I have spent 5 minutes in review of e-visit questionnaire, review and updating patient chart, medical decision making and response to patient.   Piedad Climes, PA-C

## 2023-09-08 ENCOUNTER — Other Ambulatory Visit: Payer: Self-pay

## 2023-09-08 DIAGNOSIS — E559 Vitamin D deficiency, unspecified: Secondary | ICD-10-CM

## 2023-09-08 MED ORDER — PHENTERMINE HCL 37.5 MG PO TABS: ORAL_TABLET | ORAL | 0 refills | Status: DC

## 2023-09-08 MED ORDER — D3-50 1.25 MG (50000 UT) PO CAPS: ORAL_CAPSULE | ORAL | 1 refills | Status: DC

## 2023-09-14 ENCOUNTER — Ambulatory Visit: Admitting: Urgent Care

## 2023-09-20 ENCOUNTER — Ambulatory Visit: Admitting: Urgent Care

## 2023-09-21 ENCOUNTER — Telehealth

## 2023-09-21 DIAGNOSIS — R3989 Other symptoms and signs involving the genitourinary system: Secondary | ICD-10-CM | POA: Diagnosis not present

## 2023-09-22 MED ORDER — CEPHALEXIN 500 MG PO CAPS: 500.0000 mg | ORAL_CAPSULE | Freq: Two times a day (BID) | ORAL | 0 refills | Status: AC

## 2023-09-22 NOTE — Progress Notes (Signed)

## 2023-09-22 NOTE — Progress Notes (Signed)
 I have spent 5 minutes in review of e-visit questionnaire, review and updating patient chart, medical decision making and response to patient.   Piedad Climes, PA-C

## 2023-09-27 ENCOUNTER — Ambulatory Visit: Admitting: Urgent Care

## 2023-09-27 VITALS — BP 136/86 | HR 79 | Ht 65.0 in | Wt 313.4 lb

## 2023-09-27 DIAGNOSIS — M25562 Pain in left knee: Secondary | ICD-10-CM

## 2023-09-27 DIAGNOSIS — E538 Deficiency of other specified B group vitamins: Secondary | ICD-10-CM | POA: Diagnosis not present

## 2023-09-27 DIAGNOSIS — F419 Anxiety disorder, unspecified: Secondary | ICD-10-CM

## 2023-09-27 DIAGNOSIS — Z9229 Personal history of other drug therapy: Secondary | ICD-10-CM

## 2023-09-27 DIAGNOSIS — R7303 Prediabetes: Secondary | ICD-10-CM | POA: Diagnosis not present

## 2023-09-27 DIAGNOSIS — E559 Vitamin D deficiency, unspecified: Secondary | ICD-10-CM

## 2023-09-27 DIAGNOSIS — E88819 Insulin resistance, unspecified: Secondary | ICD-10-CM

## 2023-09-27 DIAGNOSIS — E782 Mixed hyperlipidemia: Secondary | ICD-10-CM

## 2023-09-27 DIAGNOSIS — G8929 Other chronic pain: Secondary | ICD-10-CM

## 2023-09-27 DIAGNOSIS — R7309 Other abnormal glucose: Secondary | ICD-10-CM

## 2023-09-27 DIAGNOSIS — B3731 Acute candidiasis of vulva and vagina: Secondary | ICD-10-CM

## 2023-09-27 DIAGNOSIS — L83 Acanthosis nigricans: Secondary | ICD-10-CM | POA: Insufficient documentation

## 2023-09-27 DIAGNOSIS — I10 Essential (primary) hypertension: Secondary | ICD-10-CM | POA: Diagnosis not present

## 2023-09-27 DIAGNOSIS — J302 Other seasonal allergic rhinitis: Secondary | ICD-10-CM

## 2023-09-27 DIAGNOSIS — M25561 Pain in right knee: Secondary | ICD-10-CM

## 2023-09-27 MED ORDER — LOSARTAN POTASSIUM-HCTZ 100-25 MG PO TABS
1.0000 | ORAL_TABLET | Freq: Every day | ORAL | 0 refills | Status: DC
Start: 2023-09-27 — End: 2024-02-14

## 2023-09-27 MED ORDER — DICLOFENAC EPOLAMINE 1.3 % EX PTCH: 1.0000 | MEDICATED_PATCH | Freq: Two times a day (BID) | CUTANEOUS | 0 refills | Status: DC

## 2023-09-27 MED ORDER — ZEPBOUND 2.5 MG/0.5ML ~~LOC~~ SOLN: 5.0000 mg | SUBCUTANEOUS | 2 refills | Status: DC

## 2023-09-27 MED ORDER — FLUCONAZOLE 150 MG PO TABS: 150.0000 mg | ORAL_TABLET | Freq: Once | ORAL | 0 refills | Status: AC

## 2023-09-27 MED ORDER — CETIRIZINE HCL 10 MG PO TABS: 10.0000 mg | ORAL_TABLET | Freq: Every morning | ORAL | 2 refills | Status: DC
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
## 2023-09-27 NOTE — Patient Instructions (Addendum)
 We have ordered a home sleep study from [facility]. They should contact you, however if you have not heard anything within 5 business days, you can contact them directly at [phone].  I have refilled your Zepbound at the 5mg  weekly dose. This should help with insulin resistance and weight loss.   I have refilled your blood pressure medication. Continue taking daily.  Your labs were drawn today and will be available on Mychart.  We will draw Vit D - it has been elevating thus you may not need additional supplementation.  Continue to use flector patch to knees as needed for pain.   Take diflucan only if symptoms of vaginal yeast infection occur.  Please schedule a 6 month follow up/ annual exam.

## 2023-09-28 LAB — COMPREHENSIVE METABOLIC PANEL
ALT: 17 U/L (ref 0–35)
AST: 27 U/L (ref 0–37)
Albumin: 4.6 g/dL (ref 3.5–5.2)
Alkaline Phosphatase: 60 U/L (ref 39–117)
BUN: 12 mg/dL (ref 6–23)
CO2: 31 meq/L (ref 19–32)
Calcium: 9.8 mg/dL (ref 8.4–10.5)
Chloride: 99 meq/L (ref 96–112)
Creatinine, Ser: 0.86 mg/dL (ref 0.40–1.20)
GFR: 77.07 mL/min (ref >60.00)
Glucose, Bld: 82 mg/dL (ref 70–99)
Potassium: 4.1 meq/L (ref 3.5–5.1)
Sodium: 139 meq/L (ref 135–145)
Total Bilirubin: 0.7 mg/dL (ref 0.2–1.2)
Total Protein: 7.5 g/dL (ref 6.0–8.3)

## 2023-09-28 LAB — CBC WITH DIFFERENTIAL/PLATELET
Basophils Absolute: 0.1 10*3/uL (ref 0.0–0.1)
Basophils Relative: 1.1 % (ref 0.0–3.0)
Eosinophils Absolute: 0.1 10*3/uL (ref 0.0–0.7)
Eosinophils Relative: 1.1 % (ref 0.0–5.0)
HCT: 43.3 % (ref 36.0–46.0)
Hemoglobin: 14.2 g/dL (ref 12.0–15.0)
Lymphocytes Relative: 42.5 % (ref 12.0–46.0)
Lymphs Abs: 3 10*3/uL (ref 0.7–4.0)
MCHC: 32.8 g/dL (ref 30.0–36.0)
MCV: 97.2 fl (ref 78.0–100.0)
Monocytes Absolute: 0.6 10*3/uL (ref 0.1–1.0)
Monocytes Relative: 8.4 % (ref 3.0–12.0)
Neutro Abs: 3.3 10*3/uL (ref 1.4–7.7)
Neutrophils Relative %: 46.9 % (ref 43.0–77.0)
Platelets: 292 10*3/uL (ref 150.0–400.0)
RBC: 4.45 Mil/uL (ref 3.87–5.11)
RDW: 14.9 % (ref 11.5–15.5)
WBC: 7.1 10*3/uL (ref 4.0–10.5)

## 2023-09-28 LAB — VITAMIN D 25 HYDROXY (VIT D DEFICIENCY, FRACTURES): VITD: 73.45 ng/mL (ref 30.00–100.00)

## 2023-09-28 LAB — B12 AND FOLATE PANEL
Folate: 20.5 ng/mL (ref >5.9)
Vitamin B-12: 313 pg/mL (ref 211–911)

## 2023-09-28 LAB — LIPID PANEL
Cholesterol: 206 mg/dL — ABNORMAL HIGH (ref 0–200)
HDL: 65.5 mg/dL (ref >39.00)
LDL Cholesterol: 117 mg/dL — ABNORMAL HIGH (ref 0–99)
NonHDL: 140.16
Total CHOL/HDL Ratio: 3
Triglycerides: 115 mg/dL (ref 0.0–149.0)
VLDL: 23 mg/dL (ref 0.0–40.0)

## 2023-09-28 LAB — HEMOGLOBIN A1C: Hgb A1c MFr Bld: 5.8 % (ref 4.6–6.5)

## 2023-09-28 LAB — TSH: TSH: 1.61 u[IU]/mL (ref 0.35–5.50)

## 2023-09-29 ENCOUNTER — Encounter: Payer: Self-pay | Admitting: Urgent Care

## 2023-10-05 ENCOUNTER — Encounter: Payer: Self-pay | Admitting: Obstetrics and Gynecology

## 2023-10-05 ENCOUNTER — Ambulatory Visit (INDEPENDENT_AMBULATORY_CARE_PROVIDER_SITE_OTHER): Payer: Self-pay | Admitting: Obstetrics and Gynecology

## 2023-10-05 VITALS — BP 138/84 | Wt 314.0 lb

## 2023-10-05 DIAGNOSIS — N762 Acute vulvitis: Secondary | ICD-10-CM

## 2023-10-05 DIAGNOSIS — N760 Acute vaginitis: Secondary | ICD-10-CM

## 2023-10-05 DIAGNOSIS — Z87440 Personal history of urinary (tract) infections: Secondary | ICD-10-CM | POA: Diagnosis not present

## 2023-10-05 DIAGNOSIS — R300 Dysuria: Secondary | ICD-10-CM

## 2023-10-05 DIAGNOSIS — B9689 Other specified bacterial agents as the cause of diseases classified elsewhere: Secondary | ICD-10-CM

## 2023-10-05 LAB — WET PREP FOR TRICH, YEAST, CLUE

## 2023-10-05 LAB — URINALYSIS, COMPLETE W/RFL CULTURE
Bacteria, UA: NONE SEEN /HPF
Bilirubin Urine: NEGATIVE
Glucose, UA: NEGATIVE
Hgb urine dipstick: NEGATIVE
Hyaline Cast: NONE SEEN /LPF
Ketones, ur: NEGATIVE
Leukocyte Esterase: NEGATIVE
Nitrites, Initial: NEGATIVE
Protein, ur: NEGATIVE
RBC / HPF: NONE SEEN /HPF (ref 0–2)
Specific Gravity, Urine: 1.01 (ref 1.001–1.035)
WBC, UA: NONE SEEN /HPF (ref 0–5)
pH: 7 (ref 5.0–8.0)

## 2023-10-05 LAB — NO CULTURE INDICATED

## 2023-10-05 MED ORDER — ESTRADIOL 0.1 MG/GM VA CREA
TOPICAL_CREAM | VAGINAL | 0 refills | Status: DC
Start: 2023-10-05 — End: 2024-03-10

## 2023-10-05 MED ORDER — METRONIDAZOLE 500 MG PO TABS: 500.0000 mg | ORAL_TABLET | Freq: Two times a day (BID) | ORAL | 0 refills | Status: DC

## 2023-10-05 MED ORDER — FLUCONAZOLE 150 MG PO TABS: 150.0000 mg | ORAL_TABLET | Freq: Once | ORAL | 0 refills | Status: AC
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
## 2023-10-05 NOTE — Patient Instructions (Signed)

## 2023-10-25 NOTE — Telephone Encounter (Signed)
 Can you check [facility] portal to see if we have the OSA results yet? thanks

## 2023-11-02 NOTE — Telephone Encounter (Signed)
 Please assist

## 2023-11-03 ENCOUNTER — Other Ambulatory Visit (HOSPITAL_COMMUNITY): Payer: Self-pay

## 2023-11-03 ENCOUNTER — Telehealth: Payer: Self-pay

## 2023-11-03 NOTE — Telephone Encounter (Signed)
 Pharmacy Patient Advocate Encounter   Received notification from Patient Advice Request messages that prior authorization for Zebound 2.5mg /0.38ml is required/requested.   Insurance verification completed.   The patient is insured through [facility] .   Per test claim: PA required; PA submitted to above mentioned insurance via CoverMyMeds Key/confirmation #/EOC [id number] Status is pending

## 2023-11-03 NOTE — Telephone Encounter (Signed)
 Pharmacy Patient Advocate Encounter  Received notification from [facility] that Prior Authorization for Zepbound  2.5mg /0.58ml has been  resolved, no additional PA is required.   Per test claim, Zepbound  copay is $1,035.19 for a 28 day supply.

## 2023-11-03 NOTE — Telephone Encounter (Signed)
 FYI has been reviewed.

## 2023-11-04 ENCOUNTER — Telehealth: Payer: Self-pay

## 2023-11-04 NOTE — Telephone Encounter (Signed)
 error

## 2023-11-04 NOTE — Telephone Encounter (Signed)
 Pt advised of PA not required and copay price. Pt states she has been on this medication for 6 months prescribed by her previous Dr. and has been paying $349. She has been using Dance movement psychotherapist. Does this make a difference in pricing? She also has a Zepbound  coupon that she uses with this pharmacy.

## 2023-11-08 ENCOUNTER — Other Ambulatory Visit (HOSPITAL_COMMUNITY): Payer: Self-pay

## 2023-11-08 ENCOUNTER — Other Ambulatory Visit: Payer: Self-pay | Admitting: Urgent Care

## 2023-11-08 DIAGNOSIS — G4733 Obstructive sleep apnea (adult) (pediatric): Secondary | ICD-10-CM

## 2023-11-08 MED ORDER — ZEPBOUND 5 MG/0.5ML ~~LOC~~ SOAJ: 5.0000 mg | SUBCUTANEOUS | 0 refills | Status: DC

## 2023-11-08 NOTE — Progress Notes (Signed)
 Pharmacy requested new script for higher dose - sent in for patient. DC 2.5mg  dose.

## 2023-11-09 ENCOUNTER — Encounter: Payer: Self-pay | Admitting: Urgent Care

## 2023-11-09 DIAGNOSIS — G4733 Obstructive sleep apnea (adult) (pediatric): Secondary | ICD-10-CM

## 2023-11-09 MED ORDER — ZEPBOUND 5 MG/0.5ML ~~LOC~~ SOAJ: 5.0000 mg | SUBCUTANEOUS | 0 refills | Status: DC

## 2023-11-09 MED ORDER — ZEPBOUND 7.5 MG/0.5ML ~~LOC~~ SOAJ: 7.5000 mg | SUBCUTANEOUS | 0 refills | Status: DC

## 2023-11-09 NOTE — Telephone Encounter (Signed)
 I have re-sent this to [facility]

## 2023-11-10 ENCOUNTER — Other Ambulatory Visit (HOSPITAL_COMMUNITY): Payer: Self-pay

## 2023-11-10 MED ORDER — ZEPBOUND 5 MG/0.5ML ~~LOC~~ SOLN: 5.0000 mg | SUBCUTANEOUS | 2 refills | Status: DC

## 2023-11-16 ENCOUNTER — Ambulatory Visit: Payer: Self-pay | Admitting: Nurse Practitioner

## 2023-12-06 ENCOUNTER — Encounter: Payer: Self-pay | Admitting: Urgent Care

## 2023-12-06 DIAGNOSIS — Z2989 Encounter for other specified prophylactic measures: Secondary | ICD-10-CM

## 2023-12-06 DIAGNOSIS — G8929 Other chronic pain: Secondary | ICD-10-CM

## 2023-12-06 DIAGNOSIS — K120 Recurrent oral aphthae: Secondary | ICD-10-CM

## 2023-12-06 DIAGNOSIS — F419 Anxiety disorder, unspecified: Secondary | ICD-10-CM

## 2023-12-08 ENCOUNTER — Encounter: Payer: Self-pay | Admitting: Obstetrics and Gynecology

## 2023-12-14 MED ORDER — ALPRAZOLAM 0.5 MG PO TABS: 0.5000 mg | ORAL_TABLET | Freq: Two times a day (BID) | ORAL | 0 refills | Status: AC | PRN

## 2023-12-14 MED ORDER — CELECOXIB 200 MG PO CAPS: 200.0000 mg | ORAL_CAPSULE | Freq: Two times a day (BID) | ORAL | 0 refills | Status: AC

## 2023-12-14 MED ORDER — ACYCLOVIR 800 MG PO TABS: ORAL_TABLET | ORAL | 1 refills | Status: AC

## 2023-12-29 ENCOUNTER — Encounter: Payer: Self-pay | Admitting: Urgent Care

## 2023-12-29 MED ORDER — TIRZEPATIDE-WEIGHT MANAGEMENT 7.5 MG/0.5ML ~~LOC~~ SOLN: 7.5000 mg | SUBCUTANEOUS | 0 refills | Status: DC

## 2024-01-02 ENCOUNTER — Ambulatory Visit
Admission: EM | Admit: 2024-01-02 | Discharge: 2024-01-02 | Disposition: A | Discharge status: Home / Self Care | Attending: Family Medicine | Admitting: Family Medicine

## 2024-01-02 ENCOUNTER — Encounter (HOSPITAL_BASED_OUTPATIENT_CLINIC_OR_DEPARTMENT_OTHER): Payer: Self-pay | Admitting: Emergency Medicine

## 2024-01-02 ENCOUNTER — Emergency Department (HOSPITAL_BASED_OUTPATIENT_CLINIC_OR_DEPARTMENT_OTHER)

## 2024-01-02 ENCOUNTER — Emergency Department (HOSPITAL_BASED_OUTPATIENT_CLINIC_OR_DEPARTMENT_OTHER)
Admission: EM | Admit: 2024-01-02 | Discharge: 2024-01-02 | Disposition: A | Discharge status: Home / Self Care | Attending: Emergency Medicine | Admitting: Emergency Medicine

## 2024-01-02 ENCOUNTER — Emergency Department (HOSPITAL_BASED_OUTPATIENT_CLINIC_OR_DEPARTMENT_OTHER): Admitting: Radiology

## 2024-01-02 DIAGNOSIS — R0789 Other chest pain: Secondary | ICD-10-CM | POA: Insufficient documentation

## 2024-01-02 DIAGNOSIS — R079 Chest pain, unspecified: Secondary | ICD-10-CM | POA: Diagnosis present

## 2024-01-02 DIAGNOSIS — Z79899 Other long term (current) drug therapy: Secondary | ICD-10-CM | POA: Insufficient documentation

## 2024-01-02 DIAGNOSIS — I10 Essential (primary) hypertension: Secondary | ICD-10-CM | POA: Insufficient documentation

## 2024-01-02 LAB — BASIC METABOLIC PANEL WITH GFR
Anion gap: 9 (ref 5–15)
BUN: 12 mg/dL (ref 6–20)
CO2: 26 mmol/L (ref 22–32)
Calcium: 9.5 mg/dL (ref 8.9–10.3)
Chloride: 101 mmol/L (ref 98–111)
Creatinine, Ser: 0.93 mg/dL (ref 0.44–1.00)
GFR, Estimated: >60 mL/min (ref >60)
Glucose, Bld: 89 mg/dL (ref 70–99)
Potassium: 3.8 mmol/L (ref 3.5–5.1)
Sodium: 137 mmol/L (ref 135–145)

## 2024-01-02 LAB — TROPONIN T, HIGH SENSITIVITY
Troponin T High Sensitivity: <15 ng/L (ref <19)
Troponin T High Sensitivity: <15 ng/L (ref <19)

## 2024-01-02 LAB — CBC
HCT: 39.1 % (ref 36.0–46.0)
Hemoglobin: 12.8 g/dL (ref 12.0–15.0)
MCH: 31.1 pg (ref 26.0–34.0)
MCHC: 32.7 g/dL (ref 30.0–36.0)
MCV: 94.9 fL (ref 80.0–100.0)
Platelets: 228 10*3/uL (ref 150–400)
RBC: 4.12 MIL/uL (ref 3.87–5.11)
RDW: 13.5 % (ref 11.5–15.5)
WBC: 5.3 10*3/uL (ref 4.0–10.5)
nRBC: 0 % (ref 0.0–0.2)

## 2024-01-02 LAB — D-DIMER, QUANTITATIVE: D-Dimer, Quant: 0.55 ug{FEU}/mL — ABNORMAL HIGH (ref 0.00–0.50)

## 2024-01-02 MED ORDER — FAMOTIDINE 20 MG PO TABS: 20.0000 mg | ORAL_TABLET | Freq: Two times a day (BID) | ORAL | 0 refills | Status: DC

## 2024-01-02 MED ORDER — ALUM & MAG HYDROXIDE-SIMETH 200-200-20 MG/5ML PO SUSP
30.0000 mL | Freq: Once | ORAL | Status: AC
  Administered 2024-01-02: 30 mL via ORAL
  Filled 2024-01-02: qty 30

## 2024-01-02 MED ORDER — PANTOPRAZOLE SODIUM 40 MG IV SOLR
40.0000 mg | Freq: Once | INTRAVENOUS | Status: AC
  Administered 2024-01-02: 40 mg via INTRAVENOUS
  Filled 2024-01-02: qty 10

## 2024-01-02 MED ORDER — IOHEXOL 350 MG/ML SOLN
75.0000 mL | Freq: Once | INTRAVENOUS | Status: AC | PRN
  Administered 2024-01-02: 75 mL via INTRAVENOUS

## 2024-01-02 MED ORDER — ACETAMINOPHEN 500 MG PO TABS
1000.0000 mg | ORAL_TABLET | Freq: Once | ORAL | Status: AC
  Administered 2024-01-02: 1000 mg via ORAL
  Filled 2024-01-02: qty 2
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
## 2024-01-02 NOTE — ED Triage Notes (Signed)
 Pt reports she has some center chest pain x 2 hours ago Has some burning in chest and keeps burping    States she just lost a loved one and just did piliates

## 2024-01-02 NOTE — ED Provider Notes (Signed)
 [facility] AT [facility] Provider Note   CSN: [id number] Arrival date & time: 01/02/24  1138     Patient presents with: Chest Pain   Karimah Winquist is a 53 y.o. female with PMHx anemia, anxiety, depression, OA, HTN, HLD who presents to ED concerned for right sided chest pain since waking up this morning. Patient stating that this pain was somewhat present last night after eating spaghetti, but has been progressively getting worse. Pain gets better with belching. Pain also gets better with ambulation and moving around. Patient went to piliates this morning but still had chest pain afterwards so she sought ED care. Patient has not taken any medications for symptoms today.  Denies fever, dyspnea, cough, nausea, vomiting, diarrhea, dysuria, hematuria, hematochezia. Denies recent surgery/immobilization, hx DVT/PE, hemoptysis, hx cancer in the past 6 months, calf swelling/tenderness. Patient denies cardiac hx.      Chest Pain      Prior to Admission medications   Medication Sig Start Date End Date Taking? Authorizing Provider  famotidine (PEPCID) 20 MG tablet Take 1 tablet (20 mg total) by mouth 2 (two) times daily. 01/02/24  Yes Trooper Olander F, PA-C  acyclovir  (ZOVIRAX ) 800 MG tablet TAKE 1 TABLET BY MOUTH TWO TIMES A DAY FOR 5 DAYS AS NEEDED FOR HERPES FLARE & REPEAT 12/14/23   Crain, Whitney L, PA  ALPRAZolam  (XANAX ) 0.5 MG tablet Take 1 tablet (0.5 mg total) by mouth 2 (two) times daily as needed for anxiety. 12/14/23   Crain, Whitney L, PA  celecoxib  (CELEBREX ) 200 MG capsule Take 1 capsule (200 mg total) by mouth 2 (two) times daily with a meal. 12/14/23 01/13/24  Crain, Whitney L, PA  cetirizine  (ZYRTEC ) 10 MG tablet Take 1 tablet (10 mg total) by mouth in the morning. 09/27/23   Crain, Whitney L, PA  Cholecalciferol  (D3-50) 1.25 MG (50000 UT) capsule TAKE 1 CAPSULE ONCE A WEEK FOR VITAMIN D  DEFICIENCY 09/08/23   Webb, Padonda B, FNP  estradiol   (ESTRACE ) 0.1 MG/GM vaginal cream Place a pea size amount to the urethra 2 times a week at bedtime. 10/05/23   Cathlyn JAYSON Nikki Bobie FORBES, MD  Estradiol  (YUVAFEM ) 10 MCG TABS vaginal tablet Place 1 tablet (10 mcg total) vaginally 2 (two) times a week. 02/05/22   Chrzanowski, Jami B, NP  losartan -hydrochlorothiazide (HYZAAR) 100-25 MG tablet Take 1 tablet by mouth daily. 09/27/23   Crain, Whitney L, PA  metroNIDAZOLE  (FLAGYL ) 500 MG tablet Take 1 tablet (500 mg total) by mouth 2 (two) times daily. 10/05/23   Amundson C Silva, Brook E, MD  Multiple Vitamin (MULTIVITAMIN WITH MINERALS) TABS tablet Take 1 tablet by mouth in the morning.    [provider]  phentermine  (ADIPEX-P ) 37.5 MG tablet TAKE 1/2 TO 1 TABLET BY MOUTH EVERY MORNING FOR DIETING AND WEIGHT LOSS 09/08/23   Webb, Padonda B, FNP  Probiotic Product (PROBIOTIC BLEND PO) Take by mouth.    [provider]  tirzepatide  7.5 MG/0.5ML injection vial Inject 7.5 mg into the skin once a week. 12/29/23   Crain, Benton L, PA    Allergies: Morphine and Echinacea-golden seal [nutritional supplements]    Review of Systems  Cardiovascular:  Positive for chest pain.    Updated Vital Signs BP (!) 164/97   Pulse 88   Temp 98 F (36.7 C) (Oral)   Resp 11   LMP 07/18/2020 (Approximate)   SpO2 100%   Physical Exam Vitals and nursing note reviewed.  Constitutional:  General: She is not in acute distress.    Appearance: She is not ill-appearing, toxic-appearing or diaphoretic.  HENT:     Head: Normocephalic and atraumatic.     Mouth/Throat:     Mouth: Mucous membranes are moist.     Pharynx: No oropharyngeal exudate or posterior oropharyngeal erythema.   Eyes:     General: No scleral icterus.       Right eye: No discharge.        Left eye: No discharge.     Conjunctiva/sclera: Conjunctivae normal.    Cardiovascular:     Rate and Rhythm: Normal rate and regular rhythm.     Pulses: Normal pulses.     Heart sounds: Normal  heart sounds. No murmur heard. Pulmonary:     Effort: Pulmonary effort is normal. No respiratory distress.     Breath sounds: Normal breath sounds. No wheezing, rhonchi or rales.  Abdominal:     Palpations: Abdomen is soft.     Tenderness: There is no abdominal tenderness.   Musculoskeletal:     Right lower leg: No edema.     Left lower leg: No edema.     Comments: No calf swelling or tenderness to palpation   Skin:    General: Skin is warm and dry.     Findings: No rash.   Neurological:     General: No focal deficit present.     Mental Status: She is alert and oriented to person, place, and time. Mental status is at baseline.   Psychiatric:        Mood and Affect: Mood normal.        Behavior: Behavior normal.     (all labs ordered are listed, but only abnormal results are displayed) Labs Reviewed  D-DIMER, QUANTITATIVE - Abnormal; Notable for the following components:      Result Value   D-Dimer, Quant 0.55 (*)    All other components within normal limits  BASIC METABOLIC PANEL WITH GFR  CBC  TROPONIN T, HIGH SENSITIVITY  TROPONIN T, HIGH SENSITIVITY    EKG: EKG Interpretation Date/Time:  Sunday January 02 2024 11:50:31 EDT Ventricular Rate:  72 PR Interval:  166 QRS Duration:  86 QT Interval:  400 QTC Calculation: 438 R Axis:   56  Text Interpretation: Sinus rhythm Probable left atrial enlargement Baseline wander in lead(s) V5 V6 Confirmed by Lenor Hollering [phone]) on 01/02/2024 12:28:23 PM  Radiology: CT Angio Chest PE W/Cm &/Or Wo Cm Result Date: 01/02/2024 CLINICAL DATA:  Pulmonary embolism (PE) suspected, high prob. Mid chest pain. EXAM: CT ANGIOGRAPHY CHEST WITH CONTRAST TECHNIQUE: Multidetector CT imaging of the chest was performed using the standard protocol during bolus administration of intravenous contrast. Multiplanar CT image reconstructions and MIPs were obtained to evaluate the vascular anatomy. RADIATION DOSE REDUCTION: This exam was performed  according to the departmental dose-optimization program which includes automated exposure control, adjustment of the mA and/or kV according to patient size and/or use of iterative reconstruction technique. CONTRAST:  75mL OMNIPAQUE IOHEXOL 350 MG/ML SOLN COMPARISON:  None Available. FINDINGS: Cardiovascular: The quality of this exam for evaluation of pulmonary embolism is moderate. Although the bolus is relatively well timed, quantum mottle artifact from overlying soft tissues limits evaluation. No evidence of pulmonary embolism identified to the proximal segmental level. Normal heart size. No pericardial effusion. Thoracic aorta is normal in caliber with mild atherosclerotic calcification. Mediastinum/Nodes: No enlarged mediastinal, hilar, or axillary lymph nodes. Thyroid  gland, trachea, and esophagus demonstrate no significant findings.  Lungs/Pleura: Lungs are clear. No pleural effusion or pneumothorax. No suspicious pulmonary nodule. Upper Abdomen: No acute abnormality. Musculoskeletal: No acute osseous abnormality. No suspicious osseous lesion. Bulky anterior bridging osteophytes of the mid to lower thoracic spine, as can be seen with DISH. Mild-to-moderate degenerative changes of the bilateral glenohumeral joints. Os acromiale on the left. Review of the MIP images confirms the above findings. IMPRESSION: 1. Moderate quality evaluation for pulmonary embolism. No evidence of pulmonary embolism identified to the proximal segmental level. 2. No acute intrathoracic findings. 3. Mild aortic atherosclerosis. Aortic Atherosclerosis (ICD10-I70.0). Electronically Signed   By: Harrietta Sherry M.D.   On: 01/02/2024 15:18   DG Chest 2 View Result Date: 01/02/2024 CLINICAL DATA:  Right-sided chest pain for several days. EXAM: CHEST - 2 VIEW COMPARISON:  None Available. FINDINGS: The heart size and mediastinal contours are within normal limits. Both lungs are clear. The visualized skeletal structures are unremarkable.  IMPRESSION: No active cardiopulmonary disease. Electronically Signed   By: Norleen DELENA Kil M.D.   On: 01/02/2024 12:41     Procedures   Medications Ordered in the ED  alum & mag hydroxide-simeth (MAALOX/MYLANTA) 200-200-20 MG/5ML suspension 30 mL (30 mLs Oral Given 01/02/24 1311)  acetaminophen  (TYLENOL ) tablet 1,000 mg (1,000 mg Oral Given 01/02/24 1312)  iohexol (OMNIPAQUE) 350 MG/ML injection 75 mL (75 mLs Intravenous Contrast Given 01/02/24 1443)  pantoprazole (PROTONIX) injection 40 mg (40 mg Intravenous Given 01/02/24 1552)    Clinical Course as of 01/02/24 1557  Sun Jan 02, 2024  1252 DG Chest 2 View [SM]    Clinical Course User Index [SM] Hoy Nidia FALCON, [place]                                 Medical Decision Making Amount and/or Complexity of Data Reviewed Labs: ordered. Radiology: ordered. Decision-making details documented in ED Course.  Risk OTC drugs. Prescription drug management.   This patient presents to the ED for concern of chest pain, this involves an extensive number of treatment options, and is a complaint that carries with it a high risk of complications and morbidity.  The differential diagnosis includes acute coronary syndrome, congestive heart failure, pericarditis, pneumonia, pulmonary embolism, tension pneumothorax, esophageal rupture, aortic dissection, cardiac tamponade, musculoskeletal   Co morbidities that complicate the patient evaluation  anemia, anxiety, depression, OA, HTN, HLD    Additional history obtained:  Dr. Lowella PCP   Problem List / ED Course / Critical interventions / Medication management  Patient presented for right sided chest pain.  Physical exam reassuring.  Patient afebrile with stable vitals. I Ordered, and personally interpreted labs.  Age-adjusted D-dimer slightly elevated at 0.55.  CBC without leukocytosis or anemia.  BMP reassuring.  Delta troponin within normal limits. HEART score 2 (low risk) The patient was  maintained on a cardiac monitor.  I personally viewed and interpreted the EKG/cardiac monitored which showed an underlying rhythm of: Sinus rhythm. I ordered imaging studies including chest xray and CTA chest to assess for process contributing to patient's symptoms. I independently visualized and interpreted imaging which showed no acute process. I agree with the radiologist interpretation.  CTA chest was showing moderate quality evaluation for PE without signs of PE in proximal segmental level. I doubt PE is causing patient's symptoms today given that belching and ambulating makes symptoms feel better. Patient also declined SOB and CT is without signs of heart strain. Symptoms sound more consistent  with GERD vs peptic ulcer.  Patient stating that symptoms could be d/t spaghetti last night along with increased stressed recently since a friend just died from a stroke and she went to their funeral yesterday. Patient would like referral to cardiology in abundance of caution and also agrees to follow up with PCP.  I have reviewed the patients home medicines and have made adjustments as needed The patient has been appropriately medically screened and/or stabilized in the ED. I have low suspicion for any other emergent medical condition which would require further screening, evaluation or treatment in the ED or require inpatient management. At time of discharge the patient is hemodynamically stable and in no acute distress. I have discussed work-up results and diagnosis with patient and answered all questions. Patient is agreeable with discharge plan. We discussed strict return precautions for returning to the emergency department and they verbalized understanding.     Social Determinants of Health:  none      Final diagnoses:  Atypical chest pain    ED Discharge Orders          Ordered    Ambulatory referral to Cardiology       Comments: If you have not heard from the Cardiology office within the  next 72 hours please call [phone].   01/02/24 1545    famotidine (PEPCID) 20 MG tablet  2 times daily        01/02/24 1545               Hoy Nidia FALCON, [place] 01/02/24 1557    Lenor Hollering, MD 01/03/24 [phone]

## 2024-01-02 NOTE — ED Notes (Signed)
 Reviewed AVS/discharge instruction with patient. Time allotted for and all questions answered. Patient is agreeable for d/c and escorted to ed exit by staff.

## 2024-01-02 NOTE — Discharge Instructions (Addendum)
 It was a pleasure caring for you today. Please follow up with your primary care provider. Seek emergency care if experiencing any new or worsening symptoms.

## 2024-01-02 NOTE — Discharge Instructions (Signed)
 Please go to the ER for further evaluation of your symptoms

## 2024-01-02 NOTE — ED Notes (Signed)
 Patient is being discharged from the Urgent Care and sent to the Emergency Department via POV . Per provider, patient is in need of higher level of care due to poss MI. Patient is aware and verbalizes understanding of plan of care.  Vitals:   01/02/24 1101  BP: (!) 145/92  Pulse: 79  Resp: 18  Temp: 98.2 F (36.8 C)  SpO2: 98%

## 2024-01-02 NOTE — ED Triage Notes (Signed)
 Chest pain when waking, burping Went to work out and felt worse Seen at [employer] sent for [employer], denies sob  Reports eating heavy last night and increased stress ( funeral yesterday)

## 2024-01-24 ENCOUNTER — Encounter: Payer: Self-pay | Admitting: Obstetrics and Gynecology

## 2024-01-24 NOTE — Telephone Encounter (Signed)
 Per review of OV 05/04/23 with Dr. Nikki,  treatment options of HRT, SSRI/SNRI, Gabapentin.

## 2024-02-03 ENCOUNTER — Other Ambulatory Visit: Payer: Self-pay | Admitting: Urgent Care

## 2024-02-03 MED ORDER — TIRZEPATIDE-WEIGHT MANAGEMENT 7.5 MG/0.5ML ~~LOC~~ SOLN: 7.5000 mg | SUBCUTANEOUS | 5 refills | Status: DC

## 2024-02-03 NOTE — Progress Notes (Signed)
 Refill request for zepbound  7.5mg  received from [facility] pharmacy. Sent in

## 2024-02-08 NOTE — Telephone Encounter (Signed)
 MyChart message not read.   Call placed to patient, left detailed message advising of available appt on 8/6 at 1000. Requested return call by end of business day to confirm. Or reply via mychart.

## 2024-02-09 ENCOUNTER — Ambulatory Visit: Admitting: Obstetrics and Gynecology

## 2024-02-14 ENCOUNTER — Encounter: Payer: Self-pay | Admitting: Urgent Care

## 2024-02-14 DIAGNOSIS — I10 Essential (primary) hypertension: Secondary | ICD-10-CM

## 2024-02-14 MED ORDER — LOSARTAN POTASSIUM-HCTZ 100-25 MG PO TABS: 1.0000 | ORAL_TABLET | Freq: Every day | ORAL | 0 refills | Status: DC

## 2024-02-14 NOTE — Telephone Encounter (Signed)
 Requesting rx rf of losartan  / hydrochlorothiazide Last written 09/27/2023 Last OV - 09/27/2023 [place] No upcoming appt

## 2024-02-14 NOTE — Telephone Encounter (Signed)
 I have approved the refill, but pt has no follow up scheduled with me. I need to see her back in about 2 months

## 2024-03-02 ENCOUNTER — Telehealth: Admitting: Physician Assistant

## 2024-03-02 DIAGNOSIS — U071 COVID-19: Secondary | ICD-10-CM

## 2024-03-03 NOTE — Progress Notes (Signed)
   Thank you for the details you included in the comment boxes. Those details are very helpful in determining the best course of treatment for you and help us  to provide the best care. Because you are Covid positive, we recommend that you schedule a Virtual Urgent Care video visit in order for the provider to better assess what is going on.  The provider will be able to give you a more accurate diagnosis and treatment plan if we can more freely discuss your symptoms and with the addition of a virtual examination.   If you change your visit to a video visit, we will bill your insurance (similar to an office visit) and you will not be charged for this e-Visit. You will be able to stay at home and speak with the first available [facility] advanced practice provider. The link to do a video visit is in the drop down Menu tab of your Welcome screen in MyChart.      I have spent 5 minutes in review of e-visit questionnaire, review and updating patient chart, medical decision making and response to patient.   Angelia Kelp, PA-C

## 2024-03-10 ENCOUNTER — Encounter: Payer: Self-pay | Admitting: Urgent Care

## 2024-03-10 ENCOUNTER — Ambulatory Visit: Admitting: Urgent Care

## 2024-03-10 VITALS — BP 130/83 | HR 78 | Ht 65.0 in | Wt 322.0 lb

## 2024-03-10 DIAGNOSIS — E538 Deficiency of other specified B group vitamins: Secondary | ICD-10-CM | POA: Diagnosis not present

## 2024-03-10 DIAGNOSIS — Z634 Disappearance and death of family member: Secondary | ICD-10-CM | POA: Diagnosis not present

## 2024-03-10 DIAGNOSIS — R5383 Other fatigue: Secondary | ICD-10-CM | POA: Diagnosis not present

## 2024-03-10 MED ORDER — TIRZEPATIDE-WEIGHT MANAGEMENT 10 MG/0.5ML ~~LOC~~ SOLN: 10.0000 mg | SUBCUTANEOUS | 5 refills | Status: DC

## 2024-03-10 MED ORDER — BUPROPION HCL ER (XL) 150 MG PO TB24: 150.0000 mg | ORAL_TABLET | ORAL | 1 refills | Status: DC

## 2024-03-10 MED ORDER — B-12 1000 MCG SL SUBL: 1.0000 | SUBLINGUAL_TABLET | Freq: Every day | SUBLINGUAL | 1 refills | Status: AC
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
## 2024-03-10 NOTE — Patient Instructions (Addendum)
 Please re-start Wellbutrin  to help with your bereavement. Take this once daily.  Please start B12 SUBLINGUAL tablets - this will dissolve under your tongue once daily.  If you develop muscle pain with viruses, OTC Oscillococcinum is an excellent medication (natural) to help with fatigue and muscle pain.   Please schedule a 10 week follow up.

## 2024-04-03 ENCOUNTER — Ambulatory Visit (INDEPENDENT_AMBULATORY_CARE_PROVIDER_SITE_OTHER): Admitting: Obstetrics and Gynecology

## 2024-04-03 ENCOUNTER — Ambulatory Visit: Admitting: Obstetrics and Gynecology

## 2024-04-03 ENCOUNTER — Encounter: Payer: Self-pay | Admitting: Obstetrics and Gynecology

## 2024-04-03 VITALS — BP 124/82 | HR 88

## 2024-04-03 DIAGNOSIS — N951 Menopausal and female climacteric states: Secondary | ICD-10-CM

## 2024-04-03 DIAGNOSIS — Z634 Disappearance and death of family member: Secondary | ICD-10-CM

## 2024-04-03 MED ORDER — ESTRADIOL 0.05 MG/24HR TD PTTW: 1.0000 | MEDICATED_PATCH | TRANSDERMAL | 0 refills | Status: DC

## 2024-04-03 NOTE — Patient Instructions (Signed)
 Estradiol  Skin Patches What is this medication? ESTRADIOL  (es tra DYE ole) reduces the number and severity of hot flashes due to menopause. It may also help relieve the symptoms of menopause, such as vaginal irritation, dryness, or pain during sex. It can also be used to prevent osteoporosis after menopause. It works by increasing levels of the hormone estrogen in the body. This medication is an estrogen hormone. This medicine may be used for other purposes; ask your health care provider or pharmacist if you have questions. COMMON BRAND NAME(S): Alora , Climara , DOTTI , Esclim , Estraderm , FemPatch , LYLLANA , Menostar , Minivelle , Vivelle , Vivelle -Dot What should I tell my care team before I take this medication? They need to know if you have any of these conditions: Abnormal vaginal bleeding Blood vessel disease or blood clots Breast, cervical, endometrial, ovarian, liver, or uterine cancer Dementia Diabetes (high blood sugar) Gallbladder disease Heart disease or recent heart attack High blood pressure High cholesterol High levels of calcium in the blood Hysterectomy Kidney disease Liver disease Low thyroid  levels Lupus Migraine headaches Protein C/S deficiency Stroke Tobacco use An unusual or allergic reaction to estrogens, other medications, foods, dyes, or preservatives Pregnant or trying to get pregnant Breast-feeding How should I use this medication? This medication is for external use only. Use it as directed on the prescription label. Apply the patch, sticky side to the skin, to an area that is clean, dry and hairless. Do not cut or trim the patch. Do not wear more than 1 patch at a time. Remove the old patch before using a new patch. Use a different site each time to prevent skin irritation. Keep using it unless your care team tells you to stop. This medication comes with INSTRUCTIONS FOR USE. Ask your pharmacist for directions on how to use this medication. Read the information  carefully. Talk to your pharmacist or care team if you have questions. A patient package insert for the product will be given with each prescription and refill. Be sure to read this information carefully each time. Talk to your care team about the use of this medication in children. Special care may be needed. Overdosage: If you think you have taken too much of this medicine contact a poison control center or emergency room at once. NOTE: This medicine is only for you. Do not share this medicine with others. What if I miss a dose? If you miss a dose, apply it as soon as you can. If it is almost time for your next dose, apply only that dose. Do not apply double or extra doses. What may interact with this medication? Do not take this medication with any of the following: Aromatase inhibitors like aminoglutethimide, anastrozole, exemestane, letrozole, testolactone This medication may also interact with the following: Carbamazepine Certain antibiotics like erythromycin or clarithromycin Certain antiviral medications for HIV or hepatitis Certain medications for fungal infections like ketoconazole, itraconazole, or posaconazole Medications for fungus infections like itraconazole and ketoconazole Phenobarbital Raloxifene Rifampin St. John's Wort Tamoxifen This list may not describe all possible interactions. Give your health care provider a list of all the medicines, herbs, non-prescription drugs, or dietary supplements you use. Also tell them if you smoke, drink alcohol, or use illegal drugs. Some items may interact with your medicine. What should I watch for while using this medication? Visit your care team for regular checks on your progress. You will need a regular breast and pelvic exam and Pap smear while on this medication. You should also discuss the need for regular mammograms  with your care team, and follow his or her guidelines for these tests. This medication can make your body retain  fluid, making your fingers, hands, or ankles swell. Your blood pressure can go up. Contact your care team if you feel you are retaining fluid. If you have any reason to think you are pregnant, stop taking this medication right away and contact your care team. Smoking tobacco increases the risk of getting a blood clot or having a stroke while you are taking this medication, especially if you are older than 35 years. If you wear contact lenses and notice visual changes, or if the lenses begin to feel uncomfortable, consult your eye care specialist. This medication can increase the risk of developing a condition (endometrial hyperplasia) that may lead to cancer of the lining of the uterus. Taking progestins, another hormone medication, with this medication lowers the risk of developing this condition. Therefore, if your uterus has not been removed (by a hysterectomy), your care team may prescribe a progestin for you to take together with your estrogen. You should know, however, that taking estrogens with progestins may have additional health risks. You should discuss the use of estrogens and progestins with your care team to determine the benefits and risks for you. If you are going to need surgery, an MRI, CT scan, or other procedure, tell your care team that you are using this medication. You may need to remove the patch before the procedure. Contact with water while you are swimming, using a sauna, bathing, or showering may cause the patch to fall off. If your patch falls off reapply it. If you cannot reapply the patch, apply a new patch to another area and continue to follow your usual dose schedule. What side effects may I notice from receiving this medication? Side effects that you should report to your care team as soon as possible: Allergic reactions--skin rash, itching, hives, swelling of the face, lips, tongue, or throat Blood clot--pain, swelling, or warmth in the leg, shortness of breath, chest  pain Breast tissue changes, new lumps, redness, pain, or discharge from the nipple Gallbladder problems--severe stomach pain, nausea, vomiting, fever Increase in blood pressure Liver injury--right upper belly pain, loss of appetite, nausea, light-colored stool, dark yellow or brown urine, yellowing skin or eyes, unusual weakness or fatigue Stroke--sudden numbness or weakness of the face, arm, or leg, trouble speaking, confusion, trouble walking, loss of balance or coordination, dizziness, severe headache, change in vision Unusual vaginal discharge, itching, or odor Vaginal bleeding after menopause, pelvic pain Side effects that usually do not require medical attention (report to your care team if they continue or are bothersome): Bloating Breast pain or tenderness Hair loss Nausea Stomach pain Vomiting This list may not describe all possible side effects. Call your doctor for medical advice about side effects. You may report side effects to FDA at 1-800-FDA-1088. Where should I keep my medication? Keep out of the reach of children and pets. Store at room temperature between 20 and 25 degrees C (68 and 77 degrees F). Keep this medication in the original pouch until you are ready to use it. Get rid of any unused medication after the expiration date. Get rid of used patches properly. Since used patches may still contain active medication, fold the patch in half so that it sticks to itself before throwing it away. Put it in the trash where children and pets cannot reach it. It is important to get rid of the medication as soon as you  no longer need it, or it is expired. You can do this in two ways: Take the medication to a medication take-back program. Check with your pharmacy or law enforcement to find a location. If you cannot return the medication, ask your pharmacist or care team how to get rid of this medication safely. NOTE: This sheet is a summary. It may not cover all possible information.  If you have questions about this medicine, talk to your doctor, pharmacist, or health care provider.  2024 Elsevier/Gold Standard (2021-02-28 00:00:00)
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
## 2024-04-04 MED ORDER — ZEPBOUND 12.5 MG/0.5ML ~~LOC~~ SOLN: 12.5000 mg | SUBCUTANEOUS | 2 refills | Status: DC

## 2024-04-04 NOTE — Telephone Encounter (Signed)
 Patient requesting rx rf of Zepbound  with strength increase  Last written as 10mg  on 03/10/2024 Last OV 09/052025 Upcoming appt 05/26/2024

## 2024-04-04 NOTE — Addendum Note (Signed)
 Addended by: Raguel Kosloski on: 04/04/2024 11:32 AM   Modules accepted: Orders

## 2024-04-13 ENCOUNTER — Encounter: Payer: Self-pay | Admitting: Urgent Care

## 2024-04-13 ENCOUNTER — Ambulatory Visit: Admitting: Family Medicine

## 2024-04-13 ENCOUNTER — Encounter: Payer: Self-pay | Admitting: Family Medicine

## 2024-04-13 VITALS — BP 130/89 | HR 92 | Ht 65.0 in | Wt 324.0 lb

## 2024-04-13 DIAGNOSIS — R300 Dysuria: Secondary | ICD-10-CM

## 2024-04-13 DIAGNOSIS — R350 Frequency of micturition: Secondary | ICD-10-CM

## 2024-04-13 DIAGNOSIS — N3000 Acute cystitis without hematuria: Secondary | ICD-10-CM

## 2024-04-13 DIAGNOSIS — N390 Urinary tract infection, site not specified: Secondary | ICD-10-CM

## 2024-04-13 LAB — POCT URINALYSIS DIP (CLINITEK)
Blood, UA: NEGATIVE
Glucose, UA: 100 mg/dL — AB
Nitrite, UA: POSITIVE — AB
POC PROTEIN,UA: 100 — AB
Spec Grav, UA: 1.015 (ref 1.010–1.025)
Urobilinogen, UA: 4 U/dL — AB
pH, UA: 5 (ref 5.0–8.0)

## 2024-04-13 MED ORDER — NITROFURANTOIN MONOHYD MACRO 100 MG PO CAPS: 100.0000 mg | ORAL_CAPSULE | Freq: Two times a day (BID) | ORAL | 0 refills | Status: DC

## 2024-04-13 NOTE — Progress Notes (Signed)
 Acute Office Visit  Subjective:     Patient ID: Mary Banks, female    DOB: [birth year], 53 y.o.   MRN: [id number]  Chief Complaint  Patient presents with   Urinary Frequency    HPI Patient is in today for dysuria and frequency that started about 2 days ago.  In fact Mary Banks said last night Mary Banks got up 8 times to pee.  Mary Banks has not seen any blood in her urine.  Mary Banks says Mary Banks has been getting more frequent UTIs since Mary Banks had her hysterectomy back in 2022.  Mary Banks did have a little bit of low back pain with that last night Mary Banks said Mary Banks finally took some Azo this morning and got a little temporary relief.  ROS      Objective:    BP 130/89   Pulse 92   Ht 5' 5 (1.651 m)   Wt (!) 324 lb 0.6 oz (147 kg)   LMP 07/18/2020 (Approximate)   SpO2 95%   BMI 53.92 kg/m    Physical Exam Vitals reviewed.  Constitutional:      Appearance: Normal appearance.  HENT:     Head: Normocephalic.  Pulmonary:     Effort: Pulmonary effort is normal.  Neurological:     Mental Status: Mary Banks is alert and oriented to person, place, and time.  Psychiatric:        Mood and Affect: Mood normal.        Behavior: Behavior normal.     Results for orders placed or performed in visit on 04/13/24  POCT URINALYSIS DIP (CLINITEK)  Result Value Ref Range   Color, UA orange (A) yellow   Clarity, UA clear clear   Glucose, UA =100 (A) negative mg/dL   Bilirubin, UA small (A) negative   Ketones, POC UA small (15) (A) negative mg/dL   Spec Grav, UA 8.984 8.989 - 1.025   Blood, UA negative negative   pH, UA 5.0 5.0 - 8.0   POC PROTEIN,UA =100 (A) negative, trace   Urobilinogen, UA 4.0 (A) 0.2 or 1.0 E.U./dL   Nitrite, UA Positive (A) Negative   Leukocytes, UA Large (3+) (A) Negative        Assessment & Plan:   Problem List Items Addressed This Visit   None Visit Diagnoses       Urinary frequency    -  Primary   Relevant Medications   nitrofurantoin , macrocrystal-monohydrate, (MACROBID ) 100  MG capsule   Other Relevant Orders   POCT URINALYSIS DIP (CLINITEK) (Completed)   Urine Culture     Burning with urination       Relevant Medications   nitrofurantoin , macrocrystal-monohydrate, (MACROBID ) 100 MG capsule   Other Relevant Orders   POCT URINALYSIS DIP (CLINITEK) (Completed)   Urine Culture     Acute cystitis without hematuria           Urinary tract infection-urinalysis positive for nitrites and leukocytes Mary Banks did actually have glucose in her urine but Mary Banks just had a recent A1c a few months ago which was normal at 5.1 and Mary Banks is not on any medications that should be causing any glucose dumping.  But Mary Banks is on Azo.  Will go ahead and treat with nitrofurantoin .  Will send culture to confirm antibiotic appropriateness.  Continue to hydrate well over the weekend call if any problems concerns or symptoms not resolving.  We did discuss that Mary Banks might want to consider prophylaxis with topical estrogen cream.  Mary Banks states Mary Banks has  tube that was prescribed by her OB/GYN but really just has not had a chance to start it.  Meds ordered this encounter  Medications   nitrofurantoin , macrocrystal-monohydrate, (MACROBID ) 100 MG capsule    Sig: Take 1 capsule (100 mg total) by mouth 2 (two) times daily.    Dispense:  10 capsule    Refill:  0    No follow-ups on file.  Dorothyann Byars, MD

## 2024-04-13 NOTE — Telephone Encounter (Signed)
 Patient scheduled for 3:20 today with DR. Metheney for evaluation of possible UTI=kph

## 2024-04-14 ENCOUNTER — Encounter: Payer: Self-pay | Admitting: *Deleted

## 2024-04-17 ENCOUNTER — Ambulatory Visit: Payer: Self-pay | Admitting: Family Medicine

## 2024-04-17 LAB — URINE CULTURE

## 2024-04-17 LAB — SPECIMEN STATUS REPORT

## 2024-04-17 MED ORDER — SULFAMETHOXAZOLE-TRIMETHOPRIM 800-160 MG PO TABS: 1.0000 | ORAL_TABLET | Freq: Two times a day (BID) | ORAL | 0 refills | Status: DC

## 2024-04-17 NOTE — Progress Notes (Signed)
 Hi Mary Banks, your urine culture came back showing e coli UTI.  The antibiotic I put you on may not clear the infection.  If you are still having some symptoms then please pick up the new antibiotic I ams sending for you.

## 2024-04-18 ENCOUNTER — Encounter: Payer: Self-pay | Admitting: Obstetrics and Gynecology

## 2024-04-18 ENCOUNTER — Ambulatory Visit: Attending: Cardiology | Admitting: Cardiology

## 2024-04-18 ENCOUNTER — Encounter: Payer: Self-pay | Admitting: Cardiology

## 2024-04-18 VITALS — BP 112/80 | HR 104 | Ht 65.0 in | Wt 320.0 lb

## 2024-04-18 DIAGNOSIS — R9431 Abnormal electrocardiogram [ECG] [EKG]: Secondary | ICD-10-CM

## 2024-04-18 DIAGNOSIS — Z13220 Encounter for screening for lipoid disorders: Secondary | ICD-10-CM

## 2024-04-18 DIAGNOSIS — I517 Cardiomegaly: Secondary | ICD-10-CM | POA: Diagnosis not present

## 2024-04-18 DIAGNOSIS — Z8349 Family history of other endocrine, nutritional and metabolic diseases: Secondary | ICD-10-CM

## 2024-04-18 DIAGNOSIS — I10 Essential (primary) hypertension: Secondary | ICD-10-CM
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
## 2024-04-18 NOTE — Telephone Encounter (Signed)
 Routing to Dr. Nikki to review and advise on alternative.

## 2024-04-18 NOTE — Progress Notes (Signed)
 The

## 2024-04-18 NOTE — Patient Instructions (Signed)
 Medication Instructions:  Your physician recommends that you continue on your current medications as directed. Please refer to the Current Medication list given to you today.  *If you need a refill on your cardiac medications before your next appointment, please call your pharmacy*  Lab Work: Lp(a), Amyloidosis labs  If you have labs (blood work) drawn today and your tests are completely normal, you will receive your results only by: MyChart Message (if you have MyChart) OR A paper copy in the mail If you have any lab test that is abnormal or we need to change your treatment, we will call you to review the results.  Testing/Procedures: Your physician has requested that you have an echocardiogram. Echocardiography is a painless test that uses sound waves to create images of your heart. It provides your doctor with information about the size and shape of your heart and how well your heart's chambers and valves are working. This procedure takes approximately one hour. There are no restrictions for this procedure. Please do NOT wear cologne, perfume, aftershave, or lotions (deodorant is allowed). Please arrive 15 minutes prior to your appointment time.  Please note: We ask at that you not bring children with you during ultrasound (echo/ vascular) testing. Due to room size and safety concerns, children are not allowed in the ultrasound rooms during exams. Our front office staff cannot provide observation of children in our lobby area while testing is being conducted. An adult accompanying a patient to their appointment will only be allowed in the ultrasound room at the discretion of the ultrasound technician under special circumstances. We apologize for any inconvenience.   Follow-Up: At [facility], you and your health needs are our priority.  As part of our continuing mission to provide you with exceptional heart care, our providers are all part of one team.  This team includes your  primary Cardiologist (physician) and Advanced Practice Providers or APPs (Physician Assistants and Nurse Practitioners) who all work together to provide you with the care you need, when you need it.  Your next appointment:   12 week(s) via MyChart  Provider:   Kardie Tobb, DO

## 2024-04-19 NOTE — Telephone Encounter (Signed)
 Results and recommendations seen by patient.

## 2024-04-20 ENCOUNTER — Encounter: Payer: Self-pay | Admitting: Cardiology

## 2024-04-20 LAB — KAPPA/LAMBDA LIGHT CHAINS
Ig Kappa Free Light Chain: 22 mg/L — ABNORMAL HIGH (ref 3.3–19.4)
Ig Lambda Free Light Chain: 13.6 mg/L (ref 5.7–26.3)
KAPPA/LAMBDA RATIO: 1.62 (ref 0.26–1.65)

## 2024-04-24 LAB — MULTIPLE MYELOMA PANEL, SERUM
Albumin SerPl Elph-Mcnc: 3.7 g/dL (ref 2.9–4.4)
Albumin/Glob SerPl: 1.1 (ref 0.7–1.7)
Alpha 1: 0.3 g/dL (ref 0.0–0.4)
Alpha2 Glob SerPl Elph-Mcnc: 0.7 g/dL (ref 0.4–1.0)
B-Globulin SerPl Elph-Mcnc: 1.2 g/dL (ref 0.7–1.3)
Gamma Glob SerPl Elph-Mcnc: 1.5 g/dL (ref 0.4–1.8)
Globulin, Total: 3.7 g/dL (ref 2.2–3.9)
IgA/Immunoglobulin A, Serum: 209 mg/dL (ref 87–352)
IgG (Immunoglobin G), Serum: 1486 mg/dL (ref 586–1602)
IgM (Immunoglobulin M), Srm: 40 mg/dL (ref 26–217)
Total Protein: 7.4 g/dL (ref 6.0–8.5)

## 2024-04-24 LAB — IMMUNOFIXATION, URINE

## 2024-04-24 LAB — LIPOPROTEIN A (LPA): Lipoprotein (a): 75.1 nmol/L — ABNORMAL HIGH (ref <75.0)

## 2024-04-25 ENCOUNTER — Ambulatory Visit: Payer: Self-pay | Admitting: Cardiology

## 2024-04-27 ENCOUNTER — Ambulatory Visit: Admitting: Cardiology

## 2024-05-02 ENCOUNTER — Encounter: Payer: Self-pay | Admitting: *Deleted

## 2024-05-04 ENCOUNTER — Encounter: Payer: Self-pay | Admitting: Cardiology

## 2024-05-04 ENCOUNTER — Ambulatory Visit: Attending: Cardiology | Admitting: Cardiology

## 2024-05-04 VITALS — BP 136/86 | HR 86 | Ht 65.0 in | Wt 327.2 lb

## 2024-05-04 DIAGNOSIS — E7841 Elevated Lipoprotein(a): Secondary | ICD-10-CM | POA: Diagnosis not present

## 2024-05-04 DIAGNOSIS — E782 Mixed hyperlipidemia: Secondary | ICD-10-CM

## 2024-05-04 DIAGNOSIS — I2510 Atherosclerotic heart disease of native coronary artery without angina pectoris: Secondary | ICD-10-CM

## 2024-05-04 DIAGNOSIS — Z6841 Body Mass Index (BMI) 40.0 and over, adult: Secondary | ICD-10-CM

## 2024-05-04 DIAGNOSIS — I10 Essential (primary) hypertension: Secondary | ICD-10-CM | POA: Diagnosis not present

## 2024-05-04 NOTE — Patient Instructions (Addendum)
 Medication Instructions:  Your physician recommends that you continue on your current medications as directed. Please refer to the Current Medication list given to you today.  *If you need a refill on your cardiac medications before your next appointment, please call your pharmacy*   Testing/Procedures: Dr. Sheena has ordered a CT coronary calcium score.   Test locations:  [facility] at [facility]  [facility] at [facility]  This is $99 out of pocket. Coronary CalciumScan A coronary calcium scan is an imaging test used to look for deposits of calcium and other fatty materials (plaques) in the inner lining of the blood vessels of the heart (coronary arteries). These deposits of calcium and plaques can partly clog and narrow the coronary arteries without producing any symptoms or warning signs. This puts a person at risk for a heart attack. This test can detect these deposits before symptoms develop. Tell a health care provider about: Any allergies you have. All medicines you are taking, including vitamins, herbs, eye drops, creams, and over-the-counter medicines. Any problems you or family members have had with anesthetic medicines. Any blood disorders you have. Any surgeries you have had. Any medical conditions you have. Whether you are pregnant or may be pregnant. What are the risks? Generally, this is a safe procedure. However, problems may occur, including: Harm to a pregnant woman and her unborn baby. This test involves the use of radiation. Radiation exposure can be dangerous to a pregnant woman and her unborn baby. If you are pregnant, you generally should not have this procedure done. Slight increase in the risk of cancer. This is because of the radiation involved in the test. What happens before the procedure? No preparation is needed for this procedure. What happens during  the procedure? You will undress and remove any jewelry around your neck or chest. You will put on a hospital gown. Sticky electrodes will be placed on your chest. The electrodes will be connected to an electrocardiogram (ECG) machine to record a tracing of the electrical activity of your heart. A CT scanner will take pictures of your heart. During this time, you will be asked to lie still and hold your breath for 2-3 seconds while a picture of your heart is being taken. The procedure may vary among health care providers and hospitals. What happens after the procedure? You can get dressed. You can return to your normal activities. It is up to you to get the results of your test. Ask your health care provider, or the department that is doing the test, when your results will be ready. Summary A coronary calcium scan is an imaging test used to look for deposits of calcium and other fatty materials (plaques) in the inner lining of the blood vessels of the heart (coronary arteries). Generally, this is a safe procedure. Tell your health care provider if you are pregnant or may be pregnant. No preparation is needed for this procedure. A CT scanner will take pictures of your heart. You can return to your normal activities after the scan is done. This information is not intended to replace advice given to you by your health care provider. Make sure you discuss any questions you have with your health care provider. Document Released: 12/19/2007 Document Revised: 05/11/2016 Document Reviewed: 05/11/2016 Elsevier Interactive Patient Education  2017 [employer].    Follow-Up: At [facility], you and your health needs are our priority.  As  part of our continuing mission to provide you with exceptional heart care, our providers are all part of one team.  This team includes your primary Cardiologist (physician) and Advanced Practice Providers or APPs (Physician Assistants and Nurse Practitioners)  who all work together to provide you with the care you need, when you need it.  Your next appointment:   6 month(s)  Provider:   Kardie Tobb, DO    Other instructions: Mediterranean Diet A Mediterranean diet is based on the traditions of countries on the [place]. It focuses on eating more: Fruits and vegetables. Whole grains, beans, nuts, and seeds. Heart-healthy fats. These are fats that are good for your heart. It involves eating less: Dairy. Meat and eggs. Processed foods with added sugar, salt, and fat. This type of diet can help prevent certain conditions. It can also improve outcomes if you have a long-term (chronic) disease, such as kidney or heart disease. What are tips for following this plan? Reading food labels Check packaged foods for: The serving size. For foods such as rice and pasta, the serving size is the amount of cooked product, not dry. The total fat. Avoid foods with saturated fat or trans fat. Added sugars, such as corn syrup. Shopping  Try to have a balanced diet. Buy a variety of foods, such as: Fresh fruits and vegetables. You may be able to get these from local farmers markets. You can also buy them frozen. Grains, beans, nuts, and seeds. Some of these can be bought in bulk. Fresh seafood. Poultry and eggs. Low-fat dairy products. Buy whole ingredients instead of foods that have already been packaged. If you can't get fresh seafood, buy precooked frozen shrimp or canned fish, such as tuna, salmon, or sardines. Stock your pantry so you always have certain foods on hand, such as olive oil, canned tuna, canned tomatoes, rice, pasta, and beans. Cooking Cook foods with extra-virgin olive oil instead of using butter or other vegetable oils. Have meat as a side dish. Have vegetables or grains as your main dish. This means having meat in small portions or adding small amounts of meat to foods like pasta or stew. Use beans or vegetables instead of  meat in common dishes like chili or lasagna. Try out different cooking methods. Try roasting, broiling, steaming, and sauting vegetables. Add frozen vegetables to soups, stews, pasta, or rice. Add nuts or seeds for added healthy fats and plant protein at each meal. You can add these to yogurt, salads, or vegetable dishes. Marinate fish or vegetables using olive oil, lemon juice, garlic, and fresh herbs. Meal planning Plan to eat a vegetarian meal one day each week. Try to work up to two vegetarian meals, if possible. Eat seafood two or more times a week. Have healthy snacks on hand. These may include: Vegetable sticks with hummus. Greek yogurt. Fruit and nut trail mix. Eat balanced meals. These should include: Fruit: 2-3 servings a day. Vegetables: 4-5 servings a day. Low-fat dairy: 2 servings a day. Fish, poultry, or lean meat: 1 serving a day. Beans and legumes: 2 or more servings a week. Nuts and seeds: 1-2 servings a day. Whole grains: 6-8 servings a day. Extra-virgin olive oil: 3-4 servings a day. Limit red meat and sweets to just a few servings a month. Lifestyle  Try to cook and eat meals with your family. Drink enough fluid to keep your pee (urine) pale yellow. Be active every day. This includes: Aerobic exercise, which is exercise that causes your heart  to beat faster. Examples include running and swimming. Leisure activities like gardening, walking, or housework. Get 7-8 hours of sleep each night. Drink red wine if your provider says you can. A glass of wine is 5 oz (150 mL). You may be allowed to have: Up to 1 glass a day if you're female and not pregnant. Up to 2 glasses a day if you're female. What foods should I eat? Fruits Apples. Apricots. Avocado. Berries. Bananas. Cherries. Dates. Figs. Grapes. Lemons. Melon. Oranges. Peaches. Plums. Pomegranate. Vegetables Artichokes. Beets. Broccoli. Cabbage. Carrots. Eggplant. Green beans. Chard. Kale. Spinach. Onions. Leeks.  Peas. Squash. Tomatoes. Peppers. Radishes. Grains Whole-grain pasta. Brown rice. Bulgur wheat. Polenta. Couscous. Whole-wheat bread. [place]neil Madeira. Meats and other proteins Beans. Almonds. Sunflower seeds. Pine nuts. Peanuts. Cod. Salmon. Scallops. Shrimp. Tuna. Tilapia. Clams. Oysters. Eggs. Chicken or turkey without skin. Dairy Low-fat milk. Cheese. Greek yogurt. Fats and oils Extra-virgin olive oil. Avocado oil. Grapeseed oil. Beverages Water. Red wine. Herbal tea. Sweets and desserts Greek yogurt with honey. Baked apples. Poached pears. Trail mix. Seasonings and condiments Basil. Cilantro. Coriander. Cumin. Mint. Parsley. Sage. Rosemary. Tarragon. Garlic. Oregano. Thyme. Pepper. Balsamic vinegar. Tahini. Hummus. Tomato sauce. Olives. Mushrooms. The items listed above may not be all the foods and drinks you can have. Talk to a dietitian to learn more. What foods should I limit? This is a list of foods that should be eaten rarely. Fruits Fruit canned in syrup. Vegetables Deep-fried potatoes, like French fries. Grains Packaged pasta or rice dishes. Cereal with added sugar. Snacks with added sugar. Meats and other proteins Beef. Pork. Lamb. Chicken or turkey with skin. Hot dogs. [place]. Dairy Ice cream. Sour cream. Whole milk. Fats and oils Butter. Canola oil. Vegetable oil. Beef fat (tallow). Lard. Beverages Juice. Sugar-sweetened soft drinks. Beer. Liquor and spirits. Sweets and desserts Cookies. Cakes. Pies. Candy. Seasonings and condiments Mayonnaise. Pre-made sauces and marinades. The items listed above may not be all the foods and drinks you should limit. Talk to a dietitian to learn more. Where to find more information American Heart Association (AHA): heart.org This information is not intended to replace advice given to you by your health care provider. Make sure you discuss any questions you have with your health care provider. Document Revised: 10/04/2022 Document  Reviewed: 10/04/2022 Elsevier Patient Education  2024 [employer].

## 2024-05-08 ENCOUNTER — Encounter: Payer: Self-pay | Admitting: Nurse Practitioner

## 2024-05-10 MED ORDER — CIPROFLOXACIN HCL 500 MG PO TABS: 500.0000 mg | ORAL_TABLET | Freq: Two times a day (BID) | ORAL | 0 refills | Status: AC

## 2024-05-10 NOTE — Telephone Encounter (Signed)
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
## 2024-05-13 ENCOUNTER — Other Ambulatory Visit: Payer: Self-pay | Admitting: Urgent Care

## 2024-05-13 DIAGNOSIS — I10 Essential (primary) hypertension: Secondary | ICD-10-CM

## 2024-05-16 ENCOUNTER — Encounter: Payer: Self-pay | Admitting: Nurse Practitioner

## 2024-05-17 ENCOUNTER — Encounter: Admitting: Urgent Care

## 2024-05-26 ENCOUNTER — Ambulatory Visit: Admitting: Urgent Care

## 2024-05-26 VITALS — BP 132/81 | HR 77 | Ht 65.0 in | Wt 325.0 lb

## 2024-05-26 DIAGNOSIS — N390 Urinary tract infection, site not specified: Secondary | ICD-10-CM | POA: Diagnosis not present

## 2024-05-26 DIAGNOSIS — L83 Acanthosis nigricans: Secondary | ICD-10-CM

## 2024-05-26 DIAGNOSIS — Z6841 Body Mass Index (BMI) 40.0 and over, adult: Secondary | ICD-10-CM

## 2024-05-26 DIAGNOSIS — E88819 Insulin resistance, unspecified: Secondary | ICD-10-CM

## 2024-05-26 DIAGNOSIS — E538 Deficiency of other specified B group vitamins: Secondary | ICD-10-CM

## 2024-05-26 DIAGNOSIS — R7303 Prediabetes: Secondary | ICD-10-CM

## 2024-05-26 DIAGNOSIS — I10 Essential (primary) hypertension: Secondary | ICD-10-CM | POA: Diagnosis not present

## 2024-05-26 DIAGNOSIS — G4709 Other insomnia: Secondary | ICD-10-CM

## 2024-05-26 DIAGNOSIS — J302 Other seasonal allergic rhinitis: Secondary | ICD-10-CM | POA: Diagnosis not present

## 2024-05-26 DIAGNOSIS — E782 Mixed hyperlipidemia: Secondary | ICD-10-CM

## 2024-05-26 LAB — POCT URINALYSIS DIP (CLINITEK)
Bilirubin, UA: NEGATIVE
Blood, UA: NEGATIVE
Glucose, UA: NEGATIVE mg/dL
Ketones, POC UA: NEGATIVE mg/dL
Leukocytes, UA: NEGATIVE
Nitrite, UA: NEGATIVE
POC PROTEIN,UA: NEGATIVE
Spec Grav, UA: 1.015 (ref 1.010–1.025)
Urobilinogen, UA: 0.2 U/dL
pH, UA: 7 (ref 5.0–8.0)

## 2024-05-26 MED ORDER — CETIRIZINE HCL 10 MG PO TABS: 10.0000 mg | ORAL_TABLET | Freq: Every morning | ORAL | 2 refills | Status: AC

## 2024-05-26 MED ORDER — CETIRIZINE HCL 10 MG PO TABS
10.0000 mg | ORAL_TABLET | Freq: Every morning | ORAL | 2 refills | Status: DC
Start: 2024-05-26 — End: 2024-05-26

## 2024-05-26 MED ORDER — CETIRIZINE HCL 10 MG PO TABS: 10.0000 mg | ORAL_TABLET | Freq: Every morning | ORAL | 2 refills | Status: DC

## 2024-05-26 MED ORDER — ZEPBOUND 15 MG/0.5ML ~~LOC~~ SOAJ: 15.0000 mg | SUBCUTANEOUS | 11 refills | Status: DC

## 2024-05-26 MED ORDER — LOSARTAN POTASSIUM-HCTZ 100-25 MG PO TABS: 1.0000 | ORAL_TABLET | Freq: Every day | ORAL | 0 refills | Status: AC
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
## 2024-05-26 NOTE — Patient Instructions (Signed)
 Increase your zepbound  to 15mg  weekly. Return for fasting labs next week.  Continue your BP medication as prescribed.  Return to see me in 4 months, sooner as needed

## 2024-05-27 ENCOUNTER — Encounter: Payer: Self-pay | Admitting: Urgent Care

## 2024-05-28 ENCOUNTER — Encounter: Payer: Self-pay | Admitting: Urgent Care

## 2024-05-29 ENCOUNTER — Ambulatory Visit: Payer: Self-pay | Admitting: Urgent Care

## 2024-05-29 LAB — URINE CULTURE

## 2024-05-29 MED ORDER — ZEPBOUND 15 MG/0.5ML ~~LOC~~ SOLN: 15.0000 mg | SUBCUTANEOUS | 11 refills | Status: AC

## 2024-06-07 ENCOUNTER — Ambulatory Visit (HOSPITAL_COMMUNITY)
Admission: RE | Admit: 2024-06-07 | Discharge: 2024-06-07 | Disposition: A | Payer: Self-pay | Source: Ambulatory Visit | Attending: Cardiology

## 2024-06-07 ENCOUNTER — Ambulatory Visit (HOSPITAL_COMMUNITY)
Admission: RE | Admit: 2024-06-07 | Discharge: 2024-06-07 | Disposition: A | Source: Ambulatory Visit | Attending: Cardiology | Admitting: Cardiology

## 2024-06-07 DIAGNOSIS — I517 Cardiomegaly: Secondary | ICD-10-CM | POA: Diagnosis not present

## 2024-06-07 DIAGNOSIS — R9431 Abnormal electrocardiogram [ECG] [EKG]: Secondary | ICD-10-CM | POA: Diagnosis not present

## 2024-06-07 DIAGNOSIS — I2510 Atherosclerotic heart disease of native coronary artery without angina pectoris: Secondary | ICD-10-CM

## 2024-06-07 LAB — ECHOCARDIOGRAM COMPLETE
Area-P 1/2: 3.74 cm2
S' Lateral: 2.5 cm

## 2024-06-07 MED ORDER — PERFLUTREN LIPID MICROSPHERE
1.0000 mL | INTRAVENOUS | Status: DC | PRN
  Administered 2024-06-07: 2 mL via INTRAVENOUS

## 2024-06-09 LAB — B12 AND FOLATE PANEL
Folate: 16.8 ng/mL (ref >3.0)
Vitamin B-12: 1021 pg/mL (ref 232–1245)

## 2024-06-09 LAB — CORTISOL: Cortisol: 8 ug/dL (ref 6.2–19.4)

## 2024-06-09 LAB — MAGNESIUM: Magnesium: 2.3 mg/dL (ref 1.6–2.3)

## 2024-06-09 LAB — LIPID PANEL
Chol/HDL Ratio: 3.5 ratio (ref 0.0–4.4)
Cholesterol, Total: 189 mg/dL (ref 100–199)
HDL: 54 mg/dL (ref >39)
LDL Chol Calc (NIH): 121 mg/dL — ABNORMAL HIGH (ref 0–99)
Triglycerides: 76 mg/dL (ref 0–149)
VLDL Cholesterol Cal: 14 mg/dL (ref 5–40)

## 2024-06-09 LAB — HEMOGLOBIN A1C
Est. average glucose Bld gHb Est-mCnc: 105 mg/dL
Hgb A1c MFr Bld: 5.3 % (ref 4.8–5.6)

## 2024-06-09 LAB — INSULIN, FREE AND TOTAL
Free Insulin: 22 uU/mL — ABNORMAL HIGH
Total Insulin: 24 uU/mL

## 2024-06-12 ENCOUNTER — Ambulatory Visit: Admitting: Obstetrics and Gynecology

## 2024-06-28 ENCOUNTER — Ambulatory Visit: Payer: Self-pay | Admitting: Cardiology

## 2024-07-10 ENCOUNTER — Other Ambulatory Visit: Payer: Self-pay | Admitting: Urgent Care

## 2024-07-10 DIAGNOSIS — Z1231 Encounter for screening mammogram for malignant neoplasm of breast: Secondary | ICD-10-CM

## 2024-07-11 ENCOUNTER — Ambulatory Visit

## 2024-07-13 ENCOUNTER — Other Ambulatory Visit: Payer: Self-pay | Admitting: Family

## 2024-07-13 DIAGNOSIS — E559 Vitamin D deficiency, unspecified: Secondary | ICD-10-CM

## 2024-07-13 NOTE — Telephone Encounter (Signed)
 Patient requesting rx rf of Vit D 3 Last written by outside provider  Kenney Roys on 03/052025 Last lab 09/27/2023-results= 73.45 Last OV 05/26/2024 Upcoming appt 09/22/2024

## 2024-07-17 ENCOUNTER — Ambulatory Visit
Admission: RE | Admit: 2024-07-17 | Discharge: 2024-07-17 | Disposition: A | Source: Ambulatory Visit | Attending: Urgent Care | Admitting: Urgent Care

## 2024-07-17 DIAGNOSIS — Z1231 Encounter for screening mammogram for malignant neoplasm of breast: Secondary | ICD-10-CM

## 2024-07-19 ENCOUNTER — Ambulatory Visit: Payer: Self-pay | Admitting: Urgent Care

## 2024-07-19 ENCOUNTER — Encounter: Payer: Self-pay | Admitting: Urgent Care

## 2024-07-19 DIAGNOSIS — N390 Urinary tract infection, site not specified: Secondary | ICD-10-CM | POA: Diagnosis not present

## 2024-07-20 ENCOUNTER — Encounter: Payer: Self-pay | Admitting: Cardiology

## 2024-07-20 ENCOUNTER — Ambulatory Visit: Admitting: Cardiology

## 2024-07-20 VITALS — BP 134/94 | HR 63 | Ht 65.0 in | Wt 320.2 lb

## 2024-07-20 DIAGNOSIS — I10 Essential (primary) hypertension: Secondary | ICD-10-CM | POA: Diagnosis not present

## 2024-07-20 DIAGNOSIS — I2510 Atherosclerotic heart disease of native coronary artery without angina pectoris: Secondary | ICD-10-CM | POA: Diagnosis not present

## 2024-07-20 DIAGNOSIS — E782 Mixed hyperlipidemia: Secondary | ICD-10-CM

## 2024-07-20 DIAGNOSIS — Z6841 Body Mass Index (BMI) 40.0 and over, adult: Secondary | ICD-10-CM

## 2024-07-20 DIAGNOSIS — E7841 Elevated Lipoprotein(a): Secondary | ICD-10-CM | POA: Diagnosis not present

## 2024-07-20 DIAGNOSIS — R7303 Prediabetes: Secondary | ICD-10-CM | POA: Diagnosis not present

## 2024-07-20 MED ORDER — ATORVASTATIN CALCIUM 10 MG PO TABS: 10.0000 mg | ORAL_TABLET | Freq: Every day | ORAL | 3 refills | Status: AC

## 2024-07-20 MED ORDER — SULFAMETHOXAZOLE-TRIMETHOPRIM 800-160 MG PO TABS: 1.0000 | ORAL_TABLET | Freq: Two times a day (BID) | ORAL | 0 refills | Status: AC

## 2024-07-20 NOTE — Telephone Encounter (Signed)

## 2024-07-20 NOTE — Patient Instructions (Addendum)
 Medication Instructions:  Your physician has recommended you make the following change in your medication:  START: Lipitor 10 mg once daily *If you need a refill on your cardiac medications before your next appointment, please call your pharmacy*  Lab Work: In 12 weeks: Lipids (second week of April) If you have labs (blood work) drawn today and your tests are completely normal, you will receive your results only by: MyChart Message (if you have MyChart) OR A paper copy in the mail If you have any lab test that is abnormal or we need to change your treatment, we will call you to review the results.  Follow-Up: At [facility], you and your health needs are our priority.  As part of our continuing mission to provide you with exceptional heart care, our providers are all part of one team.  This team includes your primary Cardiologist (physician) and Advanced Practice Providers or APPs (Physician Assistants and Nurse Practitioners) who all work together to provide you with the care you need, when you need it.  Your next appointment:   1 year(s) call the office in August for an appointment   Provider:   Kardie Tobb, DO    Other Instructions:
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
## 2024-07-27 MED ORDER — FLUCONAZOLE 150 MG PO TABS: 150.0000 mg | ORAL_TABLET | Freq: Once | ORAL | 3 refills | Status: AC

## 2024-07-27 NOTE — Addendum Note (Signed)
 Addended by: Rasheen Bells on: 07/27/2024 07:42 PM   Modules accepted: Orders

## 2024-09-22 ENCOUNTER — Ambulatory Visit: Admitting: Urgent Care
